# Patient Record
Sex: Female | Born: 1960 | Race: White | Hispanic: No | Smoking: Never smoker
Health system: Southern US, Community
[De-identification: ages and names within clinical notes are randomized; demographics above are authoritative.]

## PROBLEM LIST (undated history)

## (undated) DIAGNOSIS — I10 Essential (primary) hypertension: Secondary | ICD-10-CM

## (undated) DIAGNOSIS — IMO0002 Reserved for concepts with insufficient information to code with codable children: Secondary | ICD-10-CM

## (undated) DIAGNOSIS — G540 Brachial plexus disorders: Secondary | ICD-10-CM

## (undated) DIAGNOSIS — E039 Hypothyroidism, unspecified: Secondary | ICD-10-CM

## (undated) DIAGNOSIS — M797 Fibromyalgia: Secondary | ICD-10-CM

## (undated) DIAGNOSIS — R42 Dizziness and giddiness: Secondary | ICD-10-CM

## (undated) DIAGNOSIS — E538 Deficiency of other specified B group vitamins: Secondary | ICD-10-CM

## (undated) DIAGNOSIS — T7840XA Allergy, unspecified, initial encounter: Secondary | ICD-10-CM

## (undated) DIAGNOSIS — M329 Systemic lupus erythematosus, unspecified: Secondary | ICD-10-CM

## (undated) DIAGNOSIS — E069 Thyroiditis, unspecified: Secondary | ICD-10-CM

## (undated) DIAGNOSIS — R519 Headache, unspecified: Secondary | ICD-10-CM

## (undated) DIAGNOSIS — E785 Hyperlipidemia, unspecified: Secondary | ICD-10-CM

## (undated) DIAGNOSIS — K900 Celiac disease: Secondary | ICD-10-CM

## (undated) DIAGNOSIS — K649 Unspecified hemorrhoids: Secondary | ICD-10-CM

## (undated) DIAGNOSIS — B009 Herpesviral infection, unspecified: Secondary | ICD-10-CM

## (undated) DIAGNOSIS — F32A Depression, unspecified: Secondary | ICD-10-CM

## (undated) DIAGNOSIS — D649 Anemia, unspecified: Secondary | ICD-10-CM

## (undated) DIAGNOSIS — K219 Gastro-esophageal reflux disease without esophagitis: Secondary | ICD-10-CM

## (undated) DIAGNOSIS — F329 Major depressive disorder, single episode, unspecified: Secondary | ICD-10-CM

## (undated) DIAGNOSIS — M1990 Unspecified osteoarthritis, unspecified site: Secondary | ICD-10-CM

## (undated) DIAGNOSIS — S82409A Unspecified fracture of shaft of unspecified fibula, initial encounter for closed fracture: Secondary | ICD-10-CM

## (undated) DIAGNOSIS — Z8669 Personal history of other diseases of the nervous system and sense organs: Secondary | ICD-10-CM

## (undated) DIAGNOSIS — F4310 Post-traumatic stress disorder, unspecified: Secondary | ICD-10-CM

## (undated) DIAGNOSIS — F419 Anxiety disorder, unspecified: Secondary | ICD-10-CM

## (undated) DIAGNOSIS — IMO0001 Reserved for inherently not codable concepts without codable children: Secondary | ICD-10-CM

## (undated) DIAGNOSIS — I83893 Varicose veins of bilateral lower extremities with other complications: Secondary | ICD-10-CM

## (undated) HISTORY — DX: Herpesviral infection, unspecified: B00.9

## (undated) HISTORY — DX: Unspecified hemorrhoids: K64.9

## (undated) HISTORY — DX: Thyroiditis, unspecified: E06.9

## (undated) HISTORY — DX: Unspecified osteoarthritis, unspecified site: M19.90

## (undated) HISTORY — DX: Depression, unspecified: F32.A

## (undated) HISTORY — DX: Essential (primary) hypertension: I10

## (undated) HISTORY — DX: Anemia, unspecified: D64.9

## (undated) HISTORY — DX: Varicose veins of bilateral lower extremities with other complications: I83.893

## (undated) HISTORY — DX: Reserved for inherently not codable concepts without codable children: IMO0001

## (undated) HISTORY — DX: Personal history of other diseases of the nervous system and sense organs: Z86.69

## (undated) HISTORY — DX: Post-traumatic stress disorder, unspecified: F43.10

## (undated) HISTORY — PX: OTHER SURGICAL HISTORY: SHX169

## (undated) HISTORY — DX: Brachial plexus disorders: G54.0

## (undated) HISTORY — DX: Celiac disease: K90.0

## (undated) HISTORY — DX: Dizziness and giddiness: R42

## (undated) HISTORY — DX: Unspecified fracture of shaft of unspecified fibula, initial encounter for closed fracture: S82.409A

## (undated) HISTORY — DX: Major depressive disorder, single episode, unspecified: F32.9

## (undated) HISTORY — DX: Hyperlipidemia, unspecified: E78.5

## (undated) HISTORY — DX: Anxiety disorder, unspecified: F41.9

## (undated) HISTORY — DX: Deficiency of other specified B group vitamins: E53.8

## (undated) HISTORY — DX: Systemic lupus erythematosus, unspecified: M32.9

## (undated) HISTORY — DX: Allergy, unspecified, initial encounter: T78.40XA

## (undated) HISTORY — DX: Headache, unspecified: R51.9

## (undated) HISTORY — DX: Gastro-esophageal reflux disease without esophagitis: K21.9

## (undated) HISTORY — PX: NASAL SINUS SURGERY: SHX719

## (undated) HISTORY — DX: Hypothyroidism, unspecified: E03.9

## (undated) HISTORY — PX: MANDIBLE SURGERY: SHX707

## (undated) HISTORY — PX: FIBULA FRACTURE SURGERY: SHX947

## (undated) HISTORY — DX: Reserved for concepts with insufficient information to code with codable children: IMO0002

## (undated) HISTORY — DX: Fibromyalgia: M79.7

---

## 1996-07-13 HISTORY — PX: ABDOMINAL HYSTERECTOMY: SHX81

## 1997-04-12 HISTORY — PX: TOTAL VAGINAL HYSTERECTOMY: SHX2548

## 1998-08-19 ENCOUNTER — Ambulatory Visit (HOSPITAL_COMMUNITY): Admission: RE | Admit: 1998-08-19 | Discharge: 1998-08-19 | Payer: Self-pay | Admitting: Oral & Maxillofacial Surgery

## 1998-08-19 ENCOUNTER — Encounter: Payer: Self-pay | Admitting: Oral & Maxillofacial Surgery

## 1999-02-13 ENCOUNTER — Emergency Department (HOSPITAL_COMMUNITY): Admission: EM | Admit: 1999-02-13 | Discharge: 1999-02-13 | Payer: Self-pay | Admitting: Emergency Medicine

## 1999-02-13 ENCOUNTER — Encounter: Payer: Self-pay | Admitting: Emergency Medicine

## 1999-05-14 HISTORY — PX: COLONOSCOPY: SHX174

## 1999-05-28 ENCOUNTER — Other Ambulatory Visit: Admission: RE | Admit: 1999-05-28 | Discharge: 1999-05-28 | Payer: Self-pay | Admitting: Internal Medicine

## 1999-05-28 ENCOUNTER — Encounter (INDEPENDENT_AMBULATORY_CARE_PROVIDER_SITE_OTHER): Payer: Self-pay | Admitting: Specialist

## 1999-10-31 ENCOUNTER — Encounter: Admission: RE | Admit: 1999-10-31 | Discharge: 1999-10-31 | Payer: Self-pay | Admitting: Family Medicine

## 1999-10-31 ENCOUNTER — Encounter: Payer: Self-pay | Admitting: Family Medicine

## 2000-05-07 ENCOUNTER — Encounter: Admission: RE | Admit: 2000-05-07 | Discharge: 2000-05-07 | Payer: Self-pay | Admitting: Family Medicine

## 2000-05-07 ENCOUNTER — Encounter: Payer: Self-pay | Admitting: Family Medicine

## 2000-09-27 ENCOUNTER — Encounter: Payer: Self-pay | Admitting: Oral & Maxillofacial Surgery

## 2000-09-29 ENCOUNTER — Observation Stay (HOSPITAL_COMMUNITY): Admission: RE | Admit: 2000-09-29 | Discharge: 2000-09-30 | Payer: Self-pay | Admitting: Oral & Maxillofacial Surgery

## 2000-09-29 ENCOUNTER — Encounter (INDEPENDENT_AMBULATORY_CARE_PROVIDER_SITE_OTHER): Payer: Self-pay | Admitting: Specialist

## 2001-05-19 ENCOUNTER — Encounter: Payer: Self-pay | Admitting: *Deleted

## 2001-05-19 ENCOUNTER — Emergency Department (HOSPITAL_COMMUNITY): Admission: EM | Admit: 2001-05-19 | Discharge: 2001-05-19 | Payer: Self-pay | Admitting: Emergency Medicine

## 2001-08-26 ENCOUNTER — Encounter: Payer: Self-pay | Admitting: Family Medicine

## 2001-08-26 ENCOUNTER — Encounter: Admission: RE | Admit: 2001-08-26 | Discharge: 2001-08-26 | Payer: Self-pay | Admitting: Family Medicine

## 2002-07-13 HISTORY — PX: ESOPHAGOGASTRODUODENOSCOPY: SHX1529

## 2002-08-15 ENCOUNTER — Encounter: Payer: Self-pay | Admitting: Family Medicine

## 2002-08-15 ENCOUNTER — Other Ambulatory Visit: Admission: RE | Admit: 2002-08-15 | Discharge: 2002-08-15 | Payer: Self-pay | Admitting: Family Medicine

## 2002-08-28 ENCOUNTER — Encounter: Payer: Self-pay | Admitting: Family Medicine

## 2002-08-28 ENCOUNTER — Encounter: Admission: RE | Admit: 2002-08-28 | Discharge: 2002-08-28 | Payer: Self-pay | Admitting: Family Medicine

## 2003-10-04 ENCOUNTER — Encounter: Admission: RE | Admit: 2003-10-04 | Discharge: 2003-10-04 | Payer: Self-pay | Admitting: Family Medicine

## 2004-04-17 ENCOUNTER — Ambulatory Visit: Payer: Self-pay

## 2004-05-20 ENCOUNTER — Ambulatory Visit: Payer: Self-pay | Admitting: Neurology

## 2004-05-29 ENCOUNTER — Ambulatory Visit (HOSPITAL_COMMUNITY): Admission: RE | Admit: 2004-05-29 | Discharge: 2004-05-29 | Payer: Self-pay | Admitting: Neurology

## 2004-06-23 ENCOUNTER — Ambulatory Visit: Payer: Self-pay | Admitting: Internal Medicine

## 2004-12-05 ENCOUNTER — Ambulatory Visit: Payer: Self-pay | Admitting: Family Medicine

## 2005-04-08 ENCOUNTER — Ambulatory Visit: Payer: Self-pay | Admitting: Internal Medicine

## 2005-06-05 ENCOUNTER — Encounter: Admission: RE | Admit: 2005-06-05 | Discharge: 2005-06-05 | Payer: Self-pay | Admitting: Family Medicine

## 2005-07-08 ENCOUNTER — Ambulatory Visit: Payer: Self-pay | Admitting: Family Medicine

## 2005-07-15 ENCOUNTER — Ambulatory Visit: Payer: Self-pay | Admitting: Family Medicine

## 2005-08-20 ENCOUNTER — Ambulatory Visit: Payer: Self-pay | Admitting: Family Medicine

## 2005-09-11 ENCOUNTER — Ambulatory Visit: Payer: Self-pay | Admitting: Family Medicine

## 2005-09-25 ENCOUNTER — Emergency Department (HOSPITAL_COMMUNITY): Admission: EM | Admit: 2005-09-25 | Discharge: 2005-09-26 | Payer: Self-pay | Admitting: Emergency Medicine

## 2005-09-27 ENCOUNTER — Emergency Department (HOSPITAL_COMMUNITY): Admission: EM | Admit: 2005-09-27 | Discharge: 2005-09-27 | Payer: Self-pay | Admitting: Emergency Medicine

## 2005-12-02 ENCOUNTER — Ambulatory Visit: Payer: Self-pay | Admitting: Family Medicine

## 2006-02-22 ENCOUNTER — Ambulatory Visit: Payer: Self-pay | Admitting: Family Medicine

## 2006-03-19 ENCOUNTER — Ambulatory Visit: Payer: Self-pay | Admitting: Family Medicine

## 2006-03-19 LAB — CONVERTED CEMR LAB: TSH: 0.18 microintl units/mL

## 2006-05-26 ENCOUNTER — Ambulatory Visit: Payer: Self-pay | Admitting: Family Medicine

## 2006-06-15 ENCOUNTER — Encounter: Admission: RE | Admit: 2006-06-15 | Discharge: 2006-06-15 | Payer: Self-pay | Admitting: Family Medicine

## 2006-07-16 ENCOUNTER — Ambulatory Visit: Payer: Self-pay | Admitting: Family Medicine

## 2006-10-07 ENCOUNTER — Ambulatory Visit: Payer: Self-pay | Admitting: Family Medicine

## 2007-02-02 ENCOUNTER — Ambulatory Visit: Payer: Self-pay | Admitting: Family Medicine

## 2007-02-04 ENCOUNTER — Encounter: Admission: RE | Admit: 2007-02-04 | Discharge: 2007-02-04 | Payer: Self-pay | Admitting: Family Medicine

## 2007-02-15 ENCOUNTER — Ambulatory Visit: Payer: Self-pay | Admitting: Family Medicine

## 2007-02-18 LAB — CONVERTED CEMR LAB
AST: 23 units/L (ref 0–37)
Amylase: 36 units/L (ref 27–131)
Basophils Absolute: 0 10*3/uL (ref 0.0–0.1)
Bilirubin, Direct: 0.1 mg/dL (ref 0.0–0.3)
Eosinophils Relative: 0 % (ref 0.0–5.0)
HCT: 36.3 % (ref 36.0–46.0)
Hemoglobin: 12 g/dL (ref 12.0–15.0)
MCHC: 32.9 g/dL (ref 30.0–36.0)
Monocytes Absolute: 0.6 10*3/uL (ref 0.2–0.7)
Neutrophils Relative %: 54.9 % (ref 43.0–77.0)
RDW: 13.8 % (ref 11.5–14.6)
Total Bilirubin: 0.7 mg/dL (ref 0.3–1.2)
Total Protein: 6.9 g/dL (ref 6.0–8.3)
WBC: 7.4 10*3/uL (ref 4.5–10.5)

## 2007-04-28 ENCOUNTER — Ambulatory Visit: Payer: Self-pay | Admitting: Internal Medicine

## 2007-05-03 ENCOUNTER — Telehealth (INDEPENDENT_AMBULATORY_CARE_PROVIDER_SITE_OTHER): Payer: Self-pay | Admitting: Internal Medicine

## 2007-05-20 ENCOUNTER — Ambulatory Visit: Payer: Self-pay | Admitting: Family Medicine

## 2007-05-20 DIAGNOSIS — B009 Herpesviral infection, unspecified: Secondary | ICD-10-CM | POA: Insufficient documentation

## 2007-05-20 DIAGNOSIS — E785 Hyperlipidemia, unspecified: Secondary | ICD-10-CM

## 2007-05-20 DIAGNOSIS — F329 Major depressive disorder, single episode, unspecified: Secondary | ICD-10-CM

## 2007-05-20 DIAGNOSIS — K219 Gastro-esophageal reflux disease without esophagitis: Secondary | ICD-10-CM

## 2007-05-20 DIAGNOSIS — F3289 Other specified depressive episodes: Secondary | ICD-10-CM | POA: Insufficient documentation

## 2007-05-20 DIAGNOSIS — IMO0001 Reserved for inherently not codable concepts without codable children: Secondary | ICD-10-CM | POA: Insufficient documentation

## 2007-05-20 DIAGNOSIS — J45909 Unspecified asthma, uncomplicated: Secondary | ICD-10-CM

## 2007-05-20 DIAGNOSIS — K649 Unspecified hemorrhoids: Secondary | ICD-10-CM

## 2007-05-20 DIAGNOSIS — E0590 Thyrotoxicosis, unspecified without thyrotoxic crisis or storm: Secondary | ICD-10-CM

## 2007-05-20 DIAGNOSIS — E538 Deficiency of other specified B group vitamins: Secondary | ICD-10-CM | POA: Insufficient documentation

## 2007-05-20 HISTORY — DX: Herpesviral infection, unspecified: B00.9

## 2007-05-20 HISTORY — DX: Gastro-esophageal reflux disease without esophagitis: K21.9

## 2007-05-20 HISTORY — DX: Unspecified asthma, uncomplicated: J45.909

## 2007-05-20 HISTORY — DX: Unspecified hemorrhoids: K64.9

## 2007-05-20 HISTORY — DX: Thyrotoxicosis, unspecified without thyrotoxic crisis or storm: E05.90

## 2007-09-21 ENCOUNTER — Ambulatory Visit: Payer: Self-pay | Admitting: Family Medicine

## 2007-09-21 DIAGNOSIS — F411 Generalized anxiety disorder: Secondary | ICD-10-CM

## 2007-09-21 HISTORY — DX: Generalized anxiety disorder: F41.1

## 2007-12-02 ENCOUNTER — Ambulatory Visit: Payer: Self-pay | Admitting: Family Medicine

## 2007-12-06 LAB — CONVERTED CEMR LAB: ALT: 29 units/L (ref 0–35)

## 2007-12-26 ENCOUNTER — Telehealth: Payer: Self-pay | Admitting: Family Medicine

## 2008-01-02 ENCOUNTER — Encounter: Admission: RE | Admit: 2008-01-02 | Discharge: 2008-01-02 | Payer: Self-pay | Admitting: Family Medicine

## 2008-01-05 ENCOUNTER — Encounter (INDEPENDENT_AMBULATORY_CARE_PROVIDER_SITE_OTHER): Payer: Self-pay | Admitting: *Deleted

## 2008-02-07 ENCOUNTER — Telehealth (INDEPENDENT_AMBULATORY_CARE_PROVIDER_SITE_OTHER): Payer: Self-pay | Admitting: *Deleted

## 2008-03-12 ENCOUNTER — Telehealth (INDEPENDENT_AMBULATORY_CARE_PROVIDER_SITE_OTHER): Payer: Self-pay | Admitting: *Deleted

## 2008-03-13 ENCOUNTER — Telehealth (INDEPENDENT_AMBULATORY_CARE_PROVIDER_SITE_OTHER): Payer: Self-pay | Admitting: *Deleted

## 2008-06-18 ENCOUNTER — Telehealth: Payer: Self-pay | Admitting: Family Medicine

## 2008-07-10 ENCOUNTER — Telehealth: Payer: Self-pay | Admitting: Family Medicine

## 2008-07-16 ENCOUNTER — Encounter: Payer: Self-pay | Admitting: Family Medicine

## 2008-08-22 ENCOUNTER — Ambulatory Visit: Payer: Self-pay | Admitting: Family Medicine

## 2008-08-29 ENCOUNTER — Telehealth (INDEPENDENT_AMBULATORY_CARE_PROVIDER_SITE_OTHER): Payer: Self-pay | Admitting: *Deleted

## 2008-09-17 ENCOUNTER — Ambulatory Visit: Payer: Self-pay | Admitting: Family Medicine

## 2008-09-17 DIAGNOSIS — R5383 Other fatigue: Secondary | ICD-10-CM

## 2008-09-17 DIAGNOSIS — R5381 Other malaise: Secondary | ICD-10-CM

## 2008-09-19 LAB — CONVERTED CEMR LAB
ALT: 19 units/L (ref 0–35)
AST: 20 units/L (ref 0–37)
Basophils Relative: 0.1 % (ref 0.0–3.0)
Bilirubin, Direct: 0.1 mg/dL (ref 0.0–0.3)
CO2: 31 meq/L (ref 19–32)
Calcium: 9.1 mg/dL (ref 8.4–10.5)
Chloride: 103 meq/L (ref 96–112)
Creatinine, Ser: 1 mg/dL (ref 0.4–1.2)
Eosinophils Absolute: 0 10*3/uL (ref 0.0–0.7)
Eosinophils Relative: 0.1 % (ref 0.0–5.0)
Glucose, Bld: 68 mg/dL — ABNORMAL LOW (ref 70–99)
HCT: 39.7 % (ref 36.0–46.0)
Hemoglobin: 13.7 g/dL (ref 12.0–15.0)
MCV: 86.6 fL (ref 78.0–100.0)
Monocytes Absolute: 0.3 10*3/uL (ref 0.1–1.0)
Monocytes Relative: 3.5 % (ref 3.0–12.0)
RBC: 4.59 M/uL (ref 3.87–5.11)
Rhuematoid fact SerPl-aCnc: <20 intl units/mL — ABNORMAL LOW (ref 0.0–20.0)
Sed Rate: 34 mm/hr — ABNORMAL HIGH (ref 0–22)
Total Bilirubin: 0.8 mg/dL (ref 0.3–1.2)
Total Protein: 7.4 g/dL (ref 6.0–8.3)
WBC: 8.8 10*3/uL (ref 4.5–10.5)

## 2008-09-26 ENCOUNTER — Encounter: Payer: Self-pay | Admitting: Family Medicine

## 2008-10-04 ENCOUNTER — Telehealth: Payer: Self-pay | Admitting: Family Medicine

## 2008-11-06 ENCOUNTER — Telehealth: Payer: Self-pay | Admitting: Family Medicine

## 2008-11-07 ENCOUNTER — Encounter: Payer: Self-pay | Admitting: Family Medicine

## 2009-01-16 ENCOUNTER — Telehealth: Payer: Self-pay | Admitting: Family Medicine

## 2009-05-03 ENCOUNTER — Telehealth: Payer: Self-pay | Admitting: Family Medicine

## 2009-05-20 ENCOUNTER — Telehealth: Payer: Self-pay | Admitting: Family Medicine

## 2009-06-20 ENCOUNTER — Ambulatory Visit: Payer: Self-pay | Admitting: Family Medicine

## 2009-06-21 LAB — CONVERTED CEMR LAB
CO2: 30 meq/L (ref 19–32)
Chloride: 102 meq/L (ref 96–112)
Creatinine, Ser: 0.7 mg/dL (ref 0.4–1.2)
Potassium: 4 meq/L (ref 3.5–5.1)
T3, Free: 2.7 pg/mL (ref 2.3–4.2)
T4, Total: 4.5 ug/dL — ABNORMAL LOW (ref 5.0–12.5)

## 2009-07-16 ENCOUNTER — Ambulatory Visit: Payer: Self-pay | Admitting: Family Medicine

## 2009-07-16 DIAGNOSIS — E039 Hypothyroidism, unspecified: Secondary | ICD-10-CM | POA: Insufficient documentation

## 2009-07-26 ENCOUNTER — Telehealth: Payer: Self-pay | Admitting: Family Medicine

## 2009-07-31 ENCOUNTER — Encounter: Payer: Self-pay | Admitting: Family Medicine

## 2009-08-27 ENCOUNTER — Encounter: Payer: Self-pay | Admitting: Family Medicine

## 2009-09-02 ENCOUNTER — Ambulatory Visit: Payer: Self-pay | Admitting: Family Medicine

## 2009-09-05 ENCOUNTER — Encounter: Payer: Self-pay | Admitting: Family Medicine

## 2009-09-05 LAB — CONVERTED CEMR LAB
Free T4: 0.6 ng/dL (ref 0.6–1.6)
TSH: 6.61 microintl units/mL — ABNORMAL HIGH (ref 0.35–5.50)

## 2009-10-17 ENCOUNTER — Encounter (INDEPENDENT_AMBULATORY_CARE_PROVIDER_SITE_OTHER): Payer: Self-pay | Admitting: *Deleted

## 2009-10-30 ENCOUNTER — Ambulatory Visit: Payer: Self-pay | Admitting: Family Medicine

## 2009-11-27 ENCOUNTER — Telehealth: Payer: Self-pay | Admitting: Family Medicine

## 2010-07-01 ENCOUNTER — Encounter: Payer: Self-pay | Admitting: Family Medicine

## 2010-07-11 ENCOUNTER — Ambulatory Visit
Admission: RE | Admit: 2010-07-11 | Discharge: 2010-07-11 | Payer: Self-pay | Source: Home / Self Care | Attending: Family Medicine | Admitting: Family Medicine

## 2010-07-11 ENCOUNTER — Encounter: Payer: Self-pay | Admitting: Family Medicine

## 2010-07-11 DIAGNOSIS — L989 Disorder of the skin and subcutaneous tissue, unspecified: Secondary | ICD-10-CM

## 2010-07-11 DIAGNOSIS — I83893 Varicose veins of bilateral lower extremities with other complications: Secondary | ICD-10-CM

## 2010-07-11 HISTORY — DX: Varicose veins of bilateral lower extremities with other complications: I83.893

## 2010-07-11 HISTORY — DX: Disorder of the skin and subcutaneous tissue, unspecified: L98.9

## 2010-07-17 LAB — CONVERTED CEMR LAB
Alkaline Phosphatase: 72 units/L (ref 39–117)
BUN: 18 mg/dL (ref 6–23)
Basophils Absolute: 0 10*3/uL (ref 0.0–0.1)
Basophils Relative: 0.4 % (ref 0.0–3.0)
Bilirubin, Direct: 0.1 mg/dL (ref 0.0–0.3)
CO2: 28 meq/L (ref 19–32)
Calcium: 9.6 mg/dL (ref 8.4–10.5)
Chloride: 106 meq/L (ref 96–112)
Cholesterol: 184 mg/dL (ref 0–200)
Creatinine, Ser: 0.7 mg/dL (ref 0.4–1.2)
Eosinophils Absolute: 0 10*3/uL (ref 0.0–0.7)
Folate: 3.6 ng/mL
Free T4: 0.73 ng/dL (ref 0.60–1.60)
Glucose, Bld: 79 mg/dL (ref 70–99)
HDL: 35.9 mg/dL — ABNORMAL LOW (ref >39.00)
Lymphocytes Relative: 34 % (ref 12.0–46.0)
MCHC: 33.2 g/dL (ref 30.0–36.0)
MCV: 91.3 fL (ref 78.0–100.0)
Monocytes Absolute: 0.7 10*3/uL (ref 0.1–1.0)
Neutrophils Relative %: 55.2 % (ref 43.0–77.0)
Platelets: 281 10*3/uL (ref 150.0–400.0)
RDW: 15.1 % — ABNORMAL HIGH (ref 11.5–14.6)
Total Bilirubin: 0.7 mg/dL (ref 0.3–1.2)
Total CHOL/HDL Ratio: 5
Total Protein: 6.6 g/dL (ref 6.0–8.3)
Triglycerides: 49 mg/dL (ref 0.0–149.0)
Vitamin B-12: 231 pg/mL (ref 211–911)

## 2010-08-12 NOTE — Progress Notes (Signed)
Summary: ALPRAZOLAM  Phone Note Refill Request Message from:  [facility] [phone] on July 26, 2009 5:55 PM  Refills Requested: Medication #1:  ALPRAZOLAM 0.5 MG  TABS 1 by mouth three times a day as needed anxiety   Last Refilled: 07/08/2009 E-Scribe Request    Method Requested: Telephone to Pharmacy Initial call taken by: Mervin Hack CMA Duncan Dull),  July 26, 2009 5:56 PM  Follow-up for Phone Call        px written on EMR for call in  Follow-up by: Judith Part MD,  July 27, 2009 11:06 AM  Additional Follow-up for Phone Call Additional follow up Details #1::        [facility] Additional Follow-up by: Lowella Petties CMA,  July 29, 2009 9:03 AM    Prescriptions: ALPRAZOLAM 0.5 MG  TABS (ALPRAZOLAM) 1 by mouth three times a day as needed anxiety  #60 x 1   Entered and Authorized by:   Judith Part MD   Signed by:   Judith Part MD on 07/27/2009   Method used:   Telephoned to ...       [facility]  [address] (retail)       [phone] [address]       [place], [place]  [address]       Ph: [phone] or [phone]       Fax: [phone]   RxID:   [id number]

## 2010-08-12 NOTE — Assessment & Plan Note (Signed)
Summary: ASTHMA/CLE   Vital Signs:  Patient profile:   50 year old female Height:      63.25 inches Weight:      198.25 pounds BMI:     34.97 O2 Sat:      95 % on Room air Temp:     99.4 degrees F oral Pulse rate:   76 / minute Pulse rhythm:   regular Resp:     20 per minute BP sitting:   126 / 94  (right arm) Cuff size:   large  Vitals Entered By: Lewanda Rife LPN (September 02, 2009 12:33 PM)  O2 Flow:  Room air  History of Present Illness: symptoms started sat night scratchy throat and then cough which worsened nose runny  last nt cough turned productive - yellow mucous   wheezing and getting tight about 11 am -- just a little bit rescue inhaler handles it well   felt feverish last night - but not bad low grade today  woke up all night long  took tylenol and ibuprofen   no n/v/d   Allergies: 1)  ! Iodine  Past History:  Past Medical History: Last updated: 07/10/2008 Asthma Depression GERD Hyperlipidemia Hyperthyroidism anxiety celiac disease HSV- recurrent rash  Past Surgical History: Last updated: 05/20/2007 7/08 abdominal ultrasound Ovarian cyst Hysterectomy-total (04/1997) Laser surgery- endometriosis Colonoscopy- biopsy neg (05/1999) Sinus surgery, jaw surgery EGD- normal (07/2002) Dexa- normal (08/2002)  Family History: Last updated: 05/20/2007 Father: colon polyps Mother: asthma, HTN, IBS Siblings: 2 sisters- ok  Social History: Last updated: 06/20/2009 Marital Status: divorced Children: none 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 Eyes:  Denies blurring, discharge, and eye irritation. ENT:  Complains of nasal congestion, postnasal drainage, sinus pressure, and sore throat. CV:  Denies chest pain or discomfort and palpitations. Resp:   Complains of cough, shortness of breath, sputum productive, and wheezing; denies pleuritic. GI:  Denies diarrhea, nausea, and vomiting. Derm:  Complains of dryness; denies lesion(s), poor wound healing, and rash. Neuro:  Denies numbness and tingling. Psych:  mood is fairly stable . Endo:  Denies cold intolerance, excessive thirst, excessive urination, and heat intolerance. Heme:  Denies abnormal bruising and bleeding.  Physical Exam  General:  overweight but generally well appearing   Head:  normocephalic, atraumatic, and no abnormalities observed.  no sinus tenderness  Eyes:  vision grossly intact, pupils equal, pupils round, pupils reactive to light, and no injection.   Ears:  R ear normal and L ear normal.   Nose:  nares are boggy and injected  Mouth:  pharynx pink and moist, no erythema, and no exudates.   Neck:  No deformities, masses, or tenderness noted. Lungs:  CTA with scant exp wheeze and no prolonged exp phase  no rales or rhonchi  harsh bs at bases  Heart:  normal rate, regular rhythm, and no murmur.   Skin:  Intact without suspicious lesions or rashes Cervical Nodes:  No lymphadenopathy noted Psych:  normal affect, talkative and pleasant    Impression & Recommendations:  Problem # 1:  BRONCHITIS- ACUTE (ICD-466.0) Assessment New with mild exac of reactive airways  will cover with zithromax andtussionex for cough given short pred taper to fill only if wheeze worsens rev use of inhalers  pt advised to update me if symptoms worsen or do not improve - esp if  wheezing or sob Her updated medication list for this problem includes:    Advair Diskus 100-50 Mcg/dose Aepb (Fluticasone-salmeterol) ..... Use as directed    Proventil Hfa 108 (90 Base) Mcg/act Aers (Albuterol sulfate) ..... Use as needed    Zithromax Z-pak 250 Mg Tabs (Azithromycin) .Marland Kitchen... Take by mouth as directed    Tussionex Pennkinetic Er 8-10 Mg/35ml Lqcr (Chlorpheniramine-hydrocodone) .Marland Kitchen... 1/2 to 1 teaspoon  by mouth up to two times a day as needed cough watch for sedation  Problem # 2:  HYPOTHYROIDISM (ICD-244.9) Assessment: Improved  with declining thyroid fxn after thyroiditis  clinically improved  lab today on current dose  The following medications were removed from the medication list:    Synthroid 88 Mcg Tabs (Levothyroxine sodium) .Marland Kitchen... Take one by mouth daily Her updated medication list for this problem includes:    Cytomel 25 Mcg Tabs (Liothyronine sodium) .Marland Kitchen... Take1/2 by mouth daily    Synthroid 100 Mcg Tabs (Levothyroxine sodium) .Marland Kitchen... 1 by mouth once daily  Labs Reviewed: TSH: 13.96 (06/20/2009)   Total T4: 4.5 (06/20/2009)     Complete Medication List: 1)  Cymbalta 60 Mg Cpep (Duloxetine hcl) .Marland Kitchen.. 1 by mouth twice daily 2)  Claritin 10 Mg Tabs (Loratadine) .... Take one by mouth daily 3)  Cytomel 25 Mcg Tabs (Liothyronine sodium) .... Take1/2 by mouth daily 4)  Lasix 40 Mg Tabs (Furosemide) .... Take one by mouth daily 5)  Dapsone 25 Mg Tabs (Dapsone) .... Take one by mouth weekly now as needed 6)  Alprazolam 0.5 Mg Tabs (Alprazolam) .Marland Kitchen.. 1 by mouth three times a day as needed anxiety 7)  Anusol-hc 2.5 % Crea (Hydrocortisone) .... Apply to affected area at bedtime as needed (do not use for more than 10-14 days in a row) 8)  Orphenadrine Citrate Cr 100 Mg Xr12h-tab (Orphenadrine citrate) .... One by mouth two times a day as needed 9)  Valtrex 500 Mg Tabs (Valacyclovir hcl) .Marland Kitchen.. 1 by mouth two times a day for 7 days as needed for outbreak 10)  Hycosamine 0.125 Mg Sl  .Marland Kitchen.. 1 sublingual every 4 hrs as needed abdominal pain 11)  Advair Diskus 100-50 Mcg/dose Aepb (Fluticasone-salmeterol) .... Use as directed 12)  Flonase 50 Mcg/act Susp (Fluticasone propionate) .... Use as directed 13)  Voltaren 75mg   .... Take one twice daily as needed 14)  Proventil Hfa 108 (90 Base) Mcg/act Aers (Albuterol sulfate) .... Use as needed 15)  Synthroid 100 Mcg Tabs (Levothyroxine sodium) .Marland Kitchen.. 1  by mouth once daily 16)  Zithromax Z-pak 250 Mg Tabs (Azithromycin) .... Take by mouth as directed 17)  Tussionex Pennkinetic Er 8-10 Mg/22ml Lqcr (Chlorpheniramine-hydrocodone) .... 1/2 to 1 teaspoon by mouth up to two times a day as needed cough watch for sedation 18)  Prednisone 10 Mg Tabs (Prednisone) .... Take by mouth as directed  Patient Instructions: 1)  if you need to start the prednisone - take as directed  2)  3 pills once daily for 3 days then  3)  2 pills once daily for 3 days then 4)  1 pill once daily for 3 days and then stop 5)  take zithromax as directed 6)  no change in inhalers  7)  drink lots of fluids 8)  mucinex ok for congestion 9)  use caution with cough syrup - will be sedating  Prescriptions: PREDNISONE 10 MG TABS (PREDNISONE) take by mouth as directed  #18 x 0   Entered and Authorized by:   Judith Part MD  Signed by:   Judith Part MD on 09/02/2009   Method used:   Print then Give to Patient   RxID:   [id number] [facility] ER 8-10 MG/5ML LQCR (CHLORPHENIRAMINE-HYDROCODONE) 1/2 to 1 teaspoon by mouth up to two times a day as needed cough watch for sedation  #8 oz x 0   Entered and Authorized by:   Judith Part MD   Signed by:   Judith Part MD on 09/02/2009   Method used:   Print then Give to Patient   RxID:   [id number] ZITHROMAX Z-PAK 250 MG TABS (AZITHROMYCIN) take by mouth as directed  #1 pack x 0   Entered and Authorized by:   Judith Part MD   Signed by:   Judith Part MD on 09/02/2009   Method used:   Print then Give to Patient   RxID:   [id number]   Current Allergies (reviewed today): ! IODINE

## 2010-08-12 NOTE — Progress Notes (Signed)
Summary: Orphenadrine ER 100mg  refill  Phone Note Refill Request Call back at [phone] Message from:  [facility] on Nov 27, 2009 8:02 AM  Refills Requested: Medication #1:  ORPHENADRINE CITRATE CR 100 MG XR12H-TAB one by mouth two times a day as needed   Last Refilled: 05/20/2009 [facility] [address] electronically requested refill for Orphenadrine ER 100mg .Please advise.    Method Requested: Telephone to Pharmacy Initial call taken by: Lewanda Rife LPN,  Nov 27, 2009 8:03 AM  Follow-up for Phone Call        Medication phoned to [facility] [address] pharmacy as instructed. Lewanda Rife LPN  Nov 27, 2009 10:45 AM     New/Updated Medications: ORPHENADRINE CITRATE CR 100 MG XR12H-TAB (ORPHENADRINE CITRATE) one by mouth two times a day as needed Prescriptions: ORPHENADRINE CITRATE CR 100 MG XR12H-TAB (ORPHENADRINE CITRATE) one by mouth two times a day as needed  #30 x 0   Entered and Authorized by:   Judith Part MD   Signed by:   Lewanda Rife LPN on 45/40/9811   Method used:   Telephoned to ...       [facility]  [address] (retail)       [phone] [address]       [place], [place]  [address]       Ph: [phone] or [phone]       Fax: [phone]   RxID:   [id number]

## 2010-08-12 NOTE — Letter (Signed)
Summary: [facility]  [facility]   Imported By: Lanelle Bal 09/02/2009 13:54:48  _____________________________________________________________________  External Attachment:    Type:   Image     Comment:   External Document

## 2010-08-12 NOTE — Letter (Signed)
Summary: [facility]  [facility]   Imported By: Lanelle Bal 08/06/2009 13:03:40  _____________________________________________________________________  External Attachment:    Type:   Image     Comment:   External Document

## 2010-08-12 NOTE — Letter (Signed)
Summary: [place] No Show Letter  [place] [facility]  [address] [place], [place] [address]   Phone: [phone]  Fax: [phone]    10/17/2009 MRN: [id number]  Morgan [name] [address] [place], [place]  [address]   Dear [name],   Our records indicate that you missed your scheduled appointment with ____lab_________________ on _4.7.11___________.  Please contact this office to reschedule your appointment as soon as possible.  It is important that you keep your scheduled appointments with your physician, so we can provide you the best care possible.  Please be advised that there may be a charge for "no show" appointments.    Sincerely,   [place] [facility]

## 2010-08-12 NOTE — Miscellaneous (Signed)
Summary: Synthroid rx  Medications Added SYNTHROID 112 MCG TABS (LEVOTHYROXINE SODIUM) take one tablet by mouth once daily       Clinical Lists Changes  Medications: Added new medication of SYNTHROID 112 MCG TABS (LEVOTHYROXINE SODIUM) take one tablet by mouth once daily - Signed Removed medication of SYNTHROID 100 MCG TABS (LEVOTHYROXINE SODIUM) 1 by mouth once daily Rx of SYNTHROID 112 MCG TABS (LEVOTHYROXINE SODIUM) take one tablet by mouth once daily;  #30 x 11;  Signed;  Entered by: Lewanda Rife LPN;  Authorized by: Judith Part MD;  Method used: Electronically to [facility] [phone] [address] [place], [place], [place]  [address], Ph: [phone] or [phone], Fax: [phone]    Prescriptions: SYNTHROID 112 MCG TABS (LEVOTHYROXINE SODIUM) take one tablet by mouth once daily  #30 x 11   Entered by:   Lewanda Rife LPN   Authorized by:   Judith Part MD   Signed by:   Lewanda Rife LPN on 95/18/8416   Method used:   Electronically to        [facility] (retail)       [address]       [place], [place]  [address]       Ph: [phone] or [phone]       Fax: [phone]   RxID:   [id number]  Pt has lab appt for 10/17/09 at 12:00noon for tsh.Lewanda Rife LPN  September 05, 2009 2:38 PM  Prior Medications: CYMBALTA 60 MG  CPEP (DULOXETINE HCL) 1 by mouth twice daily CLARITIN 10 MG  TABS (LORATADINE) take one by mouth daily CYTOMEL 25 MCG  TABS (LIOTHYRONINE SODIUM) take1/2 by mouth daily LASIX 40 MG  TABS (FUROSEMIDE) take one by mouth daily DAPSONE 25 MG  TABS (DAPSONE) take one by mouth weekly now as needed ALPRAZOLAM 0.5 MG  TABS (ALPRAZOLAM) 1 by mouth three times a day as needed anxiety ANUSOL-HC 2.5 %  CREA (HYDROCORTISONE) apply to affected area at bedtime as needed (do not use for more than 10-14 days in a row) ORPHENADRINE CITRATE CR 100 MG XR12H-TAB (ORPHENADRINE CITRATE) one by mouth two times a day as  needed VALTREX 500 MG TABS (VALACYCLOVIR HCL) 1 by mouth two times a day for 7 days as needed for outbreak HYCOSAMINE 0.125 MG SL () 1 sublingual every 4 hrs as needed abdominal pain ADVAIR DISKUS 100-50 MCG/DOSE AEPB (FLUTICASONE-SALMETEROL) use as directed FLONASE 50 MCG/ACT SUSP (FLUTICASONE PROPIONATE) use as directed VOLTAREN 75MG  () Take one twice daily as needed PROVENTIL HFA 108 (90 BASE) MCG/ACT AERS (ALBUTEROL SULFATE) Use as needed ZITHROMAX Z-PAK 250 MG TABS (AZITHROMYCIN) take by mouth as directed TUSSIONEX PENNKINETIC ER 8-10 MG/5ML LQCR (CHLORPHENIRAMINE-HYDROCODONE) 1/2 to 1 teaspoon by mouth up to two times a day as needed cough watch for sedation PREDNISONE 10 MG TABS (PREDNISONE) take by mouth as directed SYNTHROID 112 MCG TABS (LEVOTHYROXINE SODIUM) take one tablet by mouth once daily Current Allergies: ! IODINE

## 2010-08-12 NOTE — Assessment & Plan Note (Signed)
Summary: FOLLOW UP ON THYROID/RI   Vital Signs:  Patient profile:   50 year old female Weight:      203 pounds Temp:     99 degrees F oral Pulse rate:   84 / minute Pulse rhythm:   regular BP sitting:   128 / 90  (left arm) Cuff size:   large  Vitals Entered By: Lowella Petties CMA (July 16, 2009 2:07 PM) CC: follow-up visit   History of Present Illness: here for f/u of thyroiditis (Hashimotos) as well as hypothyroidism she was pre seeing Dr Ferdinand Lango and tx with cytomel and synthroid  last labs here - tsh 13.96 elevated, and T4 tot low at 4.5  nl free T3 is generally tired and washed out  also depression and crying easily (esp over the holidays)  her ex husband is helping her a lot  she does see a therapist - she is on cymbalta and also takes xanax a bit more lately as needed  major family problems  is taking up to 3 daily of xanax  has not seen psychiatrist in the past    leg is healing up after fracture  12 weeks of no weight bearing -- just got out of brace and is now doing physical therapy  is doing ok overall with that will have f/u on 26th of the month   needs to get a dexa -- has been a while  last one was 2006 and nl  is on calcium with vitamin D (but cannot do much exercise )   lots of financial problem - cobra ran out / now high risk ins through the state  none of her specialists are on it - unfortunately   has been looking for a job for 2 years - exhausted all her benifits and her unemployment ran out   Allergies: 1)  ! Iodine  Review of Systems General:  Complains of fatigue; denies chills, fever, loss of appetite, and malaise. Eyes:  Denies blurring. CV:  Denies chest pain or discomfort, palpitations, and shortness of breath with exertion. Resp:  Denies cough, shortness of breath, and wheezing. GI:  Denies abdominal pain, change in bowel habits, and indigestion. MS:  Complains of joint pain and stiffness. Derm:  Denies dryness and hair  loss. Neuro:  Denies numbness, tingling, and weakness. Psych:  Complains of anxiety and depression; denies sense of great danger and suicidal thoughts/plans. Endo:  Complains of cold intolerance and weight change; denies excessive thirst, excessive urination, and heat intolerance. Heme:  Denies abnormal bruising and bleeding.  Physical Exam  General:  overweight but generally well appearing   Head:  normocephalic, atraumatic, and no abnormalities observed.   Mouth:  pharynx pink and moist.   Neck:  supple with full rom and no masses or thyromegally, no JVD or carotid bruit  Chest Wall:  No deformities, masses, or tenderness noted. Lungs:  normal respiratory effort, no intercostal retractions, no accessory muscle use, and normal breath sounds.   Heart:  normal rate, regular rhythm, and no murmur.   Extremities:  no CCE  Skin:  Intact without suspicious lesions or rashes Cervical Nodes:  No lymphadenopathy noted Psych:  talks freely about situational stress good eye contact and comm skills not tearful   Impression & Recommendations:  Problem # 1:  HYPOTHYROIDISM (ICD-244.9) Assessment Deteriorated  thyroiditis with gradually declining thyroid fxn cannot afford to see endo currently in light of inc tsh and dec T4-- will inc synthroid to 100 micrograms  once daily and check lab in 6 weeks  hopefully this will imp her energy and depression as well  will eventually need endo ref to check antibodies for hashimotos   Her updated medication list for this problem includes:    Synthroid 88 Mcg Tabs (Levothyroxine sodium) .Marland Kitchen... Take one by mouth daily    Cytomel 25 Mcg Tabs (Liothyronine sodium) .Marland Kitchen... Take1/2 by mouth daily    Synthroid 100 Mcg Tabs (Levothyroxine sodium) .Marland Kitchen... 1 by mouth once daily  Problem # 2:  DEPRESSION (ICD-311) Assessment: Deteriorated worsened lately with some anx -- with major inc in situational stress  adv to continue counseling and cymbalta -- and call when  needs refil of alprazolam  exp imp with thyroid suppl and also when she is able to wt bear on leg again Her updated medication list for this problem includes:    Cymbalta 60 Mg Cpep (Duloxetine hcl) .Marland Kitchen... 1 by mouth twice daily    Alprazolam 0.5 Mg Tabs (Alprazolam) .Marland Kitchen... 1 by mouth three times a day as needed anxiety  Problem # 3:  FRACTURE, FIBULA, RIGHT (ICD-823.81) Assessment: Comment Only overall healing and hopefully will be released to weight bear soon   Complete Medication List: 1)  Cymbalta 60 Mg Cpep (Duloxetine hcl) .Marland Kitchen.. 1 by mouth twice daily 2)  Claritin 10 Mg Tabs (Loratadine) .... Take one by mouth daily 3)  Synthroid 88 Mcg Tabs (Levothyroxine sodium) .... Take one by mouth daily 4)  Cytomel 25 Mcg Tabs (Liothyronine sodium) .... Take1/2 by mouth daily 5)  Lasix 40 Mg Tabs (Furosemide) .... Take one by mouth daily 6)  Dapsone 25 Mg Tabs (Dapsone) .... Take one by mouth weekly now as needed 7)  Alprazolam 0.5 Mg Tabs (Alprazolam) .Marland Kitchen.. 1 by mouth three times a day as needed anxiety 8)  Anusol-hc 2.5 % Crea (Hydrocortisone) .... Apply to affected area at bedtime as needed (do not use for more than 10-14 days in a row) 9)  Orphenadrine Citrate Cr 100 Mg Xr12h-tab (Orphenadrine citrate) .... One by mouth two times a day as needed 10)  Valtrex 500 Mg Tabs (Valacyclovir hcl) .Marland Kitchen.. 1 by mouth two times a day for 7 days as needed for outbreak 11)  Hycosamine 0.125 Mg Sl  .Marland Kitchen.. 1 sublingual every 4 hrs as needed abdominal pain 12)  Advair Diskus 100-50 Mcg/dose Aepb (Fluticasone-salmeterol) .... Use as directed 13)  Flonase 50 Mcg/act Susp (Fluticasone propionate) .... Use as directed 14)  Voltaren 75mg   .... Take one twice daily as needed 15)  Percocet 5-325 Mg Tabs (Oxycodone-acetaminophen) .... Take one every four to six hours as needed 16)  Proventil Hfa 108 (90 Base) Mcg/act Aers (Albuterol sulfate) .... Use as needed 17)  Synthroid 100 Mcg Tabs (Levothyroxine sodium) .Marland Kitchen.. 1 by  mouth once daily  Patient Instructions: 1)  increase synthroid to 100 micrograms daily  2)  no change in cytomel  3)  have pharmacy call when you need xanax refil  4)  schedule non fasting labs in 6 weeks  tsh, free T4 244.9  Prescriptions: SYNTHROID 100 MCG TABS (LEVOTHYROXINE SODIUM) 1 by mouth once daily  #30 x 5   Entered and Authorized by:   Judith Part MD   Signed by:   Judith Part MD on 07/16/2009   Method used:   Print then Give to Patient   RxID:   [id number]   Prior Medications (reviewed today): CYMBALTA 60 MG  CPEP (DULOXETINE HCL) 1 by mouth twice daily  CLARITIN 10 MG  TABS (LORATADINE) take one by mouth daily SYNTHROID 88 MCG  TABS (LEVOTHYROXINE SODIUM) take one by mouth daily CYTOMEL 25 MCG  TABS (LIOTHYRONINE SODIUM) take1/2 by mouth daily LASIX 40 MG  TABS (FUROSEMIDE) take one by mouth daily DAPSONE 25 MG  TABS (DAPSONE) take one by mouth weekly now as needed ALPRAZOLAM 0.5 MG  TABS (ALPRAZOLAM) 1 by mouth three times a day as needed anxiety ANUSOL-HC 2.5 %  CREA (HYDROCORTISONE) apply to affected area at bedtime as needed (do not use for more than 10-14 days in a row) ORPHENADRINE CITRATE CR 100 MG XR12H-TAB (ORPHENADRINE CITRATE) one by mouth two times a day as needed VALTREX 500 MG TABS (VALACYCLOVIR HCL) 1 by mouth two times a day for 7 days as needed for outbreak HYCOSAMINE 0.125 MG SL () 1 sublingual every 4 hrs as needed abdominal pain ADVAIR DISKUS 100-50 MCG/DOSE AEPB (FLUTICASONE-SALMETEROL) use as directed FLONASE 50 MCG/ACT SUSP (FLUTICASONE PROPIONATE) use as directed VOLTAREN 75MG  () Take one twice daily as needed PERCOCET 5-325 MG TABS (OXYCODONE-ACETAMINOPHEN) Take one every four to six hours as needed PROVENTIL HFA 108 (90 BASE) MCG/ACT AERS (ALBUTEROL SULFATE) Use as needed SYNTHROID 100 MCG TABS (LEVOTHYROXINE SODIUM) 1 by mouth once daily Current Allergies: ! IODINE

## 2010-08-14 NOTE — Assessment & Plan Note (Signed)
Summary: NO ENERGY,FEELS RUN DOWN  CYD   Vital Signs:  Patient profile:   51 year old female Height:      63.25 inches Weight:      162 pounds BMI:     28.57 Temp:     98.7 degrees F oral Pulse rate:   76 / minute Pulse rhythm:   regular BP sitting:   124 / 82  (left arm) Cuff size:   large  Vitals Entered By: Lewanda Rife LPN (July 11, 2010 8:49 AM) CC: no energy, feels rundown, bruises easily and sniffles and scratchy throat just started   History of Present Illness: lost 36 lb-- intentionally - gave up candy and junk food and soda -- had a lot of success  no exercise - but planning to start exercise    no energy and feeling run down- 4 weeks also bruising easier -- wonders about anemia  not as much protien in diet  more wt loss  ? from thyroid  mood has been pretty good and work is great  less pain due to wt loss   scratchy throat and sniffly  started latel last night  had been out raking leaves  this am work up with st --nose is more congested  no fever  little dry cough   earlier this week -- tingle down her leg and few bumps on her back -- where she gets her herpes outbreak  will need refil of valtrex   varicose vein on leg -- this is bigger than others usually has spider veins  this one does not hurt  wants to prevent more of them has not worn supp stockings  spot on face - 2  one is oval with scale -- no imp with cortisone cream- no itch  ? what it is     Allergies: 1)  ! Iodine  Past History:  Past Medical History: Asthma Depression GERD Hyperlipidemia Hyperthyroidism anxiety celiac disease HSV- recurrent rash fibula fracture   dermGwen Pounds  Past Surgical History: 7/08 abdominal ultrasound Ovarian cyst Hysterectomy-total (04/1997) Laser surgery- endometriosis Colonoscopy- biopsy neg (05/1999) Sinus surgery, jaw surgery EGD- normal (07/2002) Dexa- normal (08/2002) fibula fracture   Review of Systems General:  Complains of  fatigue; denies chills, fever, and loss of appetite. Eyes:  Denies blurring and eye irritation. ENT:  Complains of nasal congestion, postnasal drainage, and sore throat; denies earache and sinus pressure. CV:  Denies chest pain or discomfort, lightheadness, near fainting, and palpitations. Resp:  Denies cough, shortness of breath, and wheezing. GI:  Denies abdominal pain, change in bowel habits, indigestion, nausea, and vomiting. GU:  Denies discharge, dysuria, and urinary frequency. MS:  Complains of stiffness; denies muscle aches. Derm:  Complains of lesion(s); denies poor wound healing and rash. Neuro:  Denies headaches, numbness, and tingling. Psych:  mood is generally better lately. Endo:  Denies cold intolerance, excessive thirst, excessive urination, and heat intolerance. Heme:  Denies abnormal bruising and bleeding.  Physical Exam  General:  wt loss noted - well appearing  Head:  normocephalic, atraumatic, and no abnormalities observed.  no sinus tenderness  Eyes:  vision grossly intact, pupils equal, pupils round, and pupils reactive to light.  no conjunctival pallor, injection or icterus  Ears:  R ear normal and L ear normal.   Nose:  nares are boggy- some congestion Mouth:  pharynx pink and moist.   Neck:  supple with full rom and no masses or thyromegally, no JVD or carotid bruit  Chest Wall:  No deformities, masses, or tenderness noted. Lungs:  Normal respiratory effort, chest expands symmetrically. Lungs are clear to auscultation, no crackles or wheezes. Heart:  Normal rate and regular rhythm. S1 and S2 normal without gallop, murmur, click, rub or other extra sounds. Abdomen:  Bowel sounds positive,abdomen soft and non-tender without masses, organomegaly or hernias noted. no renal bruits  Msk:  No deformity or scoliosis noted of thoracic or lumbar spine.  no acute joint changes  Pulses:  R and L carotid,radial,femoral,dorsalis pedis and posterior tibial pulses are full and  equal bilaterally Extremities:  spider veins noted below knee one small compressible varicosity- nt , on L calf Neurologic:  sensation intact to light touch, gait normal, and DTRs symmetrical and normal.   Skin:  small vesicle at cleft of buttocks  L cheek and chin .5 cm oval areas of scale-no central clearing  Cervical Nodes:  No lymphadenopathy noted Inguinal Nodes:  No significant adenopathy Psych:  normal affect, talkative and pleasant    Impression & Recommendations:  Problem # 1:  FATIGUE (ICD-780.79) Assessment New may be multifactorial -- assoc with previous weakness?  lab today  disc need to start exercise  mood is good  Orders: TLB-Lipid Panel (80061-LIPID) TLB-BMP (Basic Metabolic Panel-BMET) (80048-METABOL) TLB-CBC Platelet - w/Differential (85025-CBCD) TLB-Hepatic/Liver Function Pnl (80076-HEPATIC) TLB-TSH (Thyroid Stimulating Hormone) (84443-TSH) TLB-B12 + Folate Pnl (82746_82607-B12/FOL) TLB-T4 (Thyrox), Free [phone])  Problem # 2:  HYPOTHYROIDISM (ICD-244.9) Assessment: Deteriorated  pt has prev hyperthyroidism - now hypo ? if hair loss from this or wt loss  is fatigued  lab today Her updated medication list for this problem includes:    Cytomel 25 Mcg Tabs (Liothyronine sodium) .Marland Kitchen... Take1/2 by mouth daily    Synthroid 112 Mcg Tabs (Levothyroxine sodium) .Marland Kitchen... Take one tablet by mouth once daily  Orders: TLB-Lipid Panel (80061-LIPID) TLB-BMP (Basic Metabolic Panel-BMET) (80048-METABOL) TLB-CBC Platelet - w/Differential (85025-CBCD) TLB-Hepatic/Liver Function Pnl (80076-HEPATIC) TLB-TSH (Thyroid Stimulating Hormone) (84443-TSH) TLB-B12 + Folate Pnl [phone]) TLB-T4 (Thyrox), Free [phone])  Labs Reviewed: TSH: 0.63 (10/30/2009)   Free T4: 0.6 (09/02/2009)     Problem # 3:  VITAMIN B12 DEFICIENCY (ICD-266.2) Assessment: Unchanged check level today in light of fatigue Orders: TLB-Lipid Panel (80061-LIPID) TLB-BMP (Basic  Metabolic Panel-BMET) (80048-METABOL) TLB-CBC Platelet - w/Differential (85025-CBCD) TLB-Hepatic/Liver Function Pnl (80076-HEPATIC) TLB-TSH (Thyroid Stimulating Hormone) (84443-TSH) TLB-B12 + Folate Pnl (82746_82607-B12/FOL) TLB-T4 (Thyrox), Free (53664-QI3K)  Problem # 4:  HYPERLIPIDEMIA (ICD-272.4) Assessment: Improved  better diet and wt loss - commended! check lipids - exp imp Orders: TLB-Lipid Panel (80061-LIPID) TLB-BMP (Basic Metabolic Panel-BMET) (80048-METABOL) TLB-CBC Platelet - w/Differential (85025-CBCD) TLB-Hepatic/Liver Function Pnl (80076-HEPATIC) TLB-TSH (Thyroid Stimulating Hormone) (84443-TSH) TLB-B12 + Folate Pnl [phone]) TLB-T4 (Thyrox), Free [phone])  Labs Reviewed: SGOT: 20 (09/17/2008)   SGPT: 19 (09/17/2008)  Problem # 5:  VARICOSE VEINS LOWER EXTREMITIES W/OTH COMPS (ICD-454.8) Assessment: New spider veins and one larger varicosity on L lower leg is mild recommend hose with supp to knee- keep updated   Problem # 6:  SKIN LESION (ICD-709.9) Assessment: New on L chin and cheek oval  scale - ? dermatitis vs mole pt will f/u with derm- Dr Gwen Pounds  Problem # 7:  Hx of HSV (ICD-054.9) Assessment: Deteriorated  small breakout occuring above buttocks refil valtrex for as needed use update if not imp  Orders: Prescription Created Electronically [phone])  Problem # 8:  URI (ICD-465.9) Assessment: New viral and mild with nasal symptoms recommend sympt care- see pt instructions   pt advised to  update me if symptoms worsen or do not improve no signs of bacterial infection at this time  The following medications were removed from the medication list:    Tussionex Pennkinetic Er 8-10 Mg/41ml Lqcr (Chlorpheniramine-hydrocodone) .Marland Kitchen... 1/2 to 1 teaspoon by mouth up to two times a day as needed cough watch for sedation Her updated medication list for this problem includes:    Claritin 10 Mg Tabs (Loratadine) .Marland Kitchen... Take one by mouth  daily  Complete Medication List: 1)  Cymbalta 60 Mg Cpep (Duloxetine hcl) .Marland Kitchen.. 1 by mouth once daily 2)  Claritin 10 Mg Tabs (Loratadine) .... Take one by mouth daily 3)  Cytomel 25 Mcg Tabs (Liothyronine sodium) .... Take1/2 by mouth daily 4)  Lasix 40 Mg Tabs (Furosemide) .... Take one by mouth daily 5)  Dapsone 25 Mg Tabs (Dapsone) .... Take one by mouth weekly now as needed 6)  Alprazolam 0.5 Mg Tabs (Alprazolam) .Marland Kitchen.. 1 by mouth three times a day as needed anxiety 7)  Anusol-hc 2.5 % Crea (Hydrocortisone) .... Apply to affected area at bedtime as needed (do not use for more than 10-14 days in a row) 8)  Valtrex 500 Mg Tabs (Valacyclovir hcl) .Marland Kitchen.. 1 by mouth two times a day for 7 days as needed for outbreak 9)  Hycosamine 0.125 Mg Sl  .Marland Kitchen.. 1 sublingual every 4 hrs as needed abdominal pain 10)  Advair Diskus 100-50 Mcg/dose Aepb (Fluticasone-salmeterol) .... Use as directed 11)  Flonase 50 Mcg/act Susp (Fluticasone propionate) .... Use as directed 12)  Voltaren 75mg   .... Take one twice daily as needed 13)  Proventil Hfa 108 (90 Base) Mcg/act Aers (Albuterol sulfate) .... Use as needed 14)  Synthroid 112 Mcg Tabs (Levothyroxine sodium) .... Take one tablet by mouth once daily  Other Orders: Venipuncture (16109)  Patient Instructions: 1)  follow up with your dermatologist for spots on face 2)  consider stockings with support  3)  labs today  4)  you can try mucinex over the counter twice daily as directed and nasal saline spray for congestion 5)  tylenol over the counter as directed may help with aches, headache and fever 6)  call if symptoms worsen or if not improved in 4-5 days  7)  zinc losenges sometimes help at the beginning of a cold  8)  we will refil valtrex  Prescriptions: VALTREX 500 MG TABS (VALACYCLOVIR HCL) 1 by mouth two times a day for 7 days as needed for outbreak  #14 x 5   Entered and Authorized by:   Judith Part MD   Signed by:   Judith Part MD on  07/11/2010   Method used:   Electronically to        [facility]       [address]       [place], [place]  [address]       Ph: [phone] or [phone]       Fax: [phone]   RxID:   [id number]    Orders Added: 1)  Venipuncture [66440] 2)  TLB-Lipid Panel [80061-LIPID] 3)  TLB-BMP (Basic Metabolic Panel-BMET) [80048-METABOL] 4)  TLB-CBC Platelet - w/Differential [85025-CBCD] 5)  TLB-Hepatic/Liver Function Pnl [80076-HEPATIC] 6)  TLB-TSH (Thyroid Stimulating Hormone) [84443-TSH] 7)  TLB-B12 + Folate Pnl [82746_82607-B12/FOL] 8)  TLB-T4 (Thyrox), Free [34742-VZ5G] 9)  Prescription Created Electronically [G8553] 10)  Est. Patient Level V [38756]    Current Allergies (reviewed  today): ! IODINE

## 2010-08-14 NOTE — Miscellaneous (Signed)
Summary: Controlled Substances Contract  Controlled Substances Contract   Imported By: Maryln Gottron 07/17/2010 14:27:02  _____________________________________________________________________  External Attachment:    Type:   Image     Comment:   External Document

## 2010-08-14 NOTE — Miscellaneous (Signed)
Summary: flu vaccine   Clinical Lists Changes  Observations: Added new observation of FLU VAX: Historical received at [facility] in [place] (06/26/2010 8:18)      Immunization History:  Influenza Immunization History:    Influenza:  [facility] in [place] (06/26/2010)

## 2010-08-28 ENCOUNTER — Other Ambulatory Visit: Payer: Self-pay | Admitting: Endocrinology

## 2010-08-28 DIAGNOSIS — E049 Nontoxic goiter, unspecified: Secondary | ICD-10-CM

## 2010-09-02 ENCOUNTER — Ambulatory Visit
Admission: RE | Admit: 2010-09-02 | Discharge: 2010-09-02 | Disposition: A | Discharge status: Home / Self Care | Payer: 59 | Source: Ambulatory Visit | Attending: Endocrinology | Admitting: Endocrinology

## 2010-09-02 DIAGNOSIS — E049 Nontoxic goiter, unspecified: Secondary | ICD-10-CM

## 2010-11-28 NOTE — H&P (Signed)
[facility]  Patient:    Morgan [name], Morgan [name]                    MRN: [id number] Adm. Date:  09/29/00 Attending:  Dorthula Matas, D.D.S.                         History and Physical  REASON FOR ADMISSION:  Morgan [name] is a 50 year old white female who is well-known to me.  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  She was noted to have maxillary canting, mandibular canting, maxillary asymmetry, and mandibular asymmetry.  After thorough discussion of the problems, the patient was presented options of maxillary and mandibular surgery together, the option of mandibular surgery alone, and the option of maxillary surgery alone.  I explained to her in detail that the combined approach of the maxillary and mandibular surgery would offer the most complete correction of her problem.  The patient was against proceeding with a double-jaw surgery and preferred to do maxillary surgery alone or mandibular surgery alone.  Due to the extent of her maxillary midline being off approximately 3 mm to the left, I felt that she would probably get a better long-term result with maxillary surgery.  I have explained to her the pros and cons of all the different surgical procedures.  At length I discussed with her the maxillary osteotomy and the possible risks involved.  These include but are not limited to the following:  Swelling; bruising; sinus disease; temporomandibular joint pain and/or dysfunction which may require further care; possible need for further orthodontic care and/or surgical care; possible malunion or nonunion of the bone; infection; hemorrhage; intraoral scarring; prismus or limited mouth opening which may be temporary or long-term; possible need for root canal therapy of teeth if they become nonvital; possible loss of soft tissue, bone, and/or teeth; possible enteral or oronasal fistulas.  The patient understands the above  risks and desires to proceed with the planned surgical procedure.  This surgical procedure will be accomplished at [facility] today in the operating room with overnight observation.  ALLERGIES:  Her only significant medical finding is an allergy to IODINE.  PAST MEDICAL HISTORY:  She does have a history of some asthma and irritable bowel syndrome.  She is felt to be satisfactory for the planned surgical procedure as noted by her physician, Dr. Gershon Crane. DD:  09/29/00 TD:  09/29/00 Job: [id number]

## 2011-01-19 ENCOUNTER — Other Ambulatory Visit: Payer: Self-pay | Admitting: *Deleted

## 2011-01-19 ENCOUNTER — Other Ambulatory Visit: Payer: Self-pay | Admitting: Family Medicine

## 2011-01-19 NOTE — Telephone Encounter (Signed)
Cymbalta last filled on 05-21-10 and last time pt seen 06/2010. Is it OK to refill?

## 2011-01-19 NOTE — Telephone Encounter (Signed)
Ok to [employer] Done electronically

## 2011-01-22 NOTE — Telephone Encounter (Signed)
Opened in error

## 2011-06-02 ENCOUNTER — Other Ambulatory Visit: Payer: Self-pay

## 2011-06-02 MED ORDER — ALPRAZOLAM 0.5 MG PO TABS: 0.5000 mg | ORAL_TABLET | Freq: Three times a day (TID) | ORAL | Status: DC | PRN

## 2011-06-02 NOTE — Telephone Encounter (Signed)
Medication phoned to [facility] pharmacy as instructed.

## 2011-06-02 NOTE — Telephone Encounter (Signed)
[facility] faxed refill request Alprazolam 0.5 mg. Pt last seen 07/11/10 and last refilled 07/26/09 for # 60 with one additional refill. Put fax on shelf.

## 2011-06-02 NOTE — Telephone Encounter (Signed)
New pharmacy requesting refil of alprazolam  Has never been there before- [facility] in [place] -she lives there now  Px written for call in   Will put sheet in IN box

## 2011-06-11 ENCOUNTER — Ambulatory Visit (INDEPENDENT_AMBULATORY_CARE_PROVIDER_SITE_OTHER): Payer: Commercial Indemnity | Admitting: Family Medicine

## 2011-06-11 ENCOUNTER — Encounter: Payer: Self-pay | Admitting: Family Medicine

## 2011-06-11 VITALS — BP 134/86 | HR 80 | Temp 98.5°F | Ht 64.0 in | Wt 170.8 lb

## 2011-06-11 DIAGNOSIS — J45909 Unspecified asthma, uncomplicated: Secondary | ICD-10-CM

## 2011-06-11 MED ORDER — FLUTICASONE PROPIONATE 50 MCG/ACT NA SUSP: 2.0000 | Freq: Every day | NASAL | Status: DC

## 2011-06-11 MED ORDER — ALBUTEROL SULFATE (2.5 MG/3ML) 0.083% IN NEBU
2.5000 mg | INHALATION_SOLUTION | Freq: Once | RESPIRATORY_TRACT | Status: AC
  Administered 2011-06-11: 2.5 mg via RESPIRATORY_TRACT

## 2011-06-11 MED ORDER — PREDNISONE 20 MG PO TABS: 40.0000 mg | ORAL_TABLET | Freq: Every day | ORAL | Status: AC

## 2011-06-11 MED ORDER — IPRATROPIUM BROMIDE 0.02 % IN SOLN
0.5000 mg | Freq: Once | RESPIRATORY_TRACT | Status: AC
  Administered 2011-06-11: 0.5 mg via RESPIRATORY_TRACT

## 2011-06-11 NOTE — Patient Instructions (Signed)
I've refilled flonase I think you have asthmatic bronchitis. Treat with continued albuterol scheduled as well as prednisone daily for 7 days. Update [place] if not improving as expected or any fever >101.5, worsening cough.

## 2011-06-11 NOTE — Assessment & Plan Note (Addendum)
Treat with alb/atrovent neb today.  After treatment, subjective improvement but no change in lung exam. Treat with steroid course and scheduled albuterol inhaler. Update [place] if fever returns, worsening productive cough for abx course.

## 2011-06-11 NOTE — Progress Notes (Signed)
  Subjective:    Patient ID: Morgan [name], female    DOB: [birth year], 50 y.o.   MRN: [id number]  HPI CC: cough, ?asthma flare  6d h/o sxs.  Started with ST, then moved into chest.  Cough worse at night, deep.  Mostly dry cough.  ST improved now.  + nasal congestion.  Some sinus pressure HA, taking ibuprofen for this.  Has also been using cough medicine.  + SOB and chest tightness, trouble taking full breath.  Initially with fever to 101, not since.  Did have diarrhea with ST as well.  H/o athma, taking advair bid as well as albuterol inhaler Q4 hours regularly.  No fevers/chills, abd pain, n/v.  No sick contacts at home.  + sick contacts at work.  Husband smokes outside.  Did have asthma flare 2 months ago, treated at [facility] with steroid shot and neb.  Triggers for asthma tend to be allergies, colds, cigarette smoke.  Review of Systems Per HPI    Objective:   Physical Exam  Nursing note and vitals reviewed. Constitutional: She appears well-developed and well-nourished. No distress.       Significant bronchospasm  HENT:  Head: Normocephalic and atraumatic.  Right Ear: Hearing, tympanic membrane, external ear and ear canal normal.  Left Ear: Hearing, tympanic membrane, external ear and ear canal normal.  Nose: No mucosal edema or rhinorrhea. Right sinus exhibits no maxillary sinus tenderness and no frontal sinus tenderness. Left sinus exhibits no maxillary sinus tenderness and no frontal sinus tenderness.  Mouth/Throat: Uvula is midline, oropharynx is clear and moist and mucous membranes are normal. No oropharyngeal exudate, posterior oropharyngeal edema, posterior oropharyngeal erythema or tonsillar abscesses.  Eyes: Conjunctivae and EOM are normal. Pupils are equal, round, and reactive to light. No scleral icterus.  Neck: Normal range of motion. Neck supple.  Cardiovascular: Normal rate, regular rhythm, normal heart sounds and intact distal pulses.   No murmur heard. Pulmonary/Chest:  Effort normal and breath sounds normal. No respiratory distress. She has no wheezes. She has no rales.       Significant bronchospasm cough that takes breath away  Lymphadenopathy:    She has no cervical adenopathy.  Skin: Skin is warm and dry. No rash noted.       Assessment & Plan:

## 2011-06-12 ENCOUNTER — Telehealth: Payer: Self-pay | Admitting: *Deleted

## 2011-06-12 MED ORDER — HYDROCOD POLST-CHLORPHEN POLST 10-8 MG/5ML PO LQCR: 5.0000 mL | Freq: Every evening | ORAL | Status: DC | PRN

## 2011-06-12 NOTE — Telephone Encounter (Signed)
Rx called in as directed. Patient notified.  

## 2011-06-12 NOTE — Telephone Encounter (Signed)
Pt seen yesterday for asthma, bronchitis. Request cough med be called into pharmacy. Has used Tussionex before and it worked well.

## 2011-06-12 NOTE — Telephone Encounter (Signed)
Please phone in tussionex and let pt know. To monitor for returning fevers and let us know if that happens.

## 2011-06-15 ENCOUNTER — Telehealth: Payer: Self-pay | Admitting: Internal Medicine

## 2011-06-15 MED ORDER — BENZONATATE 200 MG PO CAPS: 200.0000 mg | ORAL_CAPSULE | Freq: Three times a day (TID) | ORAL | Status: AC | PRN

## 2011-06-15 NOTE — Telephone Encounter (Signed)
Patient called and stated she saw Dr. Sharen Hones on Wednesday and he Rx Prednisone 20mg  take 2 tablets daily x7days and cough medicine tussinex to take 1 teaspoon at qhs.  She stated it hurts to cough and she still has the cough and her Asthma is still flared up.  She doesn't take the tussinex during the day because it makes her drowsy.  Please advise.

## 2011-06-15 NOTE — Telephone Encounter (Signed)
Patient notified as instructed by telephone.Medication phoned to [facility] [phone]. pharmacy as instructed.

## 2011-06-15 NOTE — Telephone Encounter (Signed)
Lets try tessalon pills during the day - as they will not sedate  Px written for call in   F/u if not improved Also if wheezing worsens

## 2011-06-18 ENCOUNTER — Telehealth: Payer: Self-pay | Admitting: Internal Medicine

## 2011-06-18 NOTE — Telephone Encounter (Signed)
Patient called and stated that she is currently on Cymbalta and her insurance will not pay for this anymore and wanted to know if she could go back taking Prozac which her insurance will pay for.  Please advise.

## 2011-06-18 NOTE — Telephone Encounter (Signed)
That is fine - but epic does not have her last prozac dose -- please ask her what it was and ok to call in for 30 with 11 ref  Let me know if any problems  I will take cymbalta off list now

## 2011-06-19 NOTE — Telephone Encounter (Signed)
Patient notified as instructed by telephone. Pt couldn't remember dose I called [facility] and they could not find where filled. Not in centricity either pt will ck dose and call back on Monday.

## 2011-06-25 NOTE — Telephone Encounter (Signed)
I called pt back and she said insurance covered the Cymbalta so she does not need the Prozac now.

## 2011-07-24 ENCOUNTER — Other Ambulatory Visit: Payer: Self-pay | Admitting: *Deleted

## 2011-07-24 MED ORDER — VALACYCLOVIR HCL 500 MG PO TABS: 500.0000 mg | ORAL_TABLET | Freq: Every day | ORAL | Status: DC | PRN

## 2011-07-24 NOTE — Telephone Encounter (Signed)
She is due for f/u with me - please schedule  Will refill electronically

## 2011-07-28 NOTE — Telephone Encounter (Signed)
Spoke with pt and scheduled a f/u appointment on 08/04/11.

## 2011-08-04 ENCOUNTER — Encounter: Payer: Self-pay | Admitting: Family Medicine

## 2011-08-04 ENCOUNTER — Ambulatory Visit (INDEPENDENT_AMBULATORY_CARE_PROVIDER_SITE_OTHER): Payer: Commercial Indemnity | Admitting: Family Medicine

## 2011-08-04 VITALS — BP 144/94 | HR 80 | Temp 98.3°F | Ht 64.0 in | Wt 177.0 lb

## 2011-08-04 DIAGNOSIS — B354 Tinea corporis: Secondary | ICD-10-CM | POA: Insufficient documentation

## 2011-08-04 DIAGNOSIS — E0590 Thyrotoxicosis, unspecified without thyrotoxic crisis or storm: Secondary | ICD-10-CM

## 2011-08-04 DIAGNOSIS — F329 Major depressive disorder, single episode, unspecified: Secondary | ICD-10-CM

## 2011-08-04 DIAGNOSIS — I10 Essential (primary) hypertension: Secondary | ICD-10-CM | POA: Insufficient documentation

## 2011-08-04 DIAGNOSIS — E538 Deficiency of other specified B group vitamins: Secondary | ICD-10-CM

## 2011-08-04 DIAGNOSIS — E785 Hyperlipidemia, unspecified: Secondary | ICD-10-CM

## 2011-08-04 DIAGNOSIS — F411 Generalized anxiety disorder: Secondary | ICD-10-CM

## 2011-08-04 DIAGNOSIS — E039 Hypothyroidism, unspecified: Secondary | ICD-10-CM

## 2011-08-04 HISTORY — DX: Tinea corporis: B35.4

## 2011-08-04 MED ORDER — CLOTRIMAZOLE-BETAMETHASONE 1-0.05 % EX LOTN: TOPICAL_LOTION | Freq: Two times a day (BID) | CUTANEOUS | Status: DC

## 2011-08-04 MED ORDER — METOPROLOL SUCCINATE ER 50 MG PO TB24: 50.0000 mg | ORAL_TABLET | Freq: Every day | ORAL | Status: DC

## 2011-08-04 NOTE — Progress Notes (Signed)
Subjective:    Patient ID: Morgan [name], female    DOB: May 08, 1961, 51 y.o.   MRN: [id number]  HPI Here for follow up of chronic medical problems  Has been doing ok overall  Put on some weight and going through some more anxiety and depression Also has rash ? Ringworm   Waking up with panic attacks at night more often  Is under a lot of stress at work  Too much work and deadlines-- is a problem   (in [employer] -- is a very busy time of year) Morgan [name] tries to work with her - but not able to hire another person  Can do some work at home Very little down time  Feels a little more like autoimmune issues  More gluten outbreaks lately - blisters even with gluten free diet More cold sores  Having to take her dapsone- has not needed to use in a while   Was on prozac- cymbalta now but ins will not pay for that (originally from Rheum) --  ? If was ever on effexor  Is not seeing a counselor right now -- thinks she should call her    bp is 144/94- a bit high today Thinks due to stress No headache   Wt is up 7 lb with bmi of 30 Not enough time to exercise   She did have uri and bronchitis in fall - on prednisone and ate more  Now is back on track  Trying to get enough sleep -- but waking up with panic --occ takes the xanax   Has had ringworm several times (was feeding ferile cats over the summer) Spot on her jaw on the R hand  Itchy spot on jaw area now ? If that is also fungal or not     Last labs were 1 year ago  Lab Results  Component Value Date   CHOL 184 07/11/2010   HDL 35.90* 07/11/2010   LDLCALC 138* 07/11/2010   TRIG 49.0 07/11/2010   CHOLHDL 5 07/11/2010   diet  B12 def-is not taking any -- ? If she needs to Is tired   Thyroid disorder-- is seeing Morgan [name] at [facility] - on 2 different strengths on alternate days  Numbers were good last time  No change in hair, but is tired   Patient Active Problem List  Diagnoses  . HSV  . HYPERTHYROIDISM  .  HYPOTHYROIDISM  . VITAMIN B12 DEFICIENCY  . HYPERLIPIDEMIA  . ANXIETY  . DEPRESSION  . HEMORRHOIDS  . ASTHMA  . GERD  . FIBROMYALGIA  . WEAKNESS  . VARICOSE VEINS LOWER EXTREMITIES W/OTH COMPS  . SKIN LESION  . Asthmatic bronchitis  . Tinea corporis  . Hypertension, essential, benign   Past Medical History  Diagnosis Date  . Asthma   . Depression   . GERD (gastroesophageal reflux disease)   . HLD (hyperlipidemia)   . Hypothyroidism   . Anxiety   . Celiac disease   . Recurrent HSV (herpes simplex virus)   . Fibula fracture   . Myalgia and myositis, unspecified   . Unspecified hemorrhoids without mention of complication   . Varicose veins of lower extremities with other complications   . Other B-complex deficiencies    Past Surgical History  Procedure Date  . Total vaginal hysterectomy 10/98  . Other surgical history     laser surgery for endometriosis  . Nasal sinus surgery   . Mandible surgery   . Esophagogastroduodenoscopy 1/04  normal  . Colonoscopy 11/00    biopsy negative  . Fibula fracture surgery    History  Substance Use Topics  . Smoking status: Never Smoker   . Smokeless tobacco: Not on file  . Alcohol Use: Yes     Very rare   Family History  Problem Relation Age of Onset  . Colon polyps Father   . Asthma Mother   . Hypertension Mother   . Irritable bowel syndrome Mother    Allergies  Allergen Reactions  . Iodine     REACTION: SOB with injectable iodine  . Iohexol    Current Outpatient Prescriptions on File Prior to Visit  Medication Sig Dispense Refill  . ALPRAZolam (XANAX) 0.5 MG tablet Take 1 tablet (0.5 mg total) by mouth 3 (three) times daily as needed.  60 tablet  0  . dapsone 25 MG tablet Take 25 mg by mouth as needed.       . diclofenac (VOLTAREN) 75 MG EC tablet Take 75 mg by mouth 2 (two) times daily as needed.        . fluticasone (FLONASE) 50 MCG/ACT nasal spray Place 2 sprays into the nose daily.  1 g  6  .  Fluticasone-Salmeterol (ADVAIR) 100-50 MCG/DOSE AEPB Inhale 1 puff into the lungs every 12 (twelve) hours.        Marland Kitchen loratadine (CLARITIN) 10 MG tablet Take 10 mg by mouth daily.        . valACYclovir (VALTREX) 500 MG tablet Take 1 tablet (500 mg total) by mouth daily as needed.  30 tablet  3  . albuterol (PROVENTIL HFA;VENTOLIN HFA) 108 (90 BASE) MCG/ACT inhaler Inhale 2 puffs into the lungs every 6 (six) hours as needed.        . chlorpheniramine-HYDROcodone (TUSSIONEX) 10-8 MG/5ML LQCR Take 5 mLs by mouth at bedtime as needed. Sedation precautions  180 mL  0  . hyoscyamine (LEVSIN SL) 0.125 MG SL tablet Place 0.125 mg under the tongue every 4 (four) hours as needed.                Review of Systems Review of Systems  Constitutional: Negative for fever, appetite change,  and unexpected weight change.pos for fatigue   Eyes: Negative for pain and visual disturbance.  Respiratory: Negative for cough and shortness of breath.   Cardiovascular: Negative for cp or palpitations    Gastrointestinal: Negative for nausea, diarrhea and constipation.  Genitourinary: Negative for urgency and frequency.  Skin: Negative for pallor and pos for rash with itching  Neurological: Negative for weakness, light-headedness, numbness and headaches.  Hematological: Negative for adenopathy. Does not bruise/bleed easily.  Psychiatric/Behavioral: pos for depression and anx/ neg for SI          Objective:   Physical Exam  Constitutional: She appears well-developed and well-nourished. No distress.       overwt and well appearing   HENT:  Head: Normocephalic and atraumatic.  Right Ear: External ear normal.  Left Ear: External ear normal.  Mouth/Throat: Oropharynx is clear and moist.  Eyes: Conjunctivae and EOM are normal. Pupils are equal, round, and reactive to light. No scleral icterus.  Neck: Normal range of motion. Neck supple. No JVD present. Carotid bruit is not present. No thyromegaly present.    Cardiovascular: Normal rate, regular rhythm, normal heart sounds and intact distal pulses.  Exam reveals no gallop.   Pulmonary/Chest: Breath sounds normal. No respiratory distress. She has no wheezes.  Abdominal: Soft. Bowel sounds are normal.  She exhibits no distension, no abdominal bruit and no mass. There is no tenderness.  Musculoskeletal: Normal range of motion. She exhibits no edema and no tenderness.  Lymphadenopathy:    She has no cervical adenopathy.  Neurological: She is alert. She has normal reflexes. She displays no tremor. No cranial nerve deficit. She exhibits normal muscle tone. Coordination normal.  Skin: Skin is warm and dry. No erythema. No pallor.       1 cm area of scale (without central clearing) on R side of face   Psychiatric: Her speech is normal and behavior is normal. Judgment and thought content normal. Her mood appears anxious. Her affect is not blunt, not labile and not inappropriate. She is not agitated, is not hyperactive, not slowed and not withdrawn. Cognition and memory are normal. She exhibits a depressed mood. She expresses no suicidal plans and no homicidal plans.       Good eye contact and comm skills occ a bit tearful          Assessment & Plan:

## 2011-08-04 NOTE — Patient Instructions (Addendum)
Try lotrisone cream for spot on jaw - update if this does not help  See your counselor Stay on cymbalta or check on price of effexor (and let me know)  Try to exercise  Start toprol xl 50 mg once daily -this is for elevated blood pressure and also may help with the panic attacks  Labs today  Follow up in 1-2 months  Keep working with your boss re: job and expectations  Start some B12 over the counter at least 500 mcg daily

## 2011-08-05 LAB — VITAMIN B12: Vitamin B-12: 801 pg/mL (ref 211–911)

## 2011-08-05 LAB — COMPREHENSIVE METABOLIC PANEL
ALT: 17 U/L (ref 0–35)
Albumin: 4 g/dL (ref 3.5–5.2)
Alkaline Phosphatase: 81 U/L (ref 39–117)
Glucose, Bld: 90 mg/dL (ref 70–99)
Potassium: 4.1 mEq/L (ref 3.5–5.1)
Sodium: 140 mEq/L (ref 135–145)
Total Bilirubin: 0.8 mg/dL (ref 0.3–1.2)
Total Protein: 7.6 g/dL (ref 6.0–8.3)

## 2011-08-05 LAB — CBC WITH DIFFERENTIAL/PLATELET
Basophils Absolute: 0 10*3/uL (ref 0.0–0.1)
Eosinophils Relative: 0.1 % (ref 0.0–5.0)
HCT: 40 % (ref 36.0–46.0)
Lymphs Abs: 2.2 10*3/uL (ref 0.7–4.0)
MCV: 91.3 fl (ref 78.0–100.0)
Monocytes Absolute: 0.6 10*3/uL (ref 0.1–1.0)
Monocytes Relative: 6.4 % (ref 3.0–12.0)
Neutrophils Relative %: 68.7 % (ref 43.0–77.0)
Platelets: 316 10*3/uL (ref 150.0–400.0)
RDW: 14 % (ref 11.5–14.6)
WBC: 8.9 10*3/uL (ref 4.5–10.5)

## 2011-08-05 LAB — LIPID PANEL
LDL Cholesterol: 124 mg/dL — ABNORMAL HIGH (ref 0–99)
Total CHOL/HDL Ratio: 4
VLDL: 10.8 mg/dL (ref 0.0–40.0)

## 2011-08-06 NOTE — Assessment & Plan Note (Signed)
Seeing endocrine- alternating doses is working well

## 2011-08-06 NOTE — Assessment & Plan Note (Signed)
Small area R cheek/ face  Is recurrent- exp to cats  tx with lotrisone and update

## 2011-08-06 NOTE — Assessment & Plan Note (Signed)
See assessment for anx No SI

## 2011-08-06 NOTE — Assessment & Plan Note (Signed)
This and hypothyroidism is in good control  Under care of endocrine

## 2011-08-06 NOTE — Assessment & Plan Note (Signed)
Lab today  Disc goals for lipids and reasons to control them Rev labs with pt from last time- diet not as good now  Rev low sat fat diet in detail

## 2011-08-06 NOTE — Assessment & Plan Note (Signed)
Needs to get back on B12  May help energy  Pt agrees

## 2011-08-06 NOTE — Assessment & Plan Note (Signed)
This is worse as is depression lately  Disc med changes - would like to get back to cymbalta if affordable  , but could check on price of effexor if not  Will f/u with counselor  Planned f/u here Disc sympt/ stressors/coping tech/ support/ tx opt and poss side eff in detail >25 min spent with face to face with patient, >50% counseling and/or coordinating care

## 2011-08-06 NOTE — Assessment & Plan Note (Signed)
This is new- ? If age or stress or lifestyle related Disc lifestyle change- diet and exercise  Will try low dose beta blocker - toprol xl- which may also help anx driven palpitations F/u made

## 2011-08-26 ENCOUNTER — Ambulatory Visit (INDEPENDENT_AMBULATORY_CARE_PROVIDER_SITE_OTHER): Payer: Commercial Indemnity | Admitting: Family Medicine

## 2011-08-26 ENCOUNTER — Encounter: Payer: Self-pay | Admitting: Family Medicine

## 2011-08-26 VITALS — BP 110/80 | HR 55 | Temp 98.2°F | Wt 175.8 lb

## 2011-08-26 DIAGNOSIS — L130 Dermatitis herpetiformis: Secondary | ICD-10-CM

## 2011-08-26 DIAGNOSIS — R5383 Other fatigue: Secondary | ICD-10-CM

## 2011-08-26 DIAGNOSIS — R5381 Other malaise: Secondary | ICD-10-CM

## 2011-08-26 DIAGNOSIS — M2550 Pain in unspecified joint: Secondary | ICD-10-CM

## 2011-08-26 DIAGNOSIS — F411 Generalized anxiety disorder: Secondary | ICD-10-CM

## 2011-08-26 HISTORY — DX: Other fatigue: R53.83

## 2011-08-26 HISTORY — DX: Dermatitis herpetiformis: L13.0

## 2011-08-26 HISTORY — DX: Pain in unspecified joint: M25.50

## 2011-08-26 MED ORDER — ALPRAZOLAM 0.5 MG PO TABS: 0.5000 mg | ORAL_TABLET | Freq: Three times a day (TID) | ORAL | Status: DC | PRN

## 2011-08-26 NOTE — Progress Notes (Signed)
Subjective:    Patient ID: Morgan [name], female    DOB: 10/04/1960, 51 y.o.   MRN: [id number]  HPI Has continued to have a flare of aches/ pains/ nausea/ headache and "gluten rash" Saw Dr Gwen Pounds last week - and upped her dapsone and also given a cream (triamcinolone) Dermatitis herpetiformis  Not sleeping well , exhausted and needs time off of work   Is gluten free diet - that should not be   Has not seen rheum in the past - [facility] and [facility]are[facility] Piano  Was going to [employer] with allergist and GI   Had total hyst in past - off HRT for a while   ? Unsure if dep/anx plays any role in her symptoms Tremendous stress at this time- work being the biggest  Is a Copy - works all the time   B12 ok Sees endo for thyroid - ok at last check - tweaked last visit - f/u upcoming   Thinks the cymbalta is helping  Has been a while since counseling - 1 year - would consider going back   Patient Active Problem List  Diagnoses  . HSV  . HYPERTHYROIDISM  . HYPOTHYROIDISM  . VITAMIN B12 DEFICIENCY  . HYPERLIPIDEMIA  . ANXIETY  . DEPRESSION  . HEMORRHOIDS  . ASTHMA  . GERD  . FIBROMYALGIA  . WEAKNESS  . VARICOSE VEINS LOWER EXTREMITIES W/OTH COMPS  . SKIN LESION  . Tinea corporis  . Hypertension, essential, benign  . Fatigue  . Joint pain  . Dermatitis herpetiformis   Past Medical History  Diagnosis Date  . Asthma   . Depression   . GERD (gastroesophageal reflux disease)   . HLD (hyperlipidemia)   . Hypothyroidism   . Anxiety   . Celiac disease   . Recurrent HSV (herpes simplex virus)   . Fibula fracture   . Myalgia and myositis, unspecified   . Unspecified hemorrhoids without mention of complication   . Varicose veins of lower extremities with other complications   . Other B-complex deficiencies    Past Surgical History  Procedure Date  . Total vaginal hysterectomy 10/98  . Other surgical history     laser surgery for endometriosis  . Nasal sinus surgery   .  Mandible surgery   . Esophagogastroduodenoscopy 1/04    normal  . Colonoscopy 11/00    biopsy negative  . Fibula fracture surgery    History  Substance Use Topics  . Smoking status: Never Smoker   . Smokeless tobacco: Not on file  . Alcohol Use: Yes     Very rare   Family History  Problem Relation Age of Onset  . Colon polyps Father   . Asthma Mother   . Hypertension Mother   . Irritable bowel syndrome Mother    Allergies  Allergen Reactions  . Iodine     REACTION: SOB with injectable iodine  . Iohexol    Current Outpatient Prescriptions on File Prior to Visit  Medication Sig Dispense Refill  . albuterol (PROVENTIL HFA;VENTOLIN HFA) 108 (90 BASE) MCG/ACT inhaler Inhale 2 puffs into the lungs every 6 (six) hours as needed.        . chlorpheniramine-HYDROcodone (TUSSIONEX) 10-8 MG/5ML LQCR Take 5 mLs by mouth at bedtime as needed. Sedation precautions  180 mL  0  . clotrimazole-betamethasone (LOTRISONE) lotion Apply topically 2 (two) times daily.  30 mL  0  . dapsone 25 MG tablet Take 25 mg by mouth as needed.       Marland Kitchen  diclofenac (VOLTAREN) 75 MG EC tablet Take 75 mg by mouth 2 (two) times daily as needed.        . DULoxetine (CYMBALTA) 60 MG capsule Take 60 mg by mouth daily.      . fluticasone (FLONASE) 50 MCG/ACT nasal spray Place 2 sprays into the nose daily.  1 g  6  . Fluticasone-Salmeterol (ADVAIR) 100-50 MCG/DOSE AEPB Inhale 1 puff into the lungs every 12 (twelve) hours.        . hyoscyamine (LEVSIN SL) 0.125 MG SL tablet Place 0.125 mg under the tongue every 4 (four) hours as needed.        Marland Kitchen levothyroxine (SYNTHROID, LEVOTHROID) 125 MCG tablet Take one tablet by mouth on Saturday and Sunday.      . levothyroxine (SYNTHROID, LEVOTHROID) 137 MCG tablet Take 1 tablet by mouth daily Monday thru Friday.      . loratadine (CLARITIN) 10 MG tablet Take 10 mg by mouth daily.        . metoprolol succinate (TOPROL-XL) 50 MG 24 hr tablet Take 1 tablet (50 mg total) by mouth daily.  Take with or immediately following a meal.  30 tablet  11  . valACYclovir (VALTREX) 500 MG tablet Take 1 tablet (500 mg total) by mouth daily as needed.  30 tablet  3         Review of Systems Review of Systems  Constitutional: Negative for fever, appetite change,  and unexpected weight change. pos for fatigue Eyes: Negative for pain and visual disturbance.   Respiratory: Negative for cough and shortness of breath.   Cardiovascular: Negative for cp or palpitations    Gastrointestinal: Negative for nausea, diarrhea and constipation.  Genitourinary: Negative for urgency and frequency.  Skin: Negative for pallor and pos for itchy rash MSK pos for joint aches and pains with no joint swelling or redness  Neurological: Negative for weakness, light-headedness, numbness and headaches.  Hematological: Negative for adenopathy. Does not bruise/bleed easily.  Psychiatric/Behavioral: Negative for dysphoric mood. The patient is not nervous/anxious.          Objective:   Physical Exam  Constitutional: She appears well-developed and well-nourished. No distress.       Well appearing but fatigued   HENT:  Head: Normocephalic and atraumatic.  Mouth/Throat: Oropharynx is clear and moist.  Eyes: Conjunctivae and EOM are normal. Pupils are equal, round, and reactive to light. No scleral icterus.  Neck: Normal range of motion. Neck supple. No JVD present. Carotid bruit is not present. No thyromegaly present.  Cardiovascular: Normal rate, regular rhythm, normal heart sounds and intact distal pulses.  Exam reveals no gallop.   Pulmonary/Chest: Breath sounds normal. No respiratory distress. She has no wheezes.  Abdominal: Soft. Bowel sounds are normal. She exhibits no distension and no mass. There is no tenderness.  Musculoskeletal: Normal range of motion. She exhibits tenderness. She exhibits no edema.  Lymphadenopathy:    She has no cervical adenopathy.  Neurological: She is alert. She has normal  reflexes. No cranial nerve deficit. She exhibits normal muscle tone. Coordination normal.  Skin: Skin is warm and dry. No rash noted. No erythema. No pallor.  Psychiatric: She has a normal mood and affect.       Seems somewhat depressed but not tearful          Assessment & Plan:

## 2011-08-26 NOTE — Patient Instructions (Signed)
Go back to your counselor for stress issues Also think about making appt with your rheumatologist also  Off work for 1 week  I hope the rash gets better  Keep me updated

## 2011-08-26 NOTE — Assessment & Plan Note (Signed)
With flare of stress/ exhaustion/ DH rash  Will go back to counseling and consider re visit to rheumatology  Adv low impact exercise Off work until 25th -- to get opportunity for f/u

## 2011-08-26 NOTE — Assessment & Plan Note (Signed)
Flare- for non gluten reason Now more joint pain Will consider return to rheumatology  Also work on stress issues Will continue derm care- dapsone and cortisone cream

## 2011-08-26 NOTE — Assessment & Plan Note (Signed)
Tremendous stress with physical and emotional issues  wil return to counseling Continue cymbalta and xanax prn (not to overuse) Also time off work to get some rest

## 2011-08-26 NOTE — Assessment & Plan Note (Signed)
With severe stress/ anx/ flare of DH also and change in meds Given work note until 2/25 Rev last labs Will see counselor/ endo and poss rheum if needed  Will update

## 2011-08-27 ENCOUNTER — Telehealth: Payer: Self-pay | Admitting: Internal Medicine

## 2011-08-31 ENCOUNTER — Encounter: Payer: Self-pay | Admitting: Internal Medicine

## 2011-08-31 NOTE — Telephone Encounter (Signed)
SPOKE WITH DR Gwen Pounds...PATIENT HAS DERMATITIS HERPETIFORMIS. GI EVALUATION RE POSSIBLE SPRUE. SEEN REMOTELY. HAS AN APPOINTMENT.

## 2011-09-24 ENCOUNTER — Other Ambulatory Visit: Payer: Self-pay | Admitting: *Deleted

## 2011-09-24 MED ORDER — DULOXETINE HCL 60 MG PO CPEP: 60.0000 mg | ORAL_CAPSULE | Freq: Every day | ORAL | Status: DC

## 2011-09-28 ENCOUNTER — Ambulatory Visit: Payer: Commercial Indemnity | Admitting: Family Medicine

## 2011-09-28 ENCOUNTER — Encounter: Payer: Self-pay | Admitting: Internal Medicine

## 2011-09-28 ENCOUNTER — Other Ambulatory Visit (INDEPENDENT_AMBULATORY_CARE_PROVIDER_SITE_OTHER): Payer: Commercial Indemnity

## 2011-09-28 ENCOUNTER — Ambulatory Visit (INDEPENDENT_AMBULATORY_CARE_PROVIDER_SITE_OTHER): Payer: Commercial Indemnity | Admitting: Internal Medicine

## 2011-09-28 VITALS — BP 124/74 | HR 80 | Ht 64.0 in | Wt 188.0 lb

## 2011-09-28 DIAGNOSIS — K900 Celiac disease: Secondary | ICD-10-CM

## 2011-09-28 DIAGNOSIS — Z800 Family history of malignant neoplasm of digestive organs: Secondary | ICD-10-CM

## 2011-09-28 DIAGNOSIS — K589 Irritable bowel syndrome without diarrhea: Secondary | ICD-10-CM

## 2011-09-28 MED ORDER — HYOSCYAMINE SULFATE 0.125 MG SL SUBL: 0.1250 mg | SUBLINGUAL_TABLET | SUBLINGUAL | Status: DC | PRN

## 2011-09-28 NOTE — Patient Instructions (Addendum)
Your physician has requested that you go to the basement for lab work before leaving today  We have sent the following medications to your pharmacy for you to pick up at your convenience:  Levsin

## 2011-09-28 NOTE — Progress Notes (Signed)
HISTORY OF PRESENT ILLNESS:  Morgan [name] is a 51 y.o. 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 She had been evaluated 2004 regarding celiac disease after developing dermatitis herpetiformis. Abnormal celiac serology. Normal upper endoscopy with multiple duodenal biopsies. For the most part, she has done very well on gluten-free diet. She really requires dapsone for breakouts of her dermatitis herpetiformis. However, in January she developed a severe outbreak and is currently on dapsone 100 mg daily. She has also tried topical dapsone cream as well as triamcinolone cream. She denies any change in her gluten-free diet. Medications are stable except for Synthroid which was changed to generic within the past year. She has not had recent antibody testing. She has had somewhat worsening GI symptoms including abdominal discomfort, bloating, and somewhat more loose stools. She did undergo colonoscopy with negative biopsies in 2000. There is a family history of colon cancer in her grandmother and uncle. Father with adenomatous polyps. She just turned 51.  REVIEW OF SYSTEMS:  All non-GI ROS negative except for sinus and allergy trouble, anxiety, arthritis, back pain, depression, fatigue, headaches, itching, muscle cramps, night sweats, shortness of breath, rash, sleeping problems, ankle edema.  Past Medical History  Diagnosis Date  . Asthma   . Depression   . GERD (gastroesophageal reflux disease)   . HLD (hyperlipidemia)   . Hypothyroidism   . Anxiety   . Celiac disease   . Recurrent HSV (herpes simplex virus)   . Fibula fracture   . Myalgia and myositis, unspecified   . Unspecified hemorrhoids without mention of complication   . Varicose veins of lower extremities with other complications   . Other B-complex deficiencies     Past Surgical History  Procedure Date  . Total vaginal hysterectomy  10/98  . Other surgical history     laser surgery for endometriosis  . Nasal sinus surgery   . Mandible surgery   . Esophagogastroduodenoscopy 1/04    normal  . Colonoscopy 11/00    biopsy negative  . Fibula fracture surgery     Social History Morgan [name]  reports that she has never smoked. She has never used smokeless tobacco. She reports that she does not drink alcohol or use illicit drugs.  family history includes Asthma in her mother; Colon cancer in her maternal grandmother; Colon polyps in her father; Hypertension in her mother; Irritable bowel syndrome in her mother; Kidney disease in an unspecified family member; and Uterine cancer in an unspecified family member.  Allergies  Allergen Reactions  . Iodine     REACTION: SOB with injectable iodine  . Iohexol        PHYSICAL EXAMINATION: Vital signs: BP 124/74  Pulse 80  Ht 5\' 4"  (1.626 m)  Wt 188 lb (85.276 kg)  BMI 32.27 kg/m2  Constitutional: generally well-appearing, no acute distress Psychiatric: alert and oriented x3, cooperative Eyes: extraocular movements intact, anicteric, conjunctiva pink Mouth: oral pharynx moist, no lesions Neck: supple no lymphadenopathy Cardiovascular: heart regular rate and rhythm, no murmur Lungs: clear to auscultation bilaterally Abdomen: soft, nontender, nondistended, no obvious ascites, no peritoneal signs, normal bowel sounds, no organomegaly Extremities: no lower extremity edema bilaterally Skin: vesicular lesions on the elbows Neuro: No focal deficits.   ASSESSMENT:  #1. Celiac sprue. Prior positive antibodies with negative duodenal biopsies. Clinical response both dermatologically and in terms of GI symptoms with withdrawal, previously. Worsening of skin condition and  some GI symptoms suggest surreptitious gluten exposure #2. Dermatitis herpetiformis. Recent exacerbation as described #3. IBS. Requests Levsin refill #4. Family history of colon cancer in 2 second-degree  relatives an adenomatous polyps in parent. Appropriate candidate for screening colonoscopy at this time. Discussed.   PLAN:  #1. The patient will investigate each of her medications to make sure there is no gluten in the capsulated portion #2. Celiac panel today as well as serum IgA level. Not clear that upper endoscopy with biopsies would alter management. Discussed with patient. #3. The patient to consider screening colonoscopy.

## 2011-09-29 ENCOUNTER — Encounter: Payer: Self-pay | Admitting: Internal Medicine

## 2011-09-29 LAB — GLIA (IGA/G) + TTG IGA
Gliadin IgA: 74.2 U/mL — ABNORMAL HIGH (ref <20)
Gliadin IgG: 173 U/mL — ABNORMAL HIGH (ref <20)
Tissue Transglutaminase Ab, IgA: 6.8 U/mL (ref <20)

## 2011-10-05 ENCOUNTER — Other Ambulatory Visit: Payer: Self-pay | Admitting: *Deleted

## 2011-10-05 MED ORDER — ALPRAZOLAM 0.5 MG PO TABS: 0.5000 mg | ORAL_TABLET | Freq: Three times a day (TID) | ORAL | Status: DC | PRN

## 2011-10-05 NOTE — Telephone Encounter (Signed)
Received faxed refill request from pharmacy. Is it okay to refill medication? 

## 2011-10-05 NOTE — Telephone Encounter (Signed)
Px written for call in   

## 2011-10-06 NOTE — Telephone Encounter (Signed)
Rx called to [facility].

## 2011-11-06 ENCOUNTER — Ambulatory Visit (AMBULATORY_SURGERY_CENTER): Payer: Commercial Indemnity | Admitting: *Deleted

## 2011-11-06 VITALS — Ht 64.0 in | Wt 186.0 lb

## 2011-11-06 DIAGNOSIS — K900 Celiac disease: Secondary | ICD-10-CM

## 2011-11-06 DIAGNOSIS — Z1211 Encounter for screening for malignant neoplasm of colon: Secondary | ICD-10-CM

## 2011-11-06 MED ORDER — PEG-KCL-NACL-NASULF-NA ASC-C 100 G PO SOLR: ORAL | Status: DC

## 2011-11-18 ENCOUNTER — Ambulatory Visit (AMBULATORY_SURGERY_CENTER): Payer: Managed Care, Other (non HMO) | Admitting: Internal Medicine

## 2011-11-18 ENCOUNTER — Encounter: Payer: Self-pay | Admitting: Internal Medicine

## 2011-11-18 VITALS — BP 164/101 | HR 106 | Temp 98.6°F | Resp 16 | Ht 63.0 in | Wt 185.0 lb

## 2011-11-18 DIAGNOSIS — K900 Celiac disease: Secondary | ICD-10-CM

## 2011-11-18 DIAGNOSIS — Z1211 Encounter for screening for malignant neoplasm of colon: Secondary | ICD-10-CM

## 2011-11-18 DIAGNOSIS — Z800 Family history of malignant neoplasm of digestive organs: Secondary | ICD-10-CM

## 2011-11-18 DIAGNOSIS — D1330 Benign neoplasm of unspecified part of small intestine: Secondary | ICD-10-CM

## 2011-11-18 MED ORDER — SODIUM CHLORIDE 0.9 % IV SOLN: 500.0000 mL | INTRAVENOUS | Status: DC

## 2011-11-18 NOTE — Progress Notes (Signed)
Patient did not experience any of the following events: a burn prior to discharge; a fall within the facility; wrong site/side/patient/procedure/implant event; or a hospital transfer or hospital admission upon discharge from the facility. (G8907) Patient did not have preoperative order for IV antibiotic SSI prophylaxis. (G8918)  

## 2011-11-18 NOTE — Op Note (Signed)
[facility] [address] [place], [place]  [address]  ENDOSCOPY PROCEDURE REPORT  PATIENT:  Morgan [name], Morgan [name]  MR#:  [id number] BIRTHDATE:  Feb 20, 1961, 51 yrs. old  GENDER:  female  ENDOSCOPIST:  Wilhemina Bonito. Eda Keys, MD Referred by:  Office  PROCEDURE DATE:  11/18/2011 PROCEDURE:  EGD with biopsy, 43239 ASA CLASS:  Class II INDICATIONS:  small bowel biopsy to rule out celiac sprue ; dermatitis herpetaformis refractory to rx and gluten free diet  MEDICATIONS:   MAC sedation, administered by CRNA, propofol (Diprivan) 200 mg IV TOPICAL ANESTHETIC:  none  DESCRIPTION OF PROCEDURE:   After the risks benefits and alternatives of the procedure were thoroughly explained, informed consent was obtained.  The LB [id number] [id number] endoscope was introduced through the mouth and advanced to the third portion of the duodenum, without limitations.  The instrument was slowly withdrawn as the mucosa was fully examined. <<PROCEDUREIMAGES>>  The upper, middle, and distal third of the esophagus were carefully inspected and no abnormalities were noted. The z-line was well seen at the GEJ. The endoscope was pushed into the fundus which was normal including a retroflexed view. The antrum,gastric body, first and second / thirdpart of the duodenum were unremarkable. Biopsies x 6 (2 each portion of duodenum) Retroflexed views revealed no abnormalities.    The scope was then withdrawn from the patient and the procedure completed.  COMPLICATIONS:  None  ENDOSCOPIC IMPRESSION: 1) Normal EGD  RECOMMENDATIONS: 1) Await biopsy results 2) CONTINUE GLUTEN FREE DIET  ______________________________ Wilhemina Bonito. Eda Keys, MD  CC:  The Patient;  Judy Pimple, MD;  Magdalene Patricia [facility])  n. Rosalie DoctorWilhemina Bonito. Eda Keys at 11/18/2011 02:56 PM  Dorene Grebe, [id number]

## 2011-11-18 NOTE — Patient Instructions (Signed)
YOU HAD AN ENDOSCOPIC PROCEDURE TODAY AT THE [facility]: Refer to the procedure report that was given to you for any specific questions about what was found during the examination.  If the procedure report does not answer your questions, please call your gastroenterologist to clarify.  If you requested that your care partner not be given the details of your procedure findings, then the procedure report has been included in a sealed envelope for you to review at your convenience later.  YOU SHOULD EXPECT: Some feelings of bloating in the abdomen. Passage of more gas than usual.  Walking can help get rid of the air that was put into your GI tract during the procedure and reduce the bloating. If you had a lower endoscopy (such as a colonoscopy or flexible sigmoidoscopy) you may notice spotting of blood in your stool or on the toilet paper. If you underwent a bowel prep for your procedure, then you may not have a normal bowel movement for a few days.  DIET: Your first meal following the procedure should be a light meal and then it is ok to progress to your normal diet.  A half-sandwich or bowl of soup is an example of a good first meal.  Heavy or fried foods are harder to digest and may make you feel nauseous or bloated.  Likewise meals heavy in dairy and vegetables can cause extra gas to form and this can also increase the bloating.  Drink plenty of fluids but you should avoid alcoholic beverages for 24 hours.  ACTIVITY: Your care partner should take you home directly after the procedure.  You should plan to take it easy, moving slowly for the rest of the day.  You can resume normal activity the day after the procedure however you should NOT DRIVE or use heavy machinery for 24 hours (because of the sedation medicines used during the test).    SYMPTOMS TO REPORT IMMEDIATELY: A gastroenterologist can be reached at any hour.  During normal business hours, 8:30 AM to 5:00 PM Monday through Friday,  call [phone].  After hours and on weekends, please call the GI answering service at [phone] who will take a message and have the physician on call contact you.   Following lower endoscopy (colonoscopy or flexible sigmoidoscopy):  Excessive amounts of blood in the stool  Significant tenderness or worsening of abdominal pains  Swelling of the abdomen that is new, acute  Fever of 100F or higher  Following upper endoscopy (EGD)  Vomiting of blood or coffee ground material  New chest pain or pain under the shoulder blades  Painful or persistently difficult swallowing  New shortness of breath  Fever of 100F or higher  Black, tarry-looking stools  FOLLOW UP: If any biopsies were taken you will be contacted by phone or by letter within the next 1-3 weeks.  Call your gastroenterologist if you have not heard about the biopsies in 3 weeks.  Our staff will call the home number listed on your records the next business day following your procedure to check on you and address any questions or concerns that you may have at that time regarding the information given to you following your procedure. This is a courtesy call and so if there is no answer at the home number and we have not heard from you through the emergency physician on call, we will assume that you have returned to your regular daily activities without incident.  SIGNATURES/CONFIDENTIALITY: You and/or your care   partner have signed paperwork which will be entered into your electronic medical record.  These signatures attest to the fact that that the information above on your After Visit Summary has been reviewed and is understood.  Full responsibility of the confidentiality of this discharge information lies with you and/or your care-partner.  

## 2011-11-18 NOTE — Op Note (Signed)
[facility] [address] [place], [place]  [address]  COLONOSCOPY PROCEDURE REPORT  PATIENT:  Morgan, [name]  MR#:  [id number] BIRTHDATE:  [birth year], 51 yrs. old  GENDER:  female ENDOSCOPIST:  Wilhemina Bonito. Eda Keys, MD REF. BY:  Office PROCEDURE DATE:  11/18/2011 PROCEDURE:  Higher-risk screening colonoscopy G0105  ASA CLASS:  Class II INDICATIONS:  Screening, family history of colon cancer ( 2 second degree relatives), family Hx of polyps (dad) MEDICATIONS:   MAC sedation, administered by CRNA, propofol (Diprivan) 450 mg IV  DESCRIPTION OF PROCEDURE:   After the risks benefits and alternatives of the procedure were thoroughly explained, informed consent was obtained.  Digital rectal exam was performed and revealed no abnormalities.   The LB CF-[id number] endoscope was introduced through the anus and advanced to the cecum, which was identified by both the appendix and ileocecal valve, without limitations.  The quality of the prep was good, using MoviPrep. The instrument was then slowly withdrawn as the colon was fully examined. <<PROCEDUREIMAGES>>  FINDINGS:  A normal appearing cecum, ileocecal valve, and appendiceal orifice were identified. The ascending, hepatic flexure, transverse, splenic flexure, descending, sigmoid colon, and rectum appeared unremarkable.  No polyps or cancers were seen. Retroflexed views in the rectum revealed no abnormalities.    The time to cecum = 5:54  minutes. The scope was then withdrawn in 11:04  minutes from the cecum and the procedure completed.  COMPLICATIONS:  None  ENDOSCOPIC IMPRESSION: 1) Normal colon 2) No polyps or cancers  RECOMMENDATIONS: 1) Continue current colorectal screening recommendations for "routine risk" patients with a repeat colonoscopy in 10 years. 2) Upper endoscopy today  ______________________________ Wilhemina Bonito. Eda Keys, MD  CC:  The Patient;   Judy Pimple, MD  n. Rosalie DoctorWilhemina Bonito. Eda Keys at  11/18/2011 02:41 PM  Dorene Grebe, [phone]

## 2011-11-19 ENCOUNTER — Telehealth: Payer: Self-pay | Admitting: *Deleted

## 2011-11-19 NOTE — Telephone Encounter (Signed)
  Follow up Call-  Call back number 11/18/2011  Post procedure Call Back phone  # [phone]  Permission to leave phone message Yes     Patient questions:  Do you have a fever, pain , or abdominal swelling? no Pain Score  0 *  Have you tolerated food without any problems? yes  Have you been able to return to your normal activities? yes  Do you have any questions about your discharge instructions: Diet   no Medications  no Follow up visit  no  Do you have questions or concerns about your Care? no  Actions: * If pain score is 4 or above: No action needed, pain <4.

## 2011-11-23 ENCOUNTER — Other Ambulatory Visit: Payer: Self-pay

## 2011-11-23 MED ORDER — ALPRAZOLAM 0.5 MG PO TABS: 0.5000 mg | ORAL_TABLET | Freq: Three times a day (TID) | ORAL | Status: DC | PRN

## 2011-11-23 NOTE — Telephone Encounter (Signed)
Since xanax is habit forming I really want her to use only if absolutely necessary Px written for call in

## 2011-11-23 NOTE — Telephone Encounter (Signed)
Rx called to [facility], patient advised as instructed via telephone.  She wanted to Dr. Milinda Antis to know that she is seeing Dr. Charlynn Grimes in our office this week.

## 2011-11-23 NOTE — Telephone Encounter (Signed)
See med refil- I may have closed it instead of routing--thanks

## 2011-11-23 NOTE — Telephone Encounter (Signed)
Pt left v/m requesting refill alprazolam 0.5 mg #90 sent to [facility] . Pt has been taking one tab three times every day due to panic and anxiety issues she is going thru now. Pt has appt with Dr Laymond Purser 11/25/11. Pt can be reached [phone].

## 2011-11-24 ENCOUNTER — Encounter: Payer: Self-pay | Admitting: Internal Medicine

## 2011-11-25 ENCOUNTER — Ambulatory Visit (INDEPENDENT_AMBULATORY_CARE_PROVIDER_SITE_OTHER): Payer: PRIVATE HEALTH INSURANCE | Admitting: Psychology

## 2011-11-25 DIAGNOSIS — F331 Major depressive disorder, recurrent, moderate: Secondary | ICD-10-CM

## 2011-11-27 ENCOUNTER — Telehealth: Payer: Self-pay

## 2011-11-27 NOTE — Telephone Encounter (Signed)
Pt called back and uses [facility] at [phone]. Pt taking Cymbalta for fibromyalgia and would like med for depression to add to Cymbalta. Pt can be reached at [phone].

## 2011-11-27 NOTE — Telephone Encounter (Signed)
Pt left v/m pt has seen Dr Laymond Purser and Dr Laymond Purser was to talk with Dr Milinda Antis about med for depression. Pt feeling extremely depressed and Cymbalta is not taking care of depression. Pt request another med for depression. Pt left contact # [phone]. I called pt at contact # and home # and left v/m for pt to call back. (pt left no pharmacy information with her v/m).

## 2011-11-29 NOTE — Telephone Encounter (Signed)
Please have her f/u with me to discuss her symptoms and make a plan - 30 min if possible, thanks

## 2011-11-30 ENCOUNTER — Encounter: Payer: Self-pay | Admitting: Family Medicine

## 2011-11-30 ENCOUNTER — Ambulatory Visit (INDEPENDENT_AMBULATORY_CARE_PROVIDER_SITE_OTHER): Payer: Commercial Indemnity | Admitting: Family Medicine

## 2011-11-30 VITALS — BP 130/90 | HR 71 | Temp 98.4°F | Ht 64.0 in | Wt 188.8 lb

## 2011-11-30 DIAGNOSIS — F329 Major depressive disorder, single episode, unspecified: Secondary | ICD-10-CM

## 2011-11-30 MED ORDER — BUPROPION HCL ER (XL) 150 MG PO TB24: 150.0000 mg | ORAL_TABLET | Freq: Every day | ORAL | Status: DC

## 2011-11-30 NOTE — Progress Notes (Signed)
Subjective:    Patient ID: Morgan [name], female    DOB: 1960-09-20, 51 y.o.   MRN: [id number]  HPI Not doing well with depression  After last visit took "downward spiral" and kept going  Saw Dr Dareen Piano -- rheum -- and he started him on vit D , then saw Dr Jorge Mandril (ortho) - he checked ANA on her  ANA was high - sent to new rheum Dr Jon Billings- did complete work up on her -- which was reassuring Told her fibromyalgia was flared  This has caused tremendous amount of pain  Lots of muscle spasms -- going to physical therapy   (also nerve conduction and also MRI)  Has to use cane for her R side  Weak due to pain   Out of work and ran out of [employer]  On leave now and not working or being paid  Started water exercise at [employer]   Also dog died in the midst of this - hard on her   Saw Dr Laymond Purser last week - thought she may benefit from additional med for depression   Is in a deep dark hole - hard to crawl out of   Has had depression in the past  In early 20s had an intpt stay for bad bout of [place] in [place] , [facility]  No SI or suicide attempt now or ever   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 HSV  . HYPERTHYROIDISM  . HYPOTHYROIDISM  . VITAMIN B12 DEFICIENCY  . HYPERLIPIDEMIA  . ANXIETY  . DEPRESSION  . HEMORRHOIDS  . ASTHMA  . GERD  . FIBROMYALGIA  . WEAKNESS  . VARICOSE VEINS LOWER EXTREMITIES W/OTH COMPS  . SKIN LESION  . Tinea corporis  . Hypertension, essential, benign  . Fatigue  . Joint pain  . Dermatitis herpetiformis  . Back pain  . Leg pain  . Neck pain  . Left shoulder pain  . History of balance disorder   Past Medical History  Diagnosis Date  . Asthma     . Depression   . GERD (gastroesophageal reflux disease)   . HLD (hyperlipidemia)   . Hypothyroidism   . Anxiety   . Celiac disease   . Recurrent HSV (herpes simplex virus)   . Fibula fracture   . Unspecified hemorrhoids without mention of complication   . Varicose veins of lower extremities with other complications   . Other B-complex deficiencies   . Hypertension   . Myalgia and myositis, unspecified   . Allergy     seasonal  . Arthritis    Past Surgical History  Procedure Date  . Total vaginal hysterectomy 10/98  . Other surgical history     laser surgery for endometriosis  . Nasal sinus surgery   . Mandible surgery   . Esophagogastroduodenoscopy 1/04    normal  . Colonoscopy 11/00    biopsy negative  . Fibula fracture surgery    History  Substance Use Topics  . Smoking status: Never Smoker   . Smokeless tobacco: Never Used  . Alcohol Use: No     Very rare   Family History  Problem Relation Age of  Onset  . Colon polyps Father   . Hypertension Father   . Asthma Mother   . Hypertension Mother   . Irritable bowel syndrome Mother   . Colon cancer Maternal Grandmother   . Uterine cancer    . Kidney disease     Allergies  Allergen Reactions  . Iodine     REACTION: SOB with injectable iodine  . Iohexol   . Klonopin (Clonazepam)     Over sedated    Current Outpatient Prescriptions on File Prior to Visit  Medication Sig Dispense Refill  . albuterol (PROVENTIL HFA;VENTOLIN HFA) 108 (90 BASE) MCG/ACT inhaler Inhale 2 puffs into the lungs every 6 (six) hours as needed.        . ALPRAZolam (XANAX) 0.5 MG tablet Take 1 tablet (0.5 mg total) by mouth 3 (three) times daily as needed.  90 tablet  0  . baclofen (LIORESAL) 10 MG tablet Take 1 tablet by mouth Twice daily as needed.      . dapsone 25 MG tablet Take 25 mg by mouth as needed.       . DULoxetine (CYMBALTA) 60 MG capsule Take 1 capsule (60 mg total) by mouth daily.  60 capsule  6  . fluticasone (FLONASE) 50  MCG/ACT nasal spray Place 2 sprays into the nose daily.  1 g  6  . Fluticasone-Salmeterol (ADVAIR) 100-50 MCG/DOSE AEPB Inhale 1 puff into the lungs every 12 (twelve) hours.        Marland Kitchen HYDROcodone-acetaminophen (NORCO) 7.5-325 MG per tablet Take 0.5 tablets by mouth Every 4 hours as needed.      . hyoscyamine (LEVSIN SL) 0.125 MG SL tablet Place 1 tablet (0.125 mg total) under the tongue every 4 (four) hours as needed.  30 tablet  6  . levothyroxine (SYNTHROID, LEVOTHROID) 125 MCG tablet Take one tablet by mouth on Saturday and Sunday.      . levothyroxine (SYNTHROID, LEVOTHROID) 137 MCG tablet Take 1 tablet by mouth daily Monday thru Friday.      . loratadine (CLARITIN) 10 MG tablet Take 10 mg by mouth daily.        . meloxicam (MOBIC) 7.5 MG tablet Take 7.5 mg by mouth 2 (two) times daily as needed.       . metoprolol succinate (TOPROL-XL) 50 MG 24 hr tablet Take 1 tablet (50 mg total) by mouth daily. Take with or immediately following a meal.  30 tablet  11  . valACYclovir (VALTREX) 500 MG tablet Take 1 tablet (500 mg total) by mouth daily as needed.  30 tablet  3  . Vitamin D, Ergocalciferol, (DRISDOL) 50000 UNITS CAPS Take 1 capsule by mouth Once a week.      Marland Kitchen buPROPion (WELLBUTRIN XL) 150 MG 24 hr tablet Take 1 tablet (150 mg total) by mouth daily.  30 tablet  11       Review of Systems Review of Systems  Constitutional: Negative for fever, appetite change, and unexpected weight change. pos for extreme fatigue  Eyes: Negative for pain and visual disturbance.  Respiratory: Negative for cough and shortness of breath.   Cardiovascular: Negative for cp or palpitations    Gastrointestinal: Negative for nausea, diarrhea and constipation.  Genitourinary: Negative for urgency and frequency.  Skin: Negative for pallor and pos for rash  MSK pos for mod to severe muscle and joint pain - without joint swelling Neurological: Negative for weakness, light-headedness, numbness and headaches.    Hematological: Negative for adenopathy. Does not bruise/bleed easily.  Psychiatric/Behavioral:pos for depression with vegetative mood, pos for anxiety controlled by xanax .          Objective:   Physical Exam  Constitutional: She appears well-developed and well-nourished. No distress.       overwt and well appearing   HENT:  Head: Normocephalic and atraumatic.  Mouth/Throat: Oropharynx is clear and moist. No oropharyngeal exudate.  Eyes: Conjunctivae and EOM are normal. Pupils are equal, round, and reactive to light. Right eye exhibits no discharge. Left eye exhibits no discharge.  Neck: Normal range of motion. Neck supple. No JVD present. Carotid bruit is not present. No thyromegaly present.  Cardiovascular: Normal rate, regular rhythm, normal heart sounds and intact distal pulses.  Exam reveals no gallop.   Pulmonary/Chest: Effort normal and breath sounds normal. No respiratory distress. She has no wheezes.  Abdominal: Soft. Bowel sounds are normal. She exhibits no distension, no abdominal bruit and no mass. There is no tenderness.  Musculoskeletal: Normal range of motion. She exhibits tenderness. She exhibits no edema.       Myofascial tender point diffusely without joint swelling or redness   Lymphadenopathy:    She has no cervical adenopathy.  Neurological: She is alert. She has normal reflexes. No cranial nerve deficit. She exhibits normal muscle tone. Coordination normal.  Skin: Skin is warm and dry. No rash noted. No erythema. No pallor.  Psychiatric: Judgment normal. Her mood appears anxious. Her affect is blunt. Her affect is not inappropriate. Her speech is delayed. Her speech is not tangential and not slurred. She is slowed and withdrawn. Thought content is not delusional. Cognition and memory are normal. She exhibits a depressed mood. She expresses no homicidal and no suicidal ideation. She expresses no suicidal plans and no homicidal plans.       occ tearful when disc her  pain and medical conditions  She is attentive.          Assessment & Plan:

## 2011-11-30 NOTE — Patient Instructions (Signed)
Start the wellbutrin once daily  Keep working on gradual exercise program Ask insurance if xanax xr would be covered  Follow up with me in 4-6 weeks Keep seeing counselor as long as you can If side effects or worse let me know

## 2011-11-30 NOTE — Telephone Encounter (Signed)
Patient advised as instructed via telephone, f/u appt scheduled for this afternoon at 3:30.

## 2011-12-02 ENCOUNTER — Ambulatory Visit: Payer: Commercial Indemnity

## 2011-12-04 ENCOUNTER — Encounter: Payer: Self-pay | Admitting: Physical Medicine and Rehabilitation

## 2011-12-04 ENCOUNTER — Encounter
Payer: Commercial Indemnity | Attending: Physical Medicine and Rehabilitation | Admitting: Physical Medicine and Rehabilitation

## 2011-12-04 VITALS — BP 114/82 | HR 64 | Resp 16 | Ht 64.0 in | Wt 187.0 lb

## 2011-12-04 DIAGNOSIS — M25512 Pain in left shoulder: Secondary | ICD-10-CM

## 2011-12-04 DIAGNOSIS — M79606 Pain in leg, unspecified: Secondary | ICD-10-CM

## 2011-12-04 DIAGNOSIS — M79609 Pain in unspecified limb: Secondary | ICD-10-CM | POA: Insufficient documentation

## 2011-12-04 DIAGNOSIS — R279 Unspecified lack of coordination: Secondary | ICD-10-CM | POA: Insufficient documentation

## 2011-12-04 DIAGNOSIS — M24573 Contracture, unspecified ankle: Secondary | ICD-10-CM | POA: Insufficient documentation

## 2011-12-04 DIAGNOSIS — M549 Dorsalgia, unspecified: Secondary | ICD-10-CM

## 2011-12-04 DIAGNOSIS — Z87898 Personal history of other specified conditions: Secondary | ICD-10-CM

## 2011-12-04 DIAGNOSIS — M542 Cervicalgia: Secondary | ICD-10-CM

## 2011-12-04 DIAGNOSIS — M25519 Pain in unspecified shoulder: Secondary | ICD-10-CM | POA: Insufficient documentation

## 2011-12-04 DIAGNOSIS — M24571 Contracture, right ankle: Secondary | ICD-10-CM

## 2011-12-04 DIAGNOSIS — IMO0001 Reserved for inherently not codable concepts without codable children: Secondary | ICD-10-CM | POA: Insufficient documentation

## 2011-12-04 DIAGNOSIS — M76899 Other specified enthesopathies of unspecified lower limb, excluding foot: Secondary | ICD-10-CM | POA: Insufficient documentation

## 2011-12-04 DIAGNOSIS — M545 Low back pain, unspecified: Secondary | ICD-10-CM | POA: Insufficient documentation

## 2011-12-04 HISTORY — DX: Personal history of other specified conditions: Z87.898

## 2011-12-04 HISTORY — DX: Dorsalgia, unspecified: M54.9

## 2011-12-04 HISTORY — DX: Pain in leg, unspecified: M79.606

## 2011-12-04 HISTORY — DX: Cervicalgia: M54.2

## 2011-12-04 NOTE — Progress Notes (Signed)
Addended by: Judd Gaudier on: 12/04/2011 12:14 PM   Modules accepted: Orders
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
## 2011-12-09 ENCOUNTER — Ambulatory Visit: Payer: Commercial Indemnity | Admitting: Psychology

## 2011-12-16 ENCOUNTER — Ambulatory Visit (INDEPENDENT_AMBULATORY_CARE_PROVIDER_SITE_OTHER): Payer: PRIVATE HEALTH INSURANCE | Admitting: Psychology

## 2011-12-16 ENCOUNTER — Ambulatory Visit
Admission: RE | Admit: 2011-12-16 | Discharge: 2011-12-16 | Disposition: A | Discharge status: Home / Self Care | Payer: Managed Care, Other (non HMO) | Source: Ambulatory Visit | Attending: Physical Medicine and Rehabilitation | Admitting: Physical Medicine and Rehabilitation

## 2011-12-16 DIAGNOSIS — F331 Major depressive disorder, recurrent, moderate: Secondary | ICD-10-CM

## 2011-12-16 DIAGNOSIS — Z87898 Personal history of other specified conditions: Secondary | ICD-10-CM

## 2011-12-16 DIAGNOSIS — M25512 Pain in left shoulder: Secondary | ICD-10-CM

## 2011-12-16 DIAGNOSIS — M542 Cervicalgia: Secondary | ICD-10-CM

## 2011-12-21 ENCOUNTER — Encounter: Payer: Managed Care, Other (non HMO) | Admitting: Physical Medicine and Rehabilitation

## 2011-12-23 ENCOUNTER — Encounter
Payer: Managed Care, Other (non HMO) | Attending: Physical Medicine and Rehabilitation | Admitting: Physical Medicine and Rehabilitation

## 2011-12-23 ENCOUNTER — Encounter: Payer: Self-pay | Admitting: Physical Medicine and Rehabilitation

## 2011-12-23 VITALS — BP 134/88 | HR 80 | Resp 14 | Ht 64.0 in | Wt 195.0 lb

## 2011-12-23 DIAGNOSIS — M79609 Pain in unspecified limb: Secondary | ICD-10-CM

## 2011-12-23 DIAGNOSIS — M79606 Pain in leg, unspecified: Secondary | ICD-10-CM

## 2011-12-23 DIAGNOSIS — M76899 Other specified enthesopathies of unspecified lower limb, excluding foot: Secondary | ICD-10-CM

## 2011-12-23 DIAGNOSIS — M549 Dorsalgia, unspecified: Secondary | ICD-10-CM

## 2011-12-23 DIAGNOSIS — R29818 Other symptoms and signs involving the nervous system: Secondary | ICD-10-CM

## 2011-12-23 DIAGNOSIS — M7062 Trochanteric bursitis, left hip: Secondary | ICD-10-CM

## 2011-12-23 DIAGNOSIS — R2689 Other abnormalities of gait and mobility: Secondary | ICD-10-CM

## 2011-12-23 DIAGNOSIS — M542 Cervicalgia: Secondary | ICD-10-CM

## 2011-12-23 DIAGNOSIS — M7061 Trochanteric bursitis, right hip: Secondary | ICD-10-CM

## 2011-12-23 DIAGNOSIS — R292 Abnormal reflex: Secondary | ICD-10-CM
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
## 2011-12-23 NOTE — Patient Instructions (Signed)
Will see you back in 2-3 weeks for hip injections with ultrasound  I have ordered an MRI of your neck  We will hold off on ending anymore medications.

## 2011-12-28 ENCOUNTER — Ambulatory Visit: Payer: Commercial Indemnity | Admitting: Family Medicine

## 2011-12-29 ENCOUNTER — Telehealth: Payer: Self-pay | Admitting: Physical Medicine and Rehabilitation

## 2011-12-29 NOTE — Telephone Encounter (Signed)
[employer] will not cover MRI due to "failure to improve after a recent 6 week trial of MD guided clinical observation or any signs of symptoms

## 2011-12-30 ENCOUNTER — Other Ambulatory Visit: Payer: Self-pay | Admitting: *Deleted

## 2011-12-30 ENCOUNTER — Telehealth: Payer: Self-pay

## 2011-12-30 MED ORDER — ALPRAZOLAM 0.5 MG PO TABS: 0.5000 mg | ORAL_TABLET | Freq: Three times a day (TID) | ORAL | Status: DC | PRN

## 2011-12-30 NOTE — Telephone Encounter (Signed)
Received faxed refill request from pharmacy. Last office visit 11/30/11. Is it okay to refill medication?

## 2011-12-30 NOTE — Telephone Encounter (Signed)
Px written for call in   

## 2011-12-30 NOTE — Telephone Encounter (Signed)
Please let patient know reason why insurance will not cover this.  She has option to pay out of pocket otherwise.

## 2011-12-30 NOTE — Telephone Encounter (Signed)
Rx Done . 

## 2011-12-30 NOTE — Telephone Encounter (Signed)
Pt wanted to make sure the record release pt signed to release records to the disability insurance co was sufficient. Records were sent to [facility] on 12/29/11. Pt given info while on phone.

## 2012-01-05 ENCOUNTER — Ambulatory Visit: Payer: PRIVATE HEALTH INSURANCE | Admitting: Psychology

## 2012-01-15 ENCOUNTER — Encounter: Payer: Self-pay | Admitting: Family Medicine

## 2012-01-15 ENCOUNTER — Encounter: Payer: Self-pay | Admitting: Physical Medicine and Rehabilitation

## 2012-01-15 ENCOUNTER — Ambulatory Visit (INDEPENDENT_AMBULATORY_CARE_PROVIDER_SITE_OTHER): Payer: PRIVATE HEALTH INSURANCE | Admitting: Family Medicine

## 2012-01-15 ENCOUNTER — Ambulatory Visit: Payer: PRIVATE HEALTH INSURANCE | Admitting: Physical Medicine and Rehabilitation

## 2012-01-15 ENCOUNTER — Encounter
Payer: Managed Care, Other (non HMO) | Attending: Physical Medicine and Rehabilitation | Admitting: Physical Medicine and Rehabilitation

## 2012-01-15 VITALS — BP 116/87 | HR 75 | Temp 98.0°F | Ht 64.0 in | Wt 195.8 lb

## 2012-01-15 VITALS — BP 124/74 | HR 77 | Resp 16 | Ht 64.0 in | Wt 194.0 lb

## 2012-01-15 DIAGNOSIS — M2550 Pain in unspecified joint: Secondary | ICD-10-CM

## 2012-01-15 DIAGNOSIS — M542 Cervicalgia: Secondary | ICD-10-CM | POA: Insufficient documentation

## 2012-01-15 DIAGNOSIS — M7062 Trochanteric bursitis, left hip: Secondary | ICD-10-CM

## 2012-01-15 DIAGNOSIS — F329 Major depressive disorder, single episode, unspecified: Secondary | ICD-10-CM

## 2012-01-15 DIAGNOSIS — F411 Generalized anxiety disorder: Secondary | ICD-10-CM

## 2012-01-15 DIAGNOSIS — R292 Abnormal reflex: Secondary | ICD-10-CM | POA: Insufficient documentation

## 2012-01-15 DIAGNOSIS — R29818 Other symptoms and signs involving the nervous system: Secondary | ICD-10-CM | POA: Insufficient documentation

## 2012-01-15 MED ORDER — BUPROPION HCL ER (XL) 300 MG PO TB24: 300.0000 mg | ORAL_TABLET | Freq: Every day | ORAL | Status: DC

## 2012-01-15 NOTE — Assessment & Plan Note (Signed)
Is needing a bit less xanax lately- will continue to monitor Exercise is helping  Ins did not cover the xr

## 2012-01-15 NOTE — Progress Notes (Signed)
Subjective:    Patient ID: Morgan [name], female    DOB: [birth year], 51 y.o.   MRN: [id number]  HPI Here for f/u of her depression and anxiety   Last visit added wellbutrin xl 150 mg daily Is less tearful than she was  Took a little while to work  A lot of stress- going to a lot of doctors  Just trying to get back to normal  Still on cymbalta  Taking xanax tid (ins did not cover the xr) -- sometimes she takes it less  Is interested in going up on the wellbutrin    Still wrestling with aches and pains Dr Maretta Los Had hip injections this am - helping  Using a cane  MRI of neck - insurance deney- for a while   Dr Jon Billings thinks she may have lupus (mild) -- but that dapsone may be suff therapy Also fibromyalgia Water aerobics three times per week - now does 45 minutes (the whole class) Also will be going to neurologist - has newly dx "drop foot"  Also some brisk reflexes and balance issues    Patient Active Problem List  Diagnosis  . HSV  . HYPERTHYROIDISM  . HYPOTHYROIDISM  . VITAMIN B12 DEFICIENCY  . HYPERLIPIDEMIA  . ANXIETY  . DEPRESSION  . HEMORRHOIDS  . ASTHMA  . GERD  . FIBROMYALGIA  . WEAKNESS  . VARICOSE VEINS LOWER EXTREMITIES W/OTH COMPS  . SKIN LESION  . Tinea corporis  . Hypertension, essential, benign  . Fatigue  . Joint pain  . Dermatitis herpetiformis  . Back pain  . Leg pain  . Neck pain  . Left shoulder pain  . History of balance disorder   Past Medical History  Diagnosis Date  . Asthma   . Depression   . GERD (gastroesophageal reflux disease)   . HLD (hyperlipidemia)   . Hypothyroidism   . Anxiety   . Celiac disease   . Recurrent HSV (herpes simplex virus)   . Fibula fracture   . Unspecified hemorrhoids without mention of complication   . Varicose veins of lower extremities with other complications   . Other B-complex deficiencies   . Hypertension   . Myalgia and myositis, unspecified   . Allergy     seasonal  . Arthritis     Past Surgical History  Procedure Date  . Total vaginal hysterectomy 10/98  . Other surgical history     laser surgery for endometriosis  . Nasal sinus surgery   . Mandible surgery   . Esophagogastroduodenoscopy 1/04    normal  . Colonoscopy 11/00    biopsy negative  . Fibula fracture surgery    History  Substance Use Topics  . Smoking status: Never Smoker   . Smokeless tobacco: Never Used  . Alcohol Use: No     Very rare   Family History  Problem Relation Age of Onset  . Colon polyps Father   . Hypertension Father   . Asthma Mother   . Hypertension Mother   . Irritable bowel syndrome Mother   . Colon cancer Maternal Grandmother   . Uterine cancer    . Kidney disease     Allergies  Allergen Reactions  . Iodine     REACTION: SOB with injectable iodine  . Iohexol   . Klonopin (Clonazepam)     Over sedated    Current Outpatient Prescriptions on File Prior to Visit  Medication Sig Dispense Refill  . albuterol (PROVENTIL HFA;VENTOLIN HFA) 108 (90 BASE) MCG/ACT  inhaler Inhale 2 puffs into the lungs every 6 (six) hours as needed.        . ALPRAZolam (XANAX) 0.5 MG tablet Take 1 tablet (0.5 mg total) by mouth 3 (three) times daily as needed.  90 tablet  0  . baclofen (LIORESAL) 10 MG tablet Take 1 tablet by mouth Twice daily as needed.      Marland Kitchen buPROPion (WELLBUTRIN XL) 150 MG 24 hr tablet Take 1 tablet (150 mg total) by mouth daily.  30 tablet  11  . dapsone 25 MG tablet Take 25 mg by mouth as needed.       . DULoxetine (CYMBALTA) 60 MG capsule Take 1 capsule (60 mg total) by mouth daily.  60 capsule  6  . fluticasone (FLONASE) 50 MCG/ACT nasal spray Place 2 sprays into the nose daily.  1 g  6  . Fluticasone-Salmeterol (ADVAIR) 100-50 MCG/DOSE AEPB Inhale 1 puff into the lungs every 12 (twelve) hours.        Marland Kitchen HYDROcodone-acetaminophen (NORCO) 7.5-325 MG per tablet Take 0.5 tablets by mouth Every 4 hours as needed.      . hyoscyamine (LEVSIN SL) 0.125 MG SL tablet Place 1  tablet (0.125 mg total) under the tongue every 4 (four) hours as needed.  30 tablet  6  . levothyroxine (SYNTHROID, LEVOTHROID) 125 MCG tablet Take one tablet by mouth on Saturday and Sunday.      . levothyroxine (SYNTHROID, LEVOTHROID) 137 MCG tablet Take 1 tablet by mouth daily Monday thru Friday.      . loratadine (CLARITIN) 10 MG tablet Take 10 mg by mouth daily.        . meloxicam (MOBIC) 7.5 MG tablet Take 7.5 mg by mouth 2 (two) times daily as needed.       . metoprolol succinate (TOPROL-XL) 50 MG 24 hr tablet Take 1 tablet (50 mg total) by mouth daily. Take with or immediately following a meal.  30 tablet  11  . valACYclovir (VALTREX) 500 MG tablet Take 1 tablet (500 mg total) by mouth daily as needed.  30 tablet  3  . Vitamin D, Ergocalciferol, (DRISDOL) 50000 UNITS CAPS Take 1 capsule by mouth Once a week.          Review of Systems Review of Systems  Constitutional: Negative for fever, appetite change, fatigue and unexpected weight change.  Eyes: Negative for pain and visual disturbance.  Respiratory: Negative for cough and shortness of breath.   Cardiovascular: Negative for cp or palpitations    Gastrointestinal: Negative for nausea, diarrhea and constipation.  Genitourinary: Negative for urgency and frequency.  Skin: Negative for pallor or rash   MSK pos for diffuse widespread pain / joint and muscle - neg for joint swelling  Neurological: Negative for weakness, light-headedness, numbness and headaches.  Hematological: Negative for adenopathy. Does not bruise/bleed easily.  Psychiatric/Behavioral: pos for anx that is slt improved, pos for depression that is quite improved , neg for SI        Objective:   Physical Exam  Constitutional: She appears well-developed and well-nourished. No distress.       Obese and well app Walking with a cane  HENT:  Head: Normocephalic and atraumatic.  Eyes: Conjunctivae and EOM are normal. Pupils are equal, round, and reactive to light.    Neck: Normal range of motion. Neck supple. No thyromegaly present.  Cardiovascular: Normal rate and regular rhythm.   Pulmonary/Chest: Effort normal and breath sounds normal.  Neurological: She is alert.  She has normal reflexes. She displays no tremor. She exhibits normal muscle tone. Coordination normal.  Skin: Skin is warm and dry. No erythema. No pallor.  Psychiatric: Her speech is normal and behavior is normal. Judgment normal. Her mood appears anxious. Her affect is not blunt and not labile. She is not agitated and not withdrawn. Cognition and memory are normal. She exhibits a depressed mood. She expresses no homicidal and no suicidal ideation.       Overall still seemingly sad and fatigued but not at all tearful Communicates well and good eye contact  Improvement noted           Assessment & Plan:

## 2012-01-15 NOTE — Assessment & Plan Note (Addendum)
Notable improvement so far with wellbutrin- will inc to 300 at pt's request Also continues cymbalta Stressor of chronic pain is unchanged No side eff from new medication , no appetite change  Disc poss problems - will update  F/u 2-3 mo  Coping skills are good - motivated to push through her physical issues right now

## 2012-01-15 NOTE — Patient Instructions (Addendum)
F/u in 6 weeks    Joint Injection Care After Refer to this sheet in the next few days. These instructions provide you with information on caring for yourself after you have had a joint injection. Your caregiver also may give you more specific instructions. Your treatment has been planned according to current medical practices, but problems sometimes occur. Call your caregiver if you have any problems or questions after your procedure. After any type of joint injection, it is not uncommon to experience:  Soreness, swelling, or bruising around the injection site.   Mild numbness, tingling, or weakness around the injection site caused by the numbing medicine used before or with the injection.  It also is possible to experience the following effects associated with the specific agent after injection:  Iodine-based contrast agents:   Allergic reaction (itching, hives, widespread redness, and swelling beyond the injection site).   Corticosteroids (These effects are rare.):   Allergic reaction.   Increased blood sugar levels (If you have diabetes and you notice that your blood sugar levels have increased, notify your caregiver).   Increased blood pressure levels.   Mood swings.   Hyaluronic acid in the use of viscosupplementation.   Temporary heat or redness.   Temporary rash and itching.   Increased fluid accumulation in the injected joint.  These effects all should resolve within a day after your procedure.  HOME CARE INSTRUCTIONS  Limit yourself to light activity the day of your procedure. Avoid lifting heavy objects, bending, stooping, or twisting.   Take prescription or over-the-counter pain medication as directed by your caregiver.   You may apply ice to your injection site to reduce pain and swelling the day of your procedure. Ice may be applied 3 to 4 times:   Put ice in a plastic bag.   Place a towel between your skin and the bag.   Leave the ice on for no longer  than 15 to 20 minutes each time.  SEEK IMMEDIATE MEDICAL CARE IF:   Pain and swelling get worse rather than better or extend beyond the injection site.   Numbness does not go away.   Blood or fluid continues to leak from the injection site.   You have chest pain.   You have swelling of your face or tongue.   You have trouble breathing or you become dizzy.   You develop a fever, chills, or severe tenderness at the injection site that last longer than 1 day.  MAKE SURE YOU:  Understand these instructions.   Watch your condition.   Get help right away if you are not doing well or if you get worse.  Document Released: 03/12/2011 Document Revised: 06/18/2011 Document Reviewed: 03/12/2011 [facility] Patient Information 2012 [place], [place].

## 2012-01-15 NOTE — Progress Notes (Signed)
51 yo married woman who presents with a history of celiac sprue which was diagnosed in 2004. She also has a history of a positive ANA, fibromyalgia syndrome, and dermatitis herpetaformis, thyroid (hypo- Hashimotos thyroiditis) . She recently had a recent duodenal biopsy per Dr. Marina Goodell. Per her report was read as normal.  She has been using Dapsone for her dermatitis herpetaformis off and on since 2004. She reports however that since January she's been using it regularly for lesions that has been.      Pt has multiple pain complaints which include bilat hip and lateral leg pain history and physical exam are consistant with bilateral trochanter bursitis. Bilateral trocanteric burstitis. Right worse than left  Conservative measures have not decreased her hip pain. She has had improvement in past with injection.  We discussed various treatment options for her hips. She has had phyical therapy for the hips which has not helped much.  Water therapy is on going. At this time she would like to pursue reinjection of the right trochanter to help relieve pain.    Right Hip Injection With U/S guidance    Static and realtime images were reviewed and stored. The area was marked using u/s for guidance.    Indication:Right and Left Hip pain not relieved by medication management and other conservative care.  Informed consent was obtained after describing risks and benefits of the procedure with the patient, this includes bleeding, bruising, infection and medication side effects. The patient wishes to proceed and has given written consent. The patient was placed in a lateral decubitus position. The lateral aspect of the greater trochanter was marked and prepped with Betadine and alcohol A 25 gauge 3 inch needle was inserted into the lateral hip (greater trochanter area). After negative draw back for blood, a solution containing one ML of 40 mg per mL kenalog and 4 mL of 1% lidocaine were injected. The patient  tolerated the procedure well. Post procedure instructions were given

## 2012-01-15 NOTE — Patient Instructions (Addendum)
Increase your wellbutrin xl from 150 to 300 mg once daily  Keep up the good work with exercise and physical therapy  Follow up with me in 2-3 months please If any side effects or if you feel worse , please update me

## 2012-01-27 ENCOUNTER — Ambulatory Visit (INDEPENDENT_AMBULATORY_CARE_PROVIDER_SITE_OTHER): Payer: PRIVATE HEALTH INSURANCE | Admitting: Psychology

## 2012-01-27 DIAGNOSIS — F331 Major depressive disorder, recurrent, moderate: Secondary | ICD-10-CM

## 2012-02-01 ENCOUNTER — Other Ambulatory Visit: Payer: Self-pay | Admitting: *Deleted

## 2012-02-01 MED ORDER — ALPRAZOLAM 0.5 MG PO TABS: 0.5000 mg | ORAL_TABLET | Freq: Three times a day (TID) | ORAL | Status: DC | PRN

## 2012-02-01 NOTE — Telephone Encounter (Signed)
Px written for call in   

## 2012-02-01 NOTE — Telephone Encounter (Signed)
Received faxed refill request from pharmacy. Last office visit 01/15/12. Is it okay to refill medication?

## 2012-02-02 NOTE — Telephone Encounter (Signed)
Called Rx as instructed.
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
## 2012-02-10 ENCOUNTER — Encounter (HOSPITAL_BASED_OUTPATIENT_CLINIC_OR_DEPARTMENT_OTHER): Payer: Managed Care, Other (non HMO) | Admitting: Physical Medicine and Rehabilitation

## 2012-02-10 ENCOUNTER — Encounter: Payer: Self-pay | Admitting: Physical Medicine and Rehabilitation

## 2012-02-10 ENCOUNTER — Telehealth: Payer: Self-pay | Admitting: Physical Medicine and Rehabilitation

## 2012-02-10 VITALS — BP 129/73 | HR 77 | Resp 16 | Ht 64.0 in | Wt 187.0 lb

## 2012-02-10 DIAGNOSIS — M7062 Trochanteric bursitis, left hip: Secondary | ICD-10-CM

## 2012-02-10 DIAGNOSIS — M549 Dorsalgia, unspecified: Secondary | ICD-10-CM

## 2012-02-10 DIAGNOSIS — R269 Unspecified abnormalities of gait and mobility: Secondary | ICD-10-CM

## 2012-02-10 DIAGNOSIS — IMO0001 Reserved for inherently not codable concepts without codable children: Secondary | ICD-10-CM

## 2012-02-10 DIAGNOSIS — M7061 Trochanteric bursitis, right hip: Secondary | ICD-10-CM

## 2012-02-10 DIAGNOSIS — M76899 Other specified enthesopathies of unspecified lower limb, excluding foot: Secondary | ICD-10-CM

## 2012-02-10 MED ORDER — PREGABALIN 50 MG PO CAPS: 50.0000 mg | ORAL_CAPSULE | Freq: Three times a day (TID) | ORAL | Status: DC

## 2012-02-10 NOTE — Patient Instructions (Signed)
I am sending you to a physical therapist to address your bilateral hip pain which I feel is partly related to trochanter bursitis/iliotibial band syndrome.  You also have some mild arthritis in your low back.  I would like to therapist to work on lower extremity flexibility, consider using some ultrasound educating you how to use ice and other modalities correctly. Would like them to work on range of motion/strengthening.  I would also like them to assess your gait and address functional deficits at your right ankle. They may consider trialing U. with a brace. You may new better with just a cane however I will have them addressed this in the department with a can observe you with different options for treatment.  I have also ordered some Lyrica. I'm starting you on a small dose in the evening. I may increase this over the next several weeks. Left me know if you have any trouble with this medicine, to have problems with that you may just discontinue it then, I office and let me know that you stop it.
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
## 2012-02-10 NOTE — Telephone Encounter (Signed)
Lyrica too expensive.  Can Dr change to something else?

## 2012-02-11 NOTE — Telephone Encounter (Signed)
She could take Gabapentin, ask her whether she had tried/tolerated this in the past.  300mg  tid, start with 1tablet at bedtime for 2 days , add 2nd tablet at am on the 3rd day, then 2 days later add the 3rd tablet

## 2012-02-11 NOTE — Telephone Encounter (Signed)
Please advise for Dr. Pamelia Hoit. Thanks!

## 2012-02-12 MED ORDER — GABAPENTIN 300 MG PO CAPS: 300.0000 mg | ORAL_CAPSULE | Freq: Three times a day (TID) | ORAL | Status: DC

## 2012-02-12 NOTE — Telephone Encounter (Signed)
Pt aware and rx has been sent to her pharmacy.

## 2012-02-12 NOTE — Telephone Encounter (Signed)
LM for pt to call back.

## 2012-02-26 ENCOUNTER — Ambulatory Visit: Payer: PRIVATE HEALTH INSURANCE | Admitting: Physical Medicine and Rehabilitation

## 2012-03-07 ENCOUNTER — Encounter
Payer: Managed Care, Other (non HMO) | Attending: Physical Medicine and Rehabilitation | Admitting: Physical Medicine and Rehabilitation

## 2012-03-07 ENCOUNTER — Encounter: Payer: Self-pay | Admitting: Physical Medicine and Rehabilitation

## 2012-03-07 VITALS — BP 99/63 | HR 80 | Resp 14 | Ht 64.0 in | Wt 188.4 lb

## 2012-03-07 DIAGNOSIS — M542 Cervicalgia: Secondary | ICD-10-CM | POA: Insufficient documentation

## 2012-03-07 DIAGNOSIS — M79609 Pain in unspecified limb: Secondary | ICD-10-CM

## 2012-03-07 DIAGNOSIS — R29818 Other symptoms and signs involving the nervous system: Secondary | ICD-10-CM | POA: Insufficient documentation

## 2012-03-07 DIAGNOSIS — M79606 Pain in leg, unspecified: Secondary | ICD-10-CM

## 2012-03-07 DIAGNOSIS — Z87898 Personal history of other specified conditions: Secondary | ICD-10-CM

## 2012-03-07 DIAGNOSIS — IMO0001 Reserved for inherently not codable concepts without codable children: Secondary | ICD-10-CM

## 2012-03-07 DIAGNOSIS — R292 Abnormal reflex: Secondary | ICD-10-CM | POA: Insufficient documentation

## 2012-03-07 DIAGNOSIS — M549 Dorsalgia, unspecified: Secondary | ICD-10-CM

## 2012-03-07 MED ORDER — GABAPENTIN 100 MG PO CAPS: 100.0000 mg | ORAL_CAPSULE | Freq: Three times a day (TID) | ORAL | Status: DC
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
## 2012-03-07 NOTE — Patient Instructions (Signed)
I have switched your gabapentin to 100 mg twice a day and 300 mg at bedtime Will see you next month to evaluate this new dosing schedule with you.   Continue physical therapy to work on strengthening your right ankle.  I would like to therapist to work on your balance as well.  I'm glad your right hip is doing better in that your gait is improving.

## 2012-04-04 ENCOUNTER — Ambulatory Visit: Payer: PRIVATE HEALTH INSURANCE | Admitting: Physical Medicine and Rehabilitation

## 2012-04-06 ENCOUNTER — Encounter: Payer: PRIVATE HEALTH INSURANCE | Admitting: Physical Medicine and Rehabilitation

## 2012-04-18 ENCOUNTER — Encounter: Payer: Self-pay | Admitting: Family Medicine

## 2012-04-18 ENCOUNTER — Ambulatory Visit (INDEPENDENT_AMBULATORY_CARE_PROVIDER_SITE_OTHER): Payer: PRIVATE HEALTH INSURANCE | Admitting: Family Medicine

## 2012-04-18 VITALS — BP 126/84 | HR 64 | Temp 98.4°F | Ht 64.0 in | Wt 187.0 lb

## 2012-04-18 DIAGNOSIS — F329 Major depressive disorder, single episode, unspecified: Secondary | ICD-10-CM

## 2012-04-18 DIAGNOSIS — Z23 Encounter for immunization: Secondary | ICD-10-CM

## 2012-04-18 DIAGNOSIS — I10 Essential (primary) hypertension: Secondary | ICD-10-CM

## 2012-04-18 DIAGNOSIS — F411 Generalized anxiety disorder: Secondary | ICD-10-CM

## 2012-04-18 DIAGNOSIS — IMO0001 Reserved for inherently not codable concepts without codable children: Secondary | ICD-10-CM

## 2012-04-18 MED ORDER — BACLOFEN 20 MG PO TABS: 20.0000 mg | ORAL_TABLET | Freq: Three times a day (TID) | ORAL | Status: DC | PRN
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
## 2012-04-18 NOTE — Patient Instructions (Addendum)
I sent baclofen to your pharmacy  Flu shot today  We will refer you to psychiatry at check out  Follow up with me in 3-6 months depending on how you are feeling

## 2012-04-22 ENCOUNTER — Telehealth: Payer: Self-pay | Admitting: Family Medicine

## 2012-04-22 MED ORDER — HYDROCODONE-ACETAMINOPHEN 5-500 MG PO TABS: 1.0000 | ORAL_TABLET | Freq: Four times a day (QID) | ORAL | Status: DC | PRN

## 2012-04-22 NOTE — Telephone Encounter (Signed)
Follow up if bp stays high  Increase valtrex to bid for 7 days- call that in if needed  Can call in some vicodin with caution for pain and f/u if needed

## 2012-04-22 NOTE — Telephone Encounter (Signed)
Advise pt to f/u if bp stays high or if needed for pain, increase valtrex to BID and pt said she had enough to take BID, and called in Vicodin as prescribed

## 2012-04-22 NOTE — Telephone Encounter (Signed)
Caller: [name]/Patient; Patient Name: Morgan [name]; PCP: Roxy Manns [facility]); Best Callback Phone Number: [phone]. LMP- Hysterectomy.  Patient is calling for Elevated blood pressure.  She was seen at her psychiatrist office today it  was 149/98.  Recheck before leaving office was 140/94.    Her psychiatrist wanted to let Dr. Milinda Antis know about her blood pressure.  She states her pressure does go up and down. She was at a new physican office. She may have not  Taken medication  last night but cannot remember. She is under stress. Emergent s/sx ruled out per Hypertension Protocol with the exception of  "single elevated blood pressure reading". Home care instructions reviewed. Advised to keep blood pressure log . Encouraged to call back for quesitons, changes or concerns and elevated reading. Encouraged compliance with medications.   (2) Shingles  Outbreak on her back  with pain down her leg. She is currently taking the Valtrex but has a crop of blisters on her lower back. She is taking Advil and her Valtrex is there anything else she can do?   Valtrex 500mg  one daily and has been taking since 04/16/12.    Emergent s/sx ruled out per Skin Lesions wiht exception to "Painful Lesions unrelieved with home care measures". See Provider 24 hours.  Reviewed home care instructions. PATIENT WAS JUST IN OFFICE ON MONDAY. DR. Milinda Antis ASSESED. WHAT CAN PATIENT DO OTHER THAN VALTREX FOR SHINGLES PAIN?

## 2012-04-25 ENCOUNTER — Other Ambulatory Visit: Payer: Self-pay | Admitting: *Deleted

## 2012-04-25 MED ORDER — METOPROLOL SUCCINATE ER 50 MG PO TB24: 50.0000 mg | ORAL_TABLET | Freq: Every day | ORAL | Status: DC

## 2012-04-25 NOTE — Telephone Encounter (Signed)
Advise pt to keep monitoring BP and stay on the toprol xl, called in 1 yr of refills

## 2012-04-25 NOTE — Telephone Encounter (Signed)
I am happy with bp today- keep monitoring it and stay on the toprol xl  Please call more in if she needs it (can refil for the year)

## 2012-04-25 NOTE — Telephone Encounter (Signed)
Pt called with BP readings: 04/23/12 AM 133/88> pt was out of Toprol XL. On 04/24/12 AM BP 144/102 got one Toprol XL from [facility] at 4 pm; BP at 6:15 pm  133/93.  Today BP 5:15 am 123/80 and 12 noon 132/88.Pt is not having any h/a dizziness or chest pain. [employer].Please advise.

## 2012-05-26 ENCOUNTER — Ambulatory Visit
Admission: RE | Admit: 2012-05-26 | Discharge: 2012-05-26 | Disposition: A | Discharge status: Home / Self Care | Payer: Managed Care, Other (non HMO) | Source: Ambulatory Visit | Attending: Family Medicine | Admitting: Family Medicine

## 2012-05-26 ENCOUNTER — Encounter: Payer: Self-pay | Admitting: Family Medicine

## 2012-05-26 ENCOUNTER — Ambulatory Visit (INDEPENDENT_AMBULATORY_CARE_PROVIDER_SITE_OTHER)
Admission: RE | Admit: 2012-05-26 | Discharge: 2012-05-26 | Disposition: A | Discharge status: Home / Self Care | Payer: Managed Care, Other (non HMO) | Source: Ambulatory Visit | Attending: Family Medicine | Admitting: Family Medicine

## 2012-05-26 ENCOUNTER — Ambulatory Visit (INDEPENDENT_AMBULATORY_CARE_PROVIDER_SITE_OTHER): Payer: PRIVATE HEALTH INSURANCE | Admitting: Family Medicine

## 2012-05-26 VITALS — BP 110/82 | HR 68 | Temp 99.0°F | Wt 192.5 lb

## 2012-05-26 DIAGNOSIS — M461 Sacroiliitis, not elsewhere classified: Secondary | ICD-10-CM

## 2012-05-26 DIAGNOSIS — B009 Herpesviral infection, unspecified: Secondary | ICD-10-CM

## 2012-05-26 HISTORY — DX: Sacroiliitis, not elsewhere classified: M46.1

## 2012-05-26 NOTE — Progress Notes (Signed)
  Subjective:    Patient ID: Morgan [name], female    DOB: 05/15/1961, 51 y.o.   MRN: [id number]  HPI CC: back pain  Ongoing back pain for last 10 months.  Now new pain in R lower back for last few months - starts above tailbone and radiates to buttock.  Not lower than buttock.  Constant ache, alternating with sharp severe catching pain.    Denies fevers/chills, dysuria, urgency or frequency, abd pain, nausea/vomiting.  No numbness or weakness of right leg.  No bowel/bladder incontinence.  Did have 2 bowel accidents during sleep.  Drop foot not any worse than prior.  Takes baclofen for fibromyalgia.  On cymbalta, abilify.  H/o drop foot on right side for 10 months.  Has had full eval by neurologist.  Has seen neurologist [facility]), rheumatologist Corliss Skains), orthopedist (Dr. Prince Rome).  Saw Dr. Prince Rome early 05/2012, who recommended she try parafon forte - didn't help.  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  Latest pelvic xray was early 2013, negative for AS.  Has been to pain management (Dr. Merrie Roof), but unable to afford continue going.  Recently PCP sent to psychiatrist.  Some SI.   Has had MRI of entire spine and brain, last lumbar spine MRI was ~6 mo ago.  Does home ankle exercises, does water aerobics  Past Medical History  Diagnosis Date  . Asthma   . Depression   . GERD (gastroesophageal reflux disease)   . HLD (hyperlipidemia)   . Hypothyroidism   . Anxiety   . Celiac disease   . Recurrent HSV (herpes simplex virus)   . Fibula fracture   . Unspecified hemorrhoids without mention of complication   . Varicose veins of lower extremities with other complications   . Other B-complex deficiencies   . Hypertension   . Myalgia and myositis, unspecified   . Allergy     seasonal  . Arthritis      Review of Systems Per HPI    Objective:   Physical Exam  Nursing note and vitals reviewed. Constitutional: She appears well-developed and well-nourished.  HENT:    Head: Normocephalic and atraumatic.  Musculoskeletal:       No midline spine tenderness to palpation. + left paraspinous mm tightness noted. Very tender with palpation at R SIJ.  Not at L SIJ or at GTB or sciatic notch. + FABER on right. Neg SLR bilaterally.  Neurological: No sensory deficit. Coordination normal.  Reflex Scores:      Patellar reflexes are 2+ on the right side and 2+ on the left side.      Achilles reflexes are 2+ on the right side and 2+ on the left side.      4+/5 strength R knee extension, 5/5 rest of BLE strength. Able to toe walk.  Difficulty with heel walking on right (chronic).  Skin:          Shallow erythematous ulcer R superior buttock Small scabbed ulcer superior to this       Assessment & Plan:

## 2012-05-26 NOTE — Assessment & Plan Note (Addendum)
History/exam consistent with R sacroiliitis - discussed treatment to include NSAIDs, ice/heat, and stretching exercises provided from [facility] pt advisor. ?h/o ankylosing spondylitis - will check sacral xrays today to eval for this. Discussed if not improving as expected, rec f/u with PCP or rheum. A total of 25 minutes were spent face-to-face with the patient during this encounter and over half of that time was spent on counseling and coordination of care

## 2012-05-26 NOTE — Assessment & Plan Note (Signed)
H/o recurrent HSV outbreaks, has valtrex at home.  Recommended start this.

## 2012-05-26 NOTE — Patient Instructions (Addendum)
You have sacroiliitis - treat with anti inflammatory - take mobic 15mg  daily for 1 week then as needed.  Do ice to back.  Do stretching exercises provided today. Xray today. If not better return to see Dr. Milinda Antis, or Dr. Corliss Skains. Start valtrex for herpes lesion. I hope you start feeling better.

## 2012-06-01 ENCOUNTER — Encounter: Payer: Self-pay | Admitting: Family Medicine

## 2012-06-01 ENCOUNTER — Ambulatory Visit (INDEPENDENT_AMBULATORY_CARE_PROVIDER_SITE_OTHER): Payer: Managed Care, Other (non HMO) | Admitting: Family Medicine

## 2012-06-01 VITALS — BP 144/96 | HR 60 | Temp 98.4°F | Ht 64.0 in | Wt 193.5 lb

## 2012-06-01 DIAGNOSIS — M461 Sacroiliitis, not elsewhere classified: Secondary | ICD-10-CM

## 2012-06-01 DIAGNOSIS — M549 Dorsalgia, unspecified: Secondary | ICD-10-CM

## 2012-06-01 NOTE — Patient Instructions (Addendum)
We will do PT referral at check out  Stay as active as you can  Use heat on area 10 minutes on and off  Continue mobic if it is helping

## 2012-06-01 NOTE — Progress Notes (Signed)
Subjective:    Patient ID: Morgan [name], female    DOB: 01-Aug-1960, 50 y.o.   MRN: [id number]  HPI Pt here with continued low back pain   Has hx of autoimmune dz and also fibromyalgia/ pain syndrome  She sees both rheumatology and pain specialist   Saw Dr Reece Agar here earlier this month and sacroiliitis was r/o by xray, and nsaid/ heat/ice and stretches were discussed  Continues to have pain that is making her miserable  Pain seizes her at times - in very low back - worse on the R than the L  These spasms come on throughout the day and they freeze her in her activity -- moving definitely brings on the spasms  In between has a constant dull ace  Is not shooting down leg No numbness or weakness   She did see Dr Jorge Mandril for this before Dr Reece Agar-- and had no reason for it - did call her in parafon forte- did not work any better than the baclofen   Has never had low back pain like this  She has had lumbar MRI due to drop foot earlier in the year - did not find any cause  Minor OA in L4-L5 -nothing major   Using mobic 15 mg and also ice and heat   Sees rheum on dec 2nd   Patient Active Problem List  Diagnosis  . HSV  . HYPERTHYROIDISM  . HYPOTHYROIDISM  . VITAMIN B12 DEFICIENCY  . HYPERLIPIDEMIA  . ANXIETY  . DEPRESSION  . HEMORRHOIDS  . ASTHMA  . GERD  . FIBROMYALGIA  . WEAKNESS  . VARICOSE VEINS LOWER EXTREMITIES W/OTH COMPS  . SKIN LESION  . Tinea corporis  . Hypertension, essential, benign  . Fatigue  . Joint pain  . Dermatitis herpetiformis  . Back pain  . Leg pain  . Neck pain  . History of balance disorder  . Sacroiliitis   Past Medical History  Diagnosis Date  . Asthma   . Depression   . GERD (gastroesophageal reflux disease)   . HLD (hyperlipidemia)   . Hypothyroidism   . Anxiety   . Celiac disease   . Recurrent HSV (herpes simplex virus)   . Fibula fracture   . Unspecified hemorrhoids without mention of complication   . Varicose veins of lower extremities  with other complications   . Other B-complex deficiencies   . Hypertension   . Myalgia and myositis, unspecified   . Allergy     seasonal  . Arthritis    Past Surgical History  Procedure Date  . Total vaginal hysterectomy 10/98  . Other surgical history     laser surgery for endometriosis  . Nasal sinus surgery   . Mandible surgery   . Esophagogastroduodenoscopy 1/04    normal  . Colonoscopy 11/00    biopsy negative  . Fibula fracture surgery    History  Substance Use Topics  . Smoking status: Never Smoker   . Smokeless tobacco: Never Used  . Alcohol Use: Yes     Comment: Very rare   Family History  Problem Relation Age of Onset  . Colon polyps Father   . Hypertension Father   . Asthma Mother   . Hypertension Mother   . Irritable bowel syndrome Mother   . Colon cancer Maternal Grandmother   . Uterine cancer    . Kidney disease     Allergies  Allergen Reactions  . Iodine     REACTION: SOB with injectable iodine  .  Iohexol   . Klonopin (Clonazepam)     Over sedated    Current Outpatient Prescriptions on File Prior to Visit  Medication Sig Dispense Refill  . albuterol (PROVENTIL HFA;VENTOLIN HFA) 108 (90 BASE) MCG/ACT inhaler Inhale 2 puffs into the lungs every 6 (six) hours as needed.        . ALPRAZolam (XANAX) 0.5 MG tablet Take 1 tablet (0.5 mg total) by mouth 3 (three) times daily as needed.  90 tablet  3  . ARIPiprazole (ABILIFY) 5 MG tablet Take 2.5 mg by mouth daily.      . baclofen (LIORESAL) 20 MG tablet Take 1 tablet (20 mg total) by mouth 3 (three) times daily as needed.  90 each  3  . cloNIDine (CATAPRES) 0.1 MG tablet Take 0.1 mg by mouth 2 (two) times daily.      . dapsone 25 MG tablet Take 25 mg by mouth as needed.       . DULoxetine (CYMBALTA) 60 MG capsule Take 1 capsule (60 mg total) by mouth daily.  60 capsule  6  . fluticasone (FLONASE) 50 MCG/ACT nasal spray Place 2 sprays into the nose daily.  1 g  6  . Fluticasone-Salmeterol (ADVAIR)  100-50 MCG/DOSE AEPB Inhale 1 puff into the lungs every 12 (twelve) hours.        . hyoscyamine (LEVSIN SL) 0.125 MG SL tablet Place 1 tablet (0.125 mg total) under the tongue every 4 (four) hours as needed.  30 tablet  6  . levothyroxine (SYNTHROID, LEVOTHROID) 125 MCG tablet Take 125 mcg by mouth daily.       Marland Kitchen loratadine (CLARITIN) 10 MG tablet Take 10 mg by mouth daily.        . metoprolol succinate (TOPROL-XL) 50 MG 24 hr tablet Take 1 tablet (50 mg total) by mouth daily. Take with or immediately following a meal.  30 tablet  11  . valACYclovir (VALTREX) 500 MG tablet Take 1 tablet (500 mg total) by mouth daily as needed.  30 tablet  3     On baclofen- is on this now , this helps more and also has fibromyalgia  Was on gabapentin for a while - and no relief at all   Review of Systems Review of Systems  Constitutional: Negative for fever, appetite change, fatigue and unexpected weight change.  Eyes: Negative for pain and visual disturbance.  Respiratory: Negative for cough and shortness of breath.   Cardiovascular: Negative for cp or palpitations    Gastrointestinal: Negative for nausea, diarrhea and constipation.  Genitourinary: Negative for urgency and frequency.  Skin: Negative for pallor or rash  MSK pos for joint and muscle pain/ myofasical , neg for joint swelling   Neurological: Negative for weakness, light-headedness, numbness and headaches.  Hematological: Negative for adenopathy. Does not bruise/bleed easily.  Psychiatric/Behavioral: Negative for dysphoric mood. The patient is not nervous/anxious.         Objective:   Physical Exam  Constitutional: She appears well-developed and well-nourished. No distress.  HENT:  Head: Normocephalic and atraumatic.  Mouth/Throat: Oropharynx is clear and moist.  Eyes: Conjunctivae normal and EOM are normal. Pupils are equal, round, and reactive to light. No scleral icterus.  Neck: Normal range of motion. Neck supple. No thyromegaly  present.  Cardiovascular: Normal rate and regular rhythm.   Pulmonary/Chest: Effort normal and breath sounds normal.  Abdominal:       No suprapubic tenderness or fullness    Musculoskeletal: She exhibits tenderness. She exhibits no  edema.       Lumbar back: She exhibits decreased range of motion, tenderness, pain and spasm. She exhibits no bony tenderness, no swelling, no edema and no deformity.       R buttock/ piriformis area spasm and tenderness Pos SLR for some upper leg pain  Gait is labored Limited rom LS -due to moderate pain    Lymphadenopathy:    She has no cervical adenopathy.  Neurological: She is alert. She has normal strength and normal reflexes. She displays no atrophy. No sensory deficit. She exhibits normal muscle tone. Coordination normal.  Skin: Skin is warm and dry. No rash noted. No erythema. No pallor.  Psychiatric: She has a normal mood and affect.          Assessment & Plan:

## 2012-06-01 NOTE — Assessment & Plan Note (Signed)
Now radiating to R buttock primarily- sacroiliitis ruled out by x ray and having fairly nl LS MRI in the recent past  Suspect possible piriformis syndrome/ muscle pain Ref to PT Will continue mobic Also heat 10 minutes at a time

## 2012-08-01 ENCOUNTER — Encounter: Payer: Self-pay | Admitting: Family Medicine

## 2012-08-01 ENCOUNTER — Ambulatory Visit (INDEPENDENT_AMBULATORY_CARE_PROVIDER_SITE_OTHER): Payer: Managed Care, Other (non HMO) | Admitting: Family Medicine

## 2012-08-01 VITALS — BP 102/62 | HR 65 | Temp 98.4°F | Ht 64.0 in | Wt 201.5 lb

## 2012-08-01 DIAGNOSIS — F411 Generalized anxiety disorder: Secondary | ICD-10-CM

## 2012-08-01 DIAGNOSIS — R0789 Other chest pain: Secondary | ICD-10-CM

## 2012-08-01 DIAGNOSIS — R079 Chest pain, unspecified: Secondary | ICD-10-CM

## 2012-08-01 NOTE — Progress Notes (Signed)
Subjective:    Patient ID: Morgan [name], female    DOB: 1960/10/03, 52 y.o.   MRN: [id number]  HPI Here for anxiety and panic attacks Mood issues are getting worse   [facility] is no longer in her network - it will be expensive- was going to start going there  An appeal may happen as well  Has to decide what direction she is going to go   Her psychiatrist is working on her medicines  Pt is on cymbalta 90 mg (was on cymbalta 120 and dropped down) and xanax .5mg  , also abilify very low dose  Put on clonidine for her OCD Was in psychiatric facility at [facility] over thanksgiving -- in [place]- (after she was at [employer]) Suicidal ideation  She was comitted  - it was a traumatic experience - for 10 days - this really set her back and still not really recovered from that experience   Does not want to leave her house now - fear   Is on increased dapsone due to eating gluten in the hospital- temporarily      (was off abilify for a while)   Counseling   The anxiety is causing cp EKG today nl with sinus bradycardia rate 56 and no acute ST T changes  Not exertional - just anxiety linked  No nausea/ sweating or sob  No cp now  Happens when she is very anxious - and her psychiatrist wanted her to have and EKG   Patient Active Problem List  Diagnosis  . HSV  . HYPERTHYROIDISM  . HYPOTHYROIDISM  . VITAMIN B12 DEFICIENCY  . HYPERLIPIDEMIA  . ANXIETY  . DEPRESSION  . HEMORRHOIDS  . ASTHMA  . GERD  . FIBROMYALGIA  . WEAKNESS  . VARICOSE VEINS LOWER EXTREMITIES W/OTH COMPS  . SKIN LESION  . Tinea corporis  . Hypertension, essential, benign  . Fatigue  . Joint pain  . Dermatitis herpetiformis  . Back pain  . Leg pain  . Neck pain  . History of balance disorder  . Sacroiliitis   Past Medical History  Diagnosis Date  . Asthma   . Depression   . GERD (gastroesophageal reflux disease)   . HLD (hyperlipidemia)   . Hypothyroidism   . Anxiety   . Celiac disease     . Recurrent HSV (herpes simplex virus)   . Fibula fracture   . Unspecified hemorrhoids without mention of complication   . Varicose veins of lower extremities with other complications   . Other B-complex deficiencies   . Hypertension   . Myalgia and myositis, unspecified   . Allergy     seasonal  . Arthritis    Past Surgical History  Procedure Date  . Total vaginal hysterectomy 10/98  . Other surgical history     laser surgery for endometriosis  . Nasal sinus surgery   . Mandible surgery   . Esophagogastroduodenoscopy 1/04    normal  . Colonoscopy 11/00    biopsy negative  . Fibula fracture surgery    History  Substance Use Topics  . Smoking status: Never Smoker   . Smokeless tobacco: Never Used  . Alcohol Use: Yes     Comment: Very rare   Family History  Problem Relation Age of Onset  . Colon polyps Father   . Hypertension Father   . Asthma Mother   . Hypertension Mother   . Irritable bowel syndrome Mother   . Colon cancer Maternal Grandmother   . Uterine cancer    .  Kidney disease     Allergies  Allergen Reactions  . Iodine     REACTION: SOB with injectable iodine  . Iohexol   . Klonopin (Clonazepam)     Over sedated    Current Outpatient Prescriptions on File Prior to Visit  Medication Sig Dispense Refill  . albuterol (PROVENTIL HFA;VENTOLIN HFA) 108 (90 BASE) MCG/ACT inhaler Inhale 2 puffs into the lungs every 6 (six) hours as needed.        . ALPRAZolam (XANAX) 0.5 MG tablet Take 1 tablet (0.5 mg total) by mouth 3 (three) times daily as needed.  90 tablet  3  . baclofen (LIORESAL) 20 MG tablet Take 1 tablet (20 mg total) by mouth 3 (three) times daily as needed.  90 each  3  . cloNIDine (CATAPRES) 0.1 MG tablet Take 0.1 mg by mouth 2 (two) times daily.      . dapsone 25 MG tablet Take 100 mg by mouth daily.       . DULoxetine (CYMBALTA) 60 MG capsule Take 90 mg by mouth daily.      . fluticasone (FLONASE) 50 MCG/ACT nasal spray Place 2 sprays into the  nose daily.  1 g  6  . Fluticasone-Salmeterol (ADVAIR) 100-50 MCG/DOSE AEPB Inhale 1 puff into the lungs every 12 (twelve) hours.        . hyoscyamine (LEVSIN SL) 0.125 MG SL tablet Place 1 tablet (0.125 mg total) under the tongue every 4 (four) hours as needed.  30 tablet  6  . levothyroxine (SYNTHROID, LEVOTHROID) 125 MCG tablet Take 125 mcg by mouth daily.       Marland Kitchen loratadine (CLARITIN) 10 MG tablet Take 10 mg by mouth daily.        . metoprolol succinate (TOPROL-XL) 50 MG 24 hr tablet Take 1 tablet (50 mg total) by mouth daily. Take with or immediately following a meal.  30 tablet  11  . valACYclovir (VALTREX) 500 MG tablet Take 1 tablet (500 mg total) by mouth daily as needed.  30 tablet  3  . ARIPiprazole (ABILIFY) 5 MG tablet Take 2.5 mg by mouth daily.          Review of Systems Review of Systems  Constitutional: Negative for fever, appetite change, fatigue and unexpected weight change.  Eyes: Negative for pain and visual disturbance.  Respiratory: Negative for cough and shortness of breath.   Cardiovascular: Negative for palpitations /pnd/orthopnea or pedal edema  Gastrointestinal: Negative for nausea, diarrhea and constipation.  Genitourinary: Negative for urgency and frequency.  Skin: Negative for pallor or rash   Neurological: Negative for weakness, light-headedness, numbness and headaches.  Hematological: Negative for adenopathy. Does not bruise/bleed easily.  Psychiatric/Behavioral: pos for dysphoric mood and anxiety       Objective:   Physical Exam  Constitutional: She appears well-developed and well-nourished. No distress.       obese and well appearing   HENT:  Head: Normocephalic and atraumatic.  Mouth/Throat: Oropharynx is clear and moist.  Eyes: Conjunctivae normal and EOM are normal. Pupils are equal, round, and reactive to light. Right eye exhibits no discharge. Left eye exhibits no discharge. No scleral icterus.  Neck: Normal range of motion. Neck supple. No  JVD present. Carotid bruit is not present. No thyromegaly present.  Cardiovascular: Normal rate, regular rhythm, normal heart sounds and intact distal pulses.  Exam reveals no gallop.   Pulmonary/Chest: Effort normal and breath sounds normal. No respiratory distress. She has no wheezes. She has no rales.  She exhibits no tenderness.  Abdominal: Soft. Bowel sounds are normal. She exhibits no distension, no abdominal bruit and no mass. There is no tenderness.  Musculoskeletal: She exhibits no edema.       No leg tenderness or swelling   Lymphadenopathy:    She has no cervical adenopathy.  Neurological: She is alert. She has normal reflexes. No cranial nerve deficit. She exhibits normal muscle tone. Coordination normal.  Skin: Skin is warm and dry. No rash noted. No pallor.  Psychiatric: Judgment and thought content normal. Her mood appears anxious. Her affect is not blunt, not labile and not inappropriate. Her speech is delayed. Her speech is not rapid and/or pressured and not slurred. She is slowed. Thought content is not paranoid and not delusional. Cognition and memory are normal. She exhibits a depressed mood. She expresses no homicidal and no suicidal ideation. She expresses no suicidal plans and no homicidal plans.       Pt talks freely about stressors and her psychiatric history She is attentive.          Assessment & Plan:

## 2012-08-01 NOTE — Assessment & Plan Note (Signed)
Worse after setback/ commitment to psych hosp in nov  In process of med adj again by psychiatrist and finding new counselor [facility] is good  Closely followed at this time

## 2012-08-01 NOTE — Assessment & Plan Note (Signed)
Non exertional cp with reassuring exam and EKG Suspect related to anxiety and medicine changes  Will continue to watch carefully Per pt - recent thyroid check was good as well

## 2012-08-01 NOTE — Patient Instructions (Addendum)
EKG is normal today- that is very reassuring  The chest pain is most likely related to anxiety - but just the same if it worsens or you develop new symptoms or exertional symptoms let me know  Try to stay as active as you can be  Continue psychiatric follow up and if you need a counseling referral let me know

## 2012-08-02 ENCOUNTER — Other Ambulatory Visit (HOSPITAL_BASED_OUTPATIENT_CLINIC_OR_DEPARTMENT_OTHER): Payer: Self-pay | Admitting: Rheumatology

## 2012-08-02 ENCOUNTER — Ambulatory Visit (HOSPITAL_BASED_OUTPATIENT_CLINIC_OR_DEPARTMENT_OTHER): Payer: Managed Care, Other (non HMO)

## 2012-08-02 DIAGNOSIS — M461 Sacroiliitis, not elsewhere classified: Secondary | ICD-10-CM

## 2012-08-04 ENCOUNTER — Ambulatory Visit (HOSPITAL_BASED_OUTPATIENT_CLINIC_OR_DEPARTMENT_OTHER)
Admission: RE | Admit: 2012-08-04 | Discharge: 2012-08-04 | Disposition: A | Discharge status: Home / Self Care | Payer: Managed Care, Other (non HMO) | Source: Ambulatory Visit | Attending: Rheumatology | Admitting: Rheumatology

## 2012-08-04 DIAGNOSIS — M545 Low back pain, unspecified: Secondary | ICD-10-CM | POA: Insufficient documentation

## 2012-08-04 DIAGNOSIS — M461 Sacroiliitis, not elsewhere classified: Secondary | ICD-10-CM

## 2012-08-04 DIAGNOSIS — N949 Unspecified condition associated with female genital organs and menstrual cycle: Secondary | ICD-10-CM | POA: Insufficient documentation

## 2012-08-23 ENCOUNTER — Telehealth: Payer: Self-pay | Admitting: Family Medicine

## 2012-08-23 NOTE — Telephone Encounter (Signed)
I cannot do narcotic pain med but I can give her valtrex if she is out -- is she taking it? If so how much/ how often?

## 2012-08-23 NOTE — Telephone Encounter (Signed)
Patient Information:  Caller Name: Raygan  Phone: [phone]  Patient: Morgan [name], Morgan [name]  Gender: Female  DOB: 1960/12/21  Age: 52 Years  PCP: [facility] [employer] [employer] [facility])  Pregnant: No  Office Follow Up:  Does the office need to follow up with this patient?: No  Instructions For The Office: Requesting medication for pain associated with Herpes Cecile Hearing that she would need to be seen in the office. She will call back on 08/24/12 for appointment- does not have transportation today.    Symptoms  Reason For Call & Symptoms: Under a lot of stress - started with Herpes/ Shingles Outbreak on R lower back and nerve pain/ burning  in R leg. Patch in same place as when checked lin the office a few months ago. Requesting Hydrocodone to help with pain.  Reviewed Health History In EMR: Yes  Reviewed Medications In EMR: Yes  Reviewed Allergies In EMR: Yes  Reviewed Surgeries / Procedures: Yes  Date of Onset of Symptoms: 08/21/2012  Treatments Tried: Motrin 200 mgs 4 PO every 6- 8 hours prn and Tylenol 500 mgs 2 tabs PO every 8 hours prn, Benedryl, Cool Compresses  Treatments Tried Worked: No OB / GYN:  LMP: Unknown  Guideline(s) Used:  Rash or Redness - Localized  Disposition Per Guideline:   See Today in Office  Reason For Disposition Reached:   Painful rash and has multiple small blisters grouped together in one area of body (i.e., dermatomal distribution or "band" or "stripe")  Advice Given:  Hydrocortisone Cream for Itching:   Keep the cream in the refrigerator (Reason: it feels better if applied cold).  Available over-the-counter in [place] as 0.5% and 1% cream.  CAUTION: Do not use hydrocortisone cream on suspected athlete's foot, jock itch, ringworm, or impetigo.  Expected Course:  Most of these rashes pass in 2 to 3 days.  Call Back If:  Rash spreads or becomes worse  Rash lasts longer than 1 week  You become worse.  Patient Refused Recommendation:  Patient  Requests Prescription  Seen before for same symptoms. Requesting pain medication. Advised to call back for appointment.

## 2012-08-23 NOTE — Telephone Encounter (Signed)
Pt notified and she has enough valtrex so didn't need refill, due to bad weather coming pt didn't want to make an appt then have to cancel it so will call back to scheduled an appt once the weather is better

## 2012-08-23 NOTE — Telephone Encounter (Signed)
Left voicemail requesting pt to call office 

## 2012-09-02 ENCOUNTER — Telehealth: Payer: Self-pay | Admitting: Family Medicine

## 2012-09-02 NOTE — Telephone Encounter (Signed)
Patient Information:  Caller Name: Mahealani  Phone: [phone]  Patient: Morgan [name], Morgan [name]  Gender: Female  DOB: 01/16/1961  Age: 52 Years  PCP: Roxy Manns [facility])  Pregnant: No  Office Follow Up:  Does the office need to follow up with this patient?: Yes  Instructions For The Office: Directed to ER  RN Note:  [facility] is closest to patient. She is going to call EMS.  Husband is with her.  Care advise provided. Advised ASA 160-325mg  while waiting for paramedics.  Symptoms  Reason For Call & Symptoms: Patient states she has been chest pain for a "couple of days".  Described as constant pressure "like someone sitting on her chest , can't get a full breath. Left sided chest pain and in back, occasional left arm discomfort.  Patient with hx of HTN, Obesity and grandfather with MI  Reviewed Health History In EMR: N/A  Reviewed Medications In EMR: N/A  Reviewed Allergies In EMR: N/A  Reviewed Surgeries / Procedures: N/A  Date of Onset of Symptoms: 08/31/2012 OB / GYN:  LMP: Unknown  Guideline(s) Used:  Chest Pain  Disposition Per Guideline:   Call EMS 911 Now  Reason For Disposition Reached:   Chest pain lasting longer than 5 minutes and ANY of the following:  Over 70 years old Over 9 years old and at least one cardiac risk factor (i.e., high blood pressure, diabetes, high cholesterol, obesity, smoker or strong family history of heart disease) Pain is crushing, pressure-like, or heavy  Took nitroglycerin and chest pain was not relieved History of heart disease (i.e., angina, heart attack, bypass surgery, angioplasty, CHF)  Advice Given:  N/A

## 2012-09-02 NOTE — Telephone Encounter (Signed)
It sounds like she went to [facility] - please send for records when they are avail so I can keep informed, thanks

## 2012-09-05 NOTE — Telephone Encounter (Signed)
Forms received an placed in your inbox

## 2012-09-05 NOTE — Telephone Encounter (Signed)
Called hospital and requested records

## 2012-09-07 ENCOUNTER — Ambulatory Visit: Payer: Self-pay | Admitting: Family Medicine

## 2012-09-12 ENCOUNTER — Ambulatory Visit: Payer: Self-pay | Admitting: Family Medicine

## 2012-09-14 NOTE — Telephone Encounter (Signed)
Caller: Morgan [name]/Patient; Phone: [phone]; Reason for Call: Pt calling today 09/14/12 regarding she wants to know if Dr.  Milinda Antis can send the notes regarding her ED visit 09/02/12.  Was discharged on 09/04/12.  Would like this information sent to her rheumatologist Dr.  Clemens Catholic, office number [phone].  Thanks.

## 2012-09-14 NOTE — Telephone Encounter (Signed)
Notes faxed to Dr. Corliss Skains and placed back on your desk

## 2012-09-14 NOTE — Telephone Encounter (Signed)
Yes- I put them in the IN box to send - then please return to my desk so I have them for her f/u with me -thanks

## 2012-09-16 ENCOUNTER — Ambulatory Visit: Payer: Self-pay | Admitting: Family Medicine

## 2012-09-19 ENCOUNTER — Ambulatory Visit: Payer: Self-pay | Admitting: Family Medicine

## 2013-05-03 ENCOUNTER — Other Ambulatory Visit: Payer: Self-pay | Admitting: Family Medicine

## 2013-05-03 MED ORDER — METOPROLOL SUCCINATE ER 50 MG PO TB24: 50.0000 mg | ORAL_TABLET | Freq: Every day | ORAL | Status: DC

## 2013-05-18 ENCOUNTER — Other Ambulatory Visit: Payer: Self-pay

## 2013-06-07 ENCOUNTER — Other Ambulatory Visit: Payer: Self-pay | Admitting: *Deleted

## 2013-06-07 NOTE — Telephone Encounter (Signed)
Last office visit 08/01/2012.  Ok to refill?

## 2013-06-07 NOTE — Telephone Encounter (Signed)
Please schedule follow up when able and refill until then

## 2013-06-12 NOTE — Telephone Encounter (Signed)
Left voicemail requesting pt to call office 

## 2013-06-21 NOTE — Telephone Encounter (Signed)
Pt has moved and has a new PCP, Rx declined

## 2014-01-04 ENCOUNTER — Other Ambulatory Visit: Payer: Self-pay | Admitting: *Deleted

## 2014-02-14 ENCOUNTER — Encounter: Payer: Self-pay | Admitting: Vascular Surgery

## 2014-02-15 ENCOUNTER — Encounter: Payer: Self-pay | Admitting: Vascular Surgery

## 2014-02-15 ENCOUNTER — Ambulatory Visit (INDEPENDENT_AMBULATORY_CARE_PROVIDER_SITE_OTHER): Payer: Managed Care, Other (non HMO) | Admitting: Vascular Surgery

## 2014-02-15 VITALS — BP 103/50 | HR 84 | Ht 64.0 in | Wt 202.6 lb

## 2014-02-15 DIAGNOSIS — R29898 Other symptoms and signs involving the musculoskeletal system: Secondary | ICD-10-CM

## 2014-02-15 DIAGNOSIS — Z01810 Encounter for preprocedural cardiovascular examination: Secondary | ICD-10-CM

## 2014-02-15 DIAGNOSIS — R209 Unspecified disturbances of skin sensation: Secondary | ICD-10-CM

## 2014-02-15 DIAGNOSIS — T508X5D Adverse effect of diagnostic agents, subsequent encounter: Secondary | ICD-10-CM

## 2014-02-15 DIAGNOSIS — R200 Anesthesia of skin: Secondary | ICD-10-CM

## 2014-02-15 DIAGNOSIS — Z5189 Encounter for other specified aftercare: Secondary | ICD-10-CM

## 2014-02-15 MED ORDER — DIPHENHYDRAMINE HCL 50 MG PO CAPS: ORAL_CAPSULE | ORAL | Status: DC

## 2014-02-15 MED ORDER — PREDNISONE 50 MG PO TABS: ORAL_TABLET | ORAL | Status: DC

## 2014-02-15 NOTE — Patient Instructions (Signed)
For Contrast Dye Allergy: Starting 13 hrs prior to your scan  Take one Prednisone 50mg  tablet 13 hours prior Take one Prednisone 50 mg tablet 7 hours prior  Take one Prednisone 50 mg tablet 1 hour prior to scan AND take two Benadryl 25mg  tablets 1 hour prior to scan.

## 2014-02-15 NOTE — Progress Notes (Signed)
HISTORY AND PHYSICAL     CC:  Left arm pain  Morgan Dunnings, NP  HPI: This is a 53 y.o. female who presents today with complaints of left arm pain.  She states that this has been going on for ~ 1 year.  She did have an ultrasound at that time that ruled out DVT.  She states the pain is has worsened.  She says that she has veins in her left arm that are more prominent than the right arm.  She states the severity of this comes and goes.  She does have limited ROM.    She has a sister that is a PA and she was concerned that she had thoracic outlet syndrome and was referred to an orthopedic surgeon.  She had an MRI, which she says was normal.  They then proceeded with an EMG, which revealed carpal tunnel disease.  She states that she has numbness of her 4th and 5th fingers.    In the past year, she had chest pain and SOB and presented to the ED and a heart attack was ruled out.  She was told that she had pleurisy.  She again presented to the ED after that for chest pain and left arm pain and was again ruled out for MI per pt.  She presents today for further evaluation.  Her breathing is very labored and she states she was worked up in the ED.  States she is SOB from her chest pain.  She denies any hx of central catheters or surgery to her left arm/shoulder.   She states that her PCP has told her that she has had a high platelet count on the past 3 occasions and is considering sending her to a hematologist.  She is on a beta blocker for HTN.  She is not on a statin.  Past Medical History  Diagnosis Date  . Asthma   . Depression   . GERD (gastroesophageal reflux disease)   . HLD (hyperlipidemia)   . Hypothyroidism   . Anxiety   . Celiac disease   . Recurrent HSV (herpes simplex virus)   . Fibula fracture   . Unspecified hemorrhoids without mention of complication   . Varicose veins of lower extremities with other complications   . Other B-complex deficiencies   . Hypertension   .  Myalgia and myositis, unspecified   . Allergy     seasonal  . Arthritis   . Anemia    Past Surgical History  Procedure Laterality Date  . Total vaginal hysterectomy  10/98  . Other surgical history      laser surgery for endometriosis  . Nasal sinus surgery    . Mandible surgery    . Esophagogastroduodenoscopy  1/04    normal  . Colonoscopy  11/00    biopsy negative  . Fibula fracture surgery    . Abdominal hysterectomy      Allergies  Allergen Reactions  . Iodine     REACTION: SOB with injectable iodine  . Iohexol   . Klonopin [Clonazepam]     Over sedated     Current Outpatient Prescriptions  Medication Sig Dispense Refill  . albuterol (PROVENTIL HFA;VENTOLIN HFA) 108 (90 BASE) MCG/ACT inhaler Inhale 2 puffs into the lungs every 6 (six) hours as needed.        . ALPRAZolam (XANAX) 0.5 MG tablet Take 1 tablet (0.5 mg total) by mouth 3 (three) times daily as needed.  90 tablet  3  .  ARIPiprazole (ABILIFY) 5 MG tablet Take 2.5 mg by mouth daily.      . baclofen (LIORESAL) 20 MG tablet Take 1 tablet (20 mg total) by mouth 3 (three) times daily as needed.  90 each  3  . buPROPion (WELLBUTRIN XL) 300 MG 24 hr tablet Take 300 mg by mouth daily.      . carisoprodol (SOMA) 350 MG tablet Take 350 mg by mouth 3 (three) times daily.      . clonazePAM (KLONOPIN) 0.5 MG tablet Take 0.5 mg by mouth 2 (two) times daily.      . cloNIDine (CATAPRES) 0.1 MG tablet Take 0.1 mg by mouth 2 (two) times daily.      . dapsone 25 MG tablet Take 100 mg by mouth daily.       . DULoxetine (CYMBALTA) 60 MG capsule Take 90 mg by mouth daily.      . fluticasone (FLONASE) 50 MCG/ACT nasal spray Place 2 sprays into the nose daily.  1 g  6  . Fluticasone-Salmeterol (ADVAIR) 100-50 MCG/DOSE AEPB Inhale 1 puff into the lungs every 12 (twelve) hours.        Marland Kitchen HYDROcodone-acetaminophen (NORCO/VICODIN) 5-325 MG per tablet Take 1 tablet by mouth every 4 (four) hours as needed.      . hyoscyamine (LEVSIN SL)  0.125 MG SL tablet Place 1 tablet (0.125 mg total) under the tongue every 4 (four) hours as needed.  30 tablet  6  . levothyroxine (SYNTHROID, LEVOTHROID) 125 MCG tablet Take 125 mcg by mouth daily.       Marland Kitchen loratadine (CLARITIN) 10 MG tablet Take 10 mg by mouth daily.        . metoprolol succinate (TOPROL-XL) 50 MG 24 hr tablet Take 1 tablet (50 mg total) by mouth daily. Take with or immediately following meal. follow-up appt required before future refills are given  30 tablet  0  . oxymorphone (OPANA ER) 30 MG 12 hr tablet Take 30 mg by mouth every 12 (twelve) hours.      . traMADol (ULTRAM) 50 MG tablet Take 1 tablet by mouth as needed. 1-2 pills TID prn      . valACYclovir (VALTREX) 500 MG tablet Take 1 tablet (500 mg total) by mouth daily as needed.  30 tablet  3   No current facility-administered medications for this visit.    Family History  Problem Relation Age of Onset  . Colon polyps Father   . Hypertension Father   . Asthma Mother   . Hypertension Mother   . Irritable bowel syndrome Mother   . Cancer Mother   . Varicose Veins Mother   . Colon cancer Maternal Grandmother   . Uterine cancer    . Kidney disease      History   Social History  . Marital Status: Married    Spouse Name: N/A    Number of Children: 0  . Years of Education: N/A   Occupational History  . Environmental consultant    Social History Main Topics  . Smoking status: Never Smoker   . Smokeless tobacco: Never Used  . Alcohol Use: Yes     Comment: Very rare  . Drug Use: No  . Sexual Activity: Not on file   Other Topics Concern  . 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)     ROS: [x]  Positive   [ ]  Negative   [ ]  All sytems reviewed and are negative  Cardiovascular: [x]  chest pain/pressure []  palpitations []  SOB lying flat [x]  DOE []  pain in legs while walking []  pain in feet when lying flat []   hx of DVT []  hx of phlebitis []  swelling in legs []  varicose veins  Pulmonary: []  productive cough [x]  asthma []  wheezing  Neurologic: [x]  weakness in []  left arm []  legs [x]  numbness in arms legs [] difficulty speaking or slurred speech []  temporary loss of vision in one eye []  dizziness  Hematologic: []  bleeding problems []  problems with blood clotting easily  GI []  vomiting blood []  blood in stool  GU: []  burning with urination []  blood in urine  Psychiatric: [x]  hx of major depression  Integumentary: [x]  rashes []  ulcers  Constitutional: []  fever []  chills   PHYSICAL EXAMINATION:  Filed Vitals:   02/15/14 1020  BP: 103/50  Pulse: 84   Body mass index is 34.76 kg/(m^2).  General:  WDWN obese in mild distress Gait: walks slow with cane HENT: WNL, normocephalic; there are not prominent veins on pt's chest Pulmonary: labored breathing , without Rales, rhonchi,  wheezing Cardiac: RRR, without  Murmurs, rubs or gallops; without carotid bruits Abdomen: soft, NT, no masses Skin: without rashes, without ulcers, prominent veins left upper extremity compared to right no significant edema Vascular Exam/Pulses:  Right Left  Radial 2+ absent  Ulnar Not palpated absent  Brachial Not palpated absent   Extremities: without ischemic changes, without Gangrene , without cellulitis; without open wounds;  Musculoskeletal: no muscle wasting or atrophy; limited ROM of left arm Neurologic: A&O X 3; Appropriate Affect ; SENSATION: normal; MOTOR FUNCTION:  moving all extremities equally. Speech is fluent/normal   Non-Invasive Vascular Imaging:  None today  Pt meds includes: Statin:  No. Beta Blocker:  Yes.   Aspirin:  No. ACEI:  No. ARB:  No. Other Antiplatelet/Anticoagulant:  No.   ASSESSMENT/PLAN:: 53 y.o. female with c/o left arm pain with prominent veins present and limited range of motion.     -given pt's orthopedic workup is negative and pain has  worsened, will schedule her to have a CTA of the left chest and arm with special cuts to evaluate for thoracic outlet syndrome.  This will also evaluate if there are any tumors present causing compression of the vena cava.  Given that she does not have prominent veins on her chest, superior vena cava syndrome is unlikely.   She also has never had any central catheters placed in the past.  Chest xray evaluated from 2014 does not appear to have a cervical rib present.  Will see pt back after CTA is complete.   Leontine Locket, PA-C [facility] [phone]  Clinic MD:  Pt seen and examined in conjunction with Dr. Oneida Alar  Family test as above. The patient has prominent veins in her left upper extremity. This could be suggestive of a left subclavian vein occlusion. She does have some symptoms suggestive of possible thoracic outlet syndrome. However many of her upper extremity symptoms do not seem to be related or relevant to this. She complains of weakness in the left upper extremity and some joint pain. We will obtain a CT Angio with provocative maneuvers to see if she has a compression of her thoracic outlet this we'll also examine her left upper extremity arterial tree. When she returns for followup we will consider whether  or not she would benefit from central venogram.

## 2014-02-20 DIAGNOSIS — Z0279 Encounter for issue of other medical certificate: Secondary | ICD-10-CM | POA: Diagnosis not present

## 2014-02-26 ENCOUNTER — Telehealth: Payer: Self-pay

## 2014-02-26 NOTE — Telephone Encounter (Signed)
Message copied by Denman George on Mon Feb 26, 2014 11:18 AM ------      Message from: Steva Ready S      Created: Mon Feb 26, 2014 10:16 AM      Regarding: FW: CTA for Ruby Logiudice on 02/27/14       Charlynn Grimes,            Please see Dr. Oneida Alar note below. Let me know if you have any questions.            Thank you,            Debbie                   ----- Message -----         From: Elam Dutch, MD         Sent: 02/26/2014   9:42 AM           To: Orlene Plum      Subject: RE: CTA for Rosine Door on 02/27/14                    Have Zigmund Daniel or Arbie Cookey change it to a left central venogram in the [facility] lab on a Friday to rule out subclavian vein occlusion.            Charles      ----- Message -----         From: Orlene Plum         Sent: 02/22/2014  11:08 AM           To: Elam Dutch, MD      Subject: CTA for Rosine Door on 02/27/14                        Dr. Inez Pilgrim has denied the CTA of the chest stating it is not medically necessary.  If you would like a peer-to-peer, call [phone] [employer] and you will need patient's DOB Nov 26, 1960, the patient's  ID # [id number], and reference code of [id number] for CPT [phone].            If asked, your NPI is [id number].            If you need this appointment canceled or if you get the authorization, please let me know.            Thank you,            Debbie             ------

## 2014-02-27 ENCOUNTER — Ambulatory Visit (HOSPITAL_COMMUNITY): Payer: Managed Care, Other (non HMO)

## 2014-02-28 ENCOUNTER — Other Ambulatory Visit: Payer: Self-pay

## 2014-02-28 ENCOUNTER — Encounter (HOSPITAL_COMMUNITY): Payer: Self-pay | Admitting: Pharmacy Technician

## 2014-02-28 ENCOUNTER — Encounter: Payer: Self-pay | Admitting: Vascular Surgery

## 2014-02-28 ENCOUNTER — Telehealth: Payer: Self-pay | Admitting: Vascular Surgery

## 2014-02-28 NOTE — Telephone Encounter (Addendum)
Message copied by Gena Fray on Wed Feb 28, 2014 12:34 PM ------      Message from: Denman George      Created: Wed Feb 28, 2014  9:59 AM      Regarding: needs f/u appt. with [facility]      Contact: [phone]       Please change her appt. with Dr. Oneida Alar to 8/27 for f/u after venogram;   (her CTA was cancelled due to denial from insurance; she is scheduled for a left central veno 8/24)   ------  02/28/14: lm for pt re appt change, dpm

## 2014-03-01 ENCOUNTER — Ambulatory Visit: Payer: Self-pay | Admitting: Vascular Surgery

## 2014-03-06 ENCOUNTER — Encounter (HOSPITAL_COMMUNITY)
Admission: RE | Disposition: A | Discharge status: Home / Self Care | Payer: Self-pay | Source: Ambulatory Visit | Attending: Vascular Surgery

## 2014-03-06 ENCOUNTER — Ambulatory Visit (HOSPITAL_COMMUNITY)
Admission: RE | Admit: 2014-03-06 | Discharge: 2014-03-06 | Disposition: A | Discharge status: Home / Self Care | Payer: Managed Care, Other (non HMO) | Source: Ambulatory Visit | Attending: Vascular Surgery | Admitting: Vascular Surgery

## 2014-03-06 DIAGNOSIS — N186 End stage renal disease: Secondary | ICD-10-CM | POA: Diagnosis not present

## 2014-03-06 DIAGNOSIS — F3289 Other specified depressive episodes: Secondary | ICD-10-CM | POA: Insufficient documentation

## 2014-03-06 DIAGNOSIS — I120 Hypertensive chronic kidney disease with stage 5 chronic kidney disease or end stage renal disease: Secondary | ICD-10-CM | POA: Diagnosis not present

## 2014-03-06 DIAGNOSIS — J45909 Unspecified asthma, uncomplicated: Secondary | ICD-10-CM | POA: Insufficient documentation

## 2014-03-06 DIAGNOSIS — K219 Gastro-esophageal reflux disease without esophagitis: Secondary | ICD-10-CM | POA: Insufficient documentation

## 2014-03-06 DIAGNOSIS — E785 Hyperlipidemia, unspecified: Secondary | ICD-10-CM | POA: Diagnosis not present

## 2014-03-06 DIAGNOSIS — M79609 Pain in unspecified limb: Secondary | ICD-10-CM | POA: Insufficient documentation

## 2014-03-06 DIAGNOSIS — E039 Hypothyroidism, unspecified: Secondary | ICD-10-CM | POA: Diagnosis not present

## 2014-03-06 DIAGNOSIS — B009 Herpesviral infection, unspecified: Secondary | ICD-10-CM | POA: Diagnosis not present

## 2014-03-06 DIAGNOSIS — F411 Generalized anxiety disorder: Secondary | ICD-10-CM | POA: Diagnosis not present

## 2014-03-06 DIAGNOSIS — F329 Major depressive disorder, single episode, unspecified: Secondary | ICD-10-CM | POA: Diagnosis not present

## 2014-03-06 HISTORY — PX: VENOGRAM: SHX5497

## 2014-03-06 LAB — POCT I-STAT, CHEM 8
BUN: 9 mg/dL (ref 6–23)
Calcium, Ion: 1.22 mmol/L (ref 1.12–1.23)
Chloride: 104 mEq/L (ref 96–112)
Creatinine, Ser: 0.7 mg/dL (ref 0.50–1.10)
Glucose, Bld: 142 mg/dL — ABNORMAL HIGH (ref 70–99)
HCT: 42 % (ref 36.0–46.0)
Hemoglobin: 14.3 g/dL (ref 12.0–15.0)
Potassium: 4.8 mEq/L (ref 3.7–5.3)
SODIUM: 140 meq/L (ref 137–147)
TCO2: 26 mmol/L (ref 0–100)

## 2014-03-06 SURGERY — VENOGRAM
Anesthesia: LOCAL

## 2014-03-06 MED ORDER — SODIUM CHLORIDE 0.9 % IV SOLN
INTRAVENOUS | Status: DC
  Administered 2014-03-06: 1000 mL via INTRAVENOUS

## 2014-03-06 NOTE — Interval H&P Note (Signed)
[facility]   History and Physical Update  The patient was interviewed and re-examined.  The patient's previous History and Physical has been reviewed and is unchanged from Dr. Nona Dell consult.  There is no change in the plan of care: Left arm venogram.  Adele Barthel, MD [facility]: [phone] Pager: [phone]  03/06/2014, 7:31 AM

## 2014-03-06 NOTE — H&P (View-Only) (Signed)
HISTORY AND PHYSICAL     CC:  Left arm pain  Morgan Dunnings, NP  HPI: This is a 53 y.o. female who presents today with complaints of left arm pain.  She states that this has been going on for ~ 1 year.  She did have an ultrasound at that time that ruled out DVT.  She states the pain is has worsened.  She says that she has veins in her left arm that are more prominent than the right arm.  She states the severity of this comes and goes.  She does have limited ROM.    She has a sister that is a PA and she was concerned that she had thoracic outlet syndrome and was referred to an orthopedic surgeon.  She had an MRI, which she says was normal.  They then proceeded with an EMG, which revealed carpal tunnel disease.  She states that she has numbness of her 4th and 5th fingers.    In the past year, she had chest pain and SOB and presented to the ED and a heart attack was ruled out.  She was told that she had pleurisy.  She again presented to the ED after that for chest pain and left arm pain and was again ruled out for MI per pt.  She presents today for further evaluation.  Her breathing is very labored and she states she was worked up in the ED.  States she is SOB from her chest pain.  She denies any hx of central catheters or surgery to her left arm/shoulder.   She states that her PCP has told her that she has had a high platelet count on the past 3 occasions and is considering sending her to a hematologist.  She is on a beta blocker for HTN.  She is not on a statin.  Past Medical History  Diagnosis Date  . Asthma   . Depression   . GERD (gastroesophageal reflux disease)   . HLD (hyperlipidemia)   . Hypothyroidism   . Anxiety   . Celiac disease   . Recurrent HSV (herpes simplex virus)   . Fibula fracture   . Unspecified hemorrhoids without mention of complication   . Varicose veins of lower extremities with other complications   . Other B-complex deficiencies   . Hypertension   .  Myalgia and myositis, unspecified   . Allergy     seasonal  . Arthritis   . Anemia    Past Surgical History  Procedure Laterality Date  . Total vaginal hysterectomy  10/98  . Other surgical history      laser surgery for endometriosis  . Nasal sinus surgery    . Mandible surgery    . Esophagogastroduodenoscopy  1/04    normal  . Colonoscopy  11/00    biopsy negative  . Fibula fracture surgery    . Abdominal hysterectomy      Allergies  Allergen Reactions  . Iodine     REACTION: SOB with injectable iodine  . Iohexol   . Klonopin [Clonazepam]     Over sedated     Current Outpatient Prescriptions  Medication Sig Dispense Refill  . albuterol (PROVENTIL HFA;VENTOLIN HFA) 108 (90 BASE) MCG/ACT inhaler Inhale 2 puffs into the lungs every 6 (six) hours as needed.        . ALPRAZolam (XANAX) 0.5 MG tablet Take 1 tablet (0.5 mg total) by mouth 3 (three) times daily as needed.  90 tablet  3  .  ARIPiprazole (ABILIFY) 5 MG tablet Take 2.5 mg by mouth daily.      . baclofen (LIORESAL) 20 MG tablet Take 1 tablet (20 mg total) by mouth 3 (three) times daily as needed.  90 each  3  . buPROPion (WELLBUTRIN XL) 300 MG 24 hr tablet Take 300 mg by mouth daily.      . carisoprodol (SOMA) 350 MG tablet Take 350 mg by mouth 3 (three) times daily.      . clonazePAM (KLONOPIN) 0.5 MG tablet Take 0.5 mg by mouth 2 (two) times daily.      . cloNIDine (CATAPRES) 0.1 MG tablet Take 0.1 mg by mouth 2 (two) times daily.      . dapsone 25 MG tablet Take 100 mg by mouth daily.       . DULoxetine (CYMBALTA) 60 MG capsule Take 90 mg by mouth daily.      . fluticasone (FLONASE) 50 MCG/ACT nasal spray Place 2 sprays into the nose daily.  1 g  6  . Fluticasone-Salmeterol (ADVAIR) 100-50 MCG/DOSE AEPB Inhale 1 puff into the lungs every 12 (twelve) hours.        Marland Kitchen HYDROcodone-acetaminophen (NORCO/VICODIN) 5-325 MG per tablet Take 1 tablet by mouth every 4 (four) hours as needed.      . hyoscyamine (LEVSIN SL)  0.125 MG SL tablet Place 1 tablet (0.125 mg total) under the tongue every 4 (four) hours as needed.  30 tablet  6  . levothyroxine (SYNTHROID, LEVOTHROID) 125 MCG tablet Take 125 mcg by mouth daily.       Marland Kitchen loratadine (CLARITIN) 10 MG tablet Take 10 mg by mouth daily.        . metoprolol succinate (TOPROL-XL) 50 MG 24 hr tablet Take 1 tablet (50 mg total) by mouth daily. Take with or immediately following meal. follow-up appt required before future refills are given  30 tablet  0  . oxymorphone (OPANA ER) 30 MG 12 hr tablet Take 30 mg by mouth every 12 (twelve) hours.      . traMADol (ULTRAM) 50 MG tablet Take 1 tablet by mouth as needed. 1-2 pills TID prn      . valACYclovir (VALTREX) 500 MG tablet Take 1 tablet (500 mg total) by mouth daily as needed.  30 tablet  3   No current facility-administered medications for this visit.    Family History  Problem Relation Age of Onset  . Colon polyps Father   . Hypertension Father   . Asthma Mother   . Hypertension Mother   . Irritable bowel syndrome Mother   . Cancer Mother   . Varicose Veins Mother   . Colon cancer Maternal Grandmother   . Uterine cancer    . Kidney disease      History   Social History  . Marital Status: Married    Spouse Name: N/A    Number of Children: 0  . Years of Education: N/A   Occupational History  . Environmental consultant    Social History Main Topics  . Smoking status: Never Smoker   . Smokeless tobacco: Never Used  . Alcohol Use: Yes     Comment: Very rare  . Drug Use: No  . Sexual Activity: Not on file   Other Topics Concern  . 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)     ROS: [x]  Positive   [ ]  Negative   [ ]  All sytems reviewed and are negative  Cardiovascular: [x]  chest pain/pressure []  palpitations []  SOB lying flat [x]  DOE []  pain in legs while walking []  pain in feet when lying flat []   hx of DVT []  hx of phlebitis []  swelling in legs []  varicose veins  Pulmonary: []  productive cough [x]  asthma []  wheezing  Neurologic: [x]  weakness in []  left arm []  legs [x]  numbness in arms legs [] difficulty speaking or slurred speech []  temporary loss of vision in one eye []  dizziness  Hematologic: []  bleeding problems []  problems with blood clotting easily  GI []  vomiting blood []  blood in stool  GU: []  burning with urination []  blood in urine  Psychiatric: [x]  hx of major depression  Integumentary: [x]  rashes []  ulcers  Constitutional: []  fever []  chills   PHYSICAL EXAMINATION:  Filed Vitals:   02/15/14 1020  BP: 103/50  Pulse: 84   Body mass index is 34.76 kg/(m^2).  General:  WDWN obese in mild distress Gait: walks slow with cane HENT: WNL, normocephalic; there are not prominent veins on pt's chest Pulmonary: labored breathing , without Rales, rhonchi,  wheezing Cardiac: RRR, without  Murmurs, rubs or gallops; without carotid bruits Abdomen: soft, NT, no masses Skin: without rashes, without ulcers, prominent veins left upper extremity compared to right no significant edema Vascular Exam/Pulses:  Right Left  Radial 2+ absent  Ulnar Not palpated absent  Brachial Not palpated absent   Extremities: without ischemic changes, without Gangrene , without cellulitis; without open wounds;  Musculoskeletal: no muscle wasting or atrophy; limited ROM of left arm Neurologic: A&O X 3; Appropriate Affect ; SENSATION: normal; MOTOR FUNCTION:  moving all extremities equally. Speech is fluent/normal   Non-Invasive Vascular Imaging:  None today  Pt meds includes: Statin:  No. Beta Blocker:  Yes.   Aspirin:  No. ACEI:  No. ARB:  No. Other Antiplatelet/Anticoagulant:  No.   ASSESSMENT/PLAN:: 53 y.o. female with c/o left arm pain with prominent veins present and limited range of motion.     -given pt's orthopedic workup is negative and pain has  worsened, will schedule her to have a CTA of the left chest and arm with special cuts to evaluate for thoracic outlet syndrome.  This will also evaluate if there are any tumors present causing compression of the vena cava.  Given that she does not have prominent veins on her chest, superior vena cava syndrome is unlikely.   She also has never had any central catheters placed in the past.  Chest xray evaluated from 2014 does not appear to have a cervical rib present.  Will see pt back after CTA is complete.   Leontine Locket, PA-C [facility] [phone]  Clinic MD:  Pt seen and examined in conjunction with Dr. Oneida Alar  Family test as above. The patient has prominent veins in her left upper extremity. This could be suggestive of a left subclavian vein occlusion. She does have some symptoms suggestive of possible thoracic outlet syndrome. However many of her upper extremity symptoms do not seem to be related or relevant to this. She complains of weakness in the left upper extremity and some joint pain. We will obtain a CT Angio with provocative maneuvers to see if she has a compression of her thoracic outlet this we'll also examine her left upper extremity arterial tree. When she returns for followup we will consider whether  or not she would benefit from central venogram.

## 2014-03-06 NOTE — Op Note (Signed)
    OPERATIVE NOTE   PROCEDURE: 1. left arm and central  venogram   PRE-OPERATIVE DIAGNOSIS: end stage renal disease  POST-OPERATIVE DIAGNOSIS: same as above   SURGEON: Adele Barthel, MD  ANESTHESIA: local  ESTIMATED BLOOD LOSS: 5 cc  FINDING(S): 1. Patent superior vena cava  2. Patent left innominate vein 3. Patent left subclavian vein and axillary vein 4. Patent brachial vein with interruption distally at the level of antecubitum  SPECIMEN(S):  None  CONTRAST: 15 cc  INDICATIONS: Morgan [name] is a 53 y.o. female who presents with prominent left arm veins with concern for venous compression centrally.  The patient is scheduled for left venogram to help determine the availability of proximal veins for permanent access placement.  The patient is aware the risks include but are not limited to: bleeding, infection, thrombosis of the cannulated access, and possible anaphylactic reaction to the contrast.  The patient is aware of the risks of the procedure and elects to proceed forward.  DESCRIPTION: After full informed written consent was obtained, the patient was brought back to the angiography suite and placed supine upon the angiography table.  The patient was connected to monitoring equipment.  The left antecubital IV was connected to IV extension tubing.  Hand injections were completed to image the arm veins and central venous structures, the findings of which are listed above.    COMPLICATIONS: none  CONDITION: stable  Adele Barthel, MD [facility] [place] Office: [phone] Pager: [phone]  03/06/2014 7:37 AM

## 2014-03-06 NOTE — Progress Notes (Signed)
Took benadryl 50 mgm, prednisone 50 mgm 8/24 1830 & 8/5 and 0030 and 0615

## 2014-03-06 NOTE — Progress Notes (Signed)
Pt received form venogram procedure alert and able to tolerate fluids.

## 2014-03-06 NOTE — Progress Notes (Signed)
Discharge instruction given per MD order. Pt was able to verbalize instructions.  Pt to car via wheelchair.

## 2014-03-06 NOTE — Discharge Instructions (Signed)
Venogram, Care After °Refer to this sheet in the next few weeks. These instructions provide you with information on caring for yourself after your procedure. Your health care provider may also give you more specific instructions. Your treatment has been planned according to current medical practices, but problems sometimes occur. Call your health care provider if you have any problems or questions after your procedure. °WHAT TO EXPECT AFTER THE PROCEDURE °After your procedure, it is typical to have the following sensations: °· Mild discomfort at the catheter insertion site. °HOME CARE INSTRUCTIONS  °· Take all medicines exactly as directed. °· Follow any prescribed diet. °· Follow instructions regarding both rest and physical activity. °· Drink more fluids for the first several days after the procedure in order to help flush dye from your kidneys. °SEEK MEDICAL CARE IF: °· You develop a rash. °· You have fever not controlled by medicine. °SEEK IMMEDIATE MEDICAL CARE IF: °· There is pain, drainage, bleeding, redness, swelling, warmth or a red streak at the site of the IV tube. °· The extremity where your IV tube was placed becomes discolored, numb, or cool. °· You have difficulty breathing or shortness of breath. °· You develop chest pain. °· You have excessive dizziness or fainting. °Document Released: 04/19/2013 Document Revised: 07/04/2013 Document Reviewed: 04/19/2013 °[employer]® Patient Information ©2015 [employer]. This information is not intended to replace advice given to you by your health care provider. Make sure you discuss any questions you have with your health care provider. ° °

## 2014-03-07 ENCOUNTER — Encounter: Payer: Self-pay | Admitting: Vascular Surgery

## 2014-03-08 ENCOUNTER — Other Ambulatory Visit: Payer: Self-pay | Admitting: *Deleted

## 2014-03-08 ENCOUNTER — Encounter: Payer: Self-pay | Admitting: Vascular Surgery

## 2014-03-08 ENCOUNTER — Ambulatory Visit (INDEPENDENT_AMBULATORY_CARE_PROVIDER_SITE_OTHER): Payer: Managed Care, Other (non HMO) | Admitting: Vascular Surgery

## 2014-03-08 VITALS — BP 100/74 | HR 91 | Ht 64.0 in | Wt 200.0 lb

## 2014-03-08 DIAGNOSIS — R531 Weakness: Secondary | ICD-10-CM

## 2014-03-08 DIAGNOSIS — R200 Anesthesia of skin: Secondary | ICD-10-CM

## 2014-03-08 DIAGNOSIS — R209 Unspecified disturbances of skin sensation: Secondary | ICD-10-CM

## 2014-03-08 HISTORY — DX: Anesthesia of skin: R20.0

## 2014-03-08 NOTE — Progress Notes (Signed)
The patient is a 53 year old female who returns for followup today for central venogram. I reviewed the central venogram images today. Her left subclavian venous system is widely patent with no significant narrowing. She still has complaints of her left upper extremity of early fatigue as well as prominence of the veins in her left forearm.  Review of systems: Patient still complains of dyspnea with exertion. She denies chest pain.  Physical exam:  Filed Vitals:   03/08/14 0925  BP: 100/74  Pulse: 91  Height: 5\' 4"  (1.626 m)  Weight: 200 lb (90.719 kg)  SpO2: 99%    Left upper extremity: 2+ brachial and radial pulse no significant edema left to right  Assessment: With a previous normal MRI and normal central venogram doubt her symptoms are necessarily related to thoracic outlet syndrome. Her primary complaint today was more for shortness of breath symptoms. I have encouraged her to followup with her primary care physician regarding this. Her shortness of breath symptoms are chronic. She had no acute worsening of this today.  Plan: I prescribed physical therapy for her today to try to increase her left upper extremity range of motion has well as treat her in the event that she does have thoracic outlet syndrome as physical therapy can frequently help the symptoms. She will followup with [place] on as-needed basis. She will continue to followup with her primary care physician for her other complaints.  Ruta Hinds, MD [facility] [place] Office: [phone] Pager: [phone]

## 2014-03-13 IMAGING — CR DG SACRUM/COCCYX 2+V
3 series · 3 of 3 positions shown · non-contrast
Comparison: None.

CLINICAL DATA: Evaluate for ankylosing spondylitis

SACRUM AND COCCYX - 2+ VIEW

[view not recorded (1 of 3)]
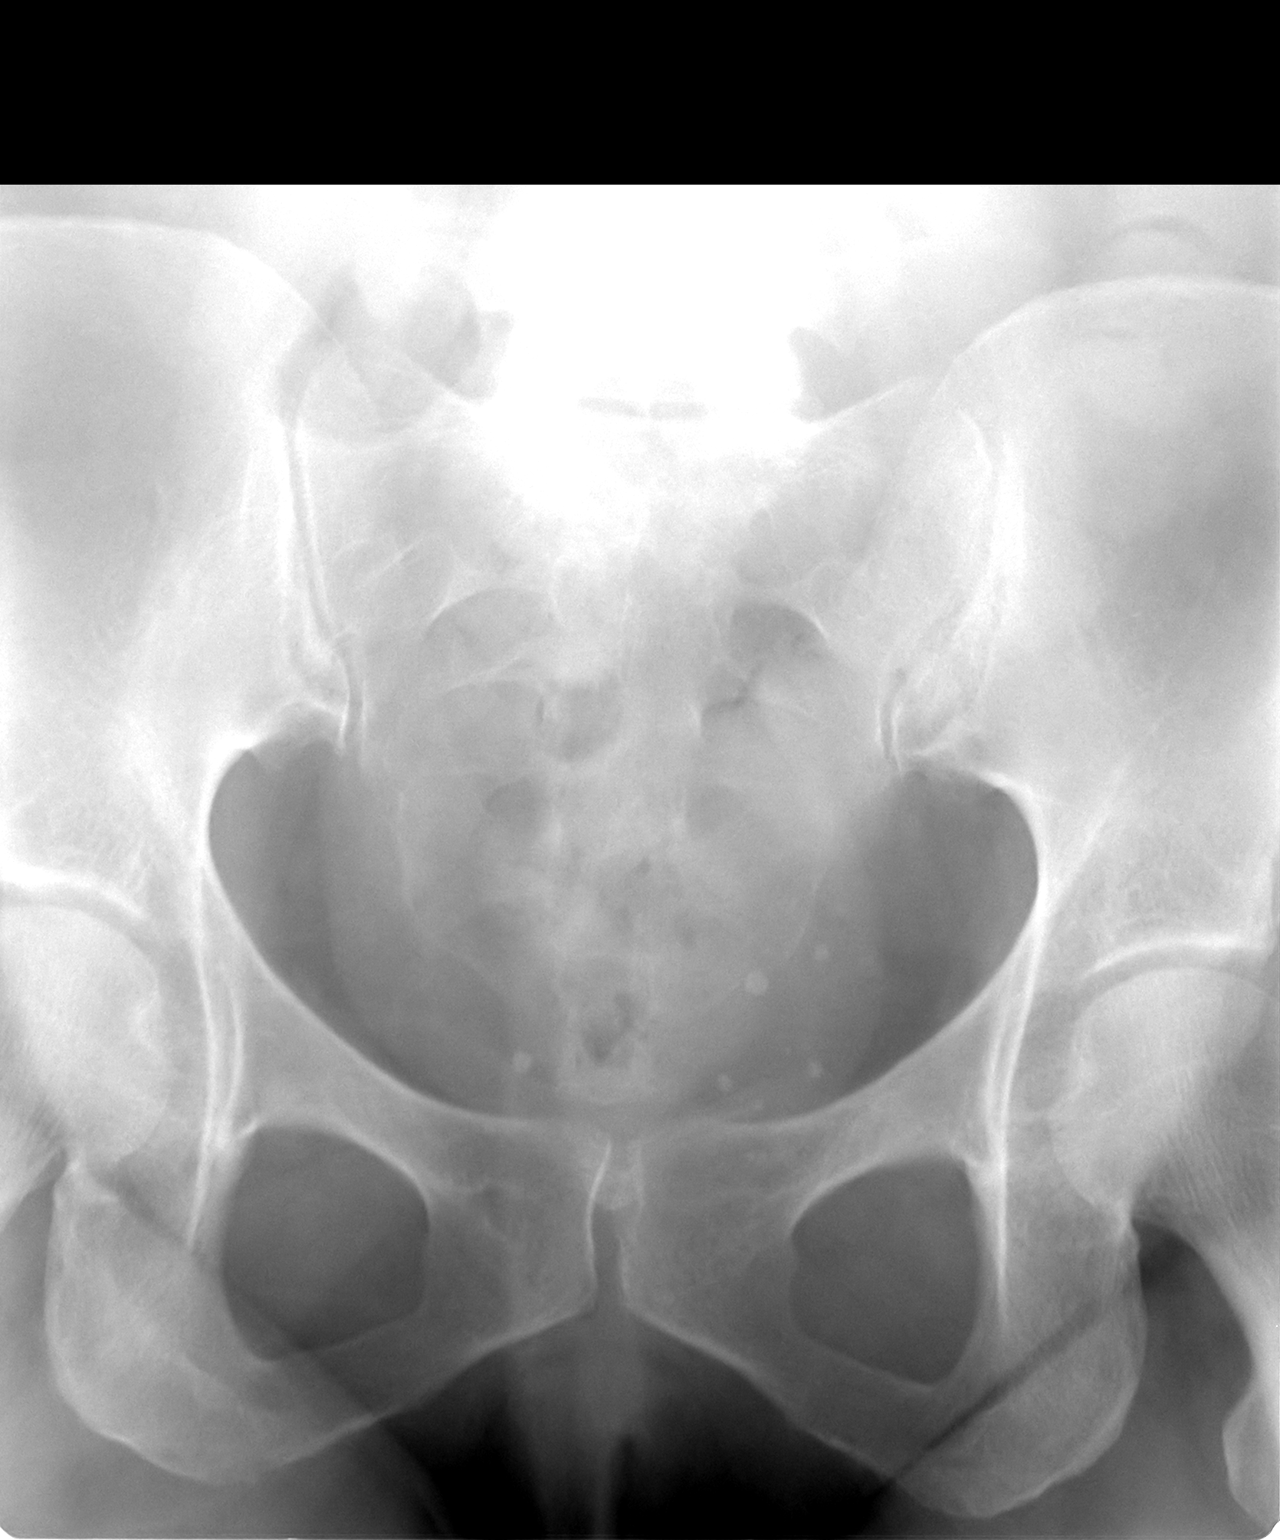

[view not recorded (2 of 3)]
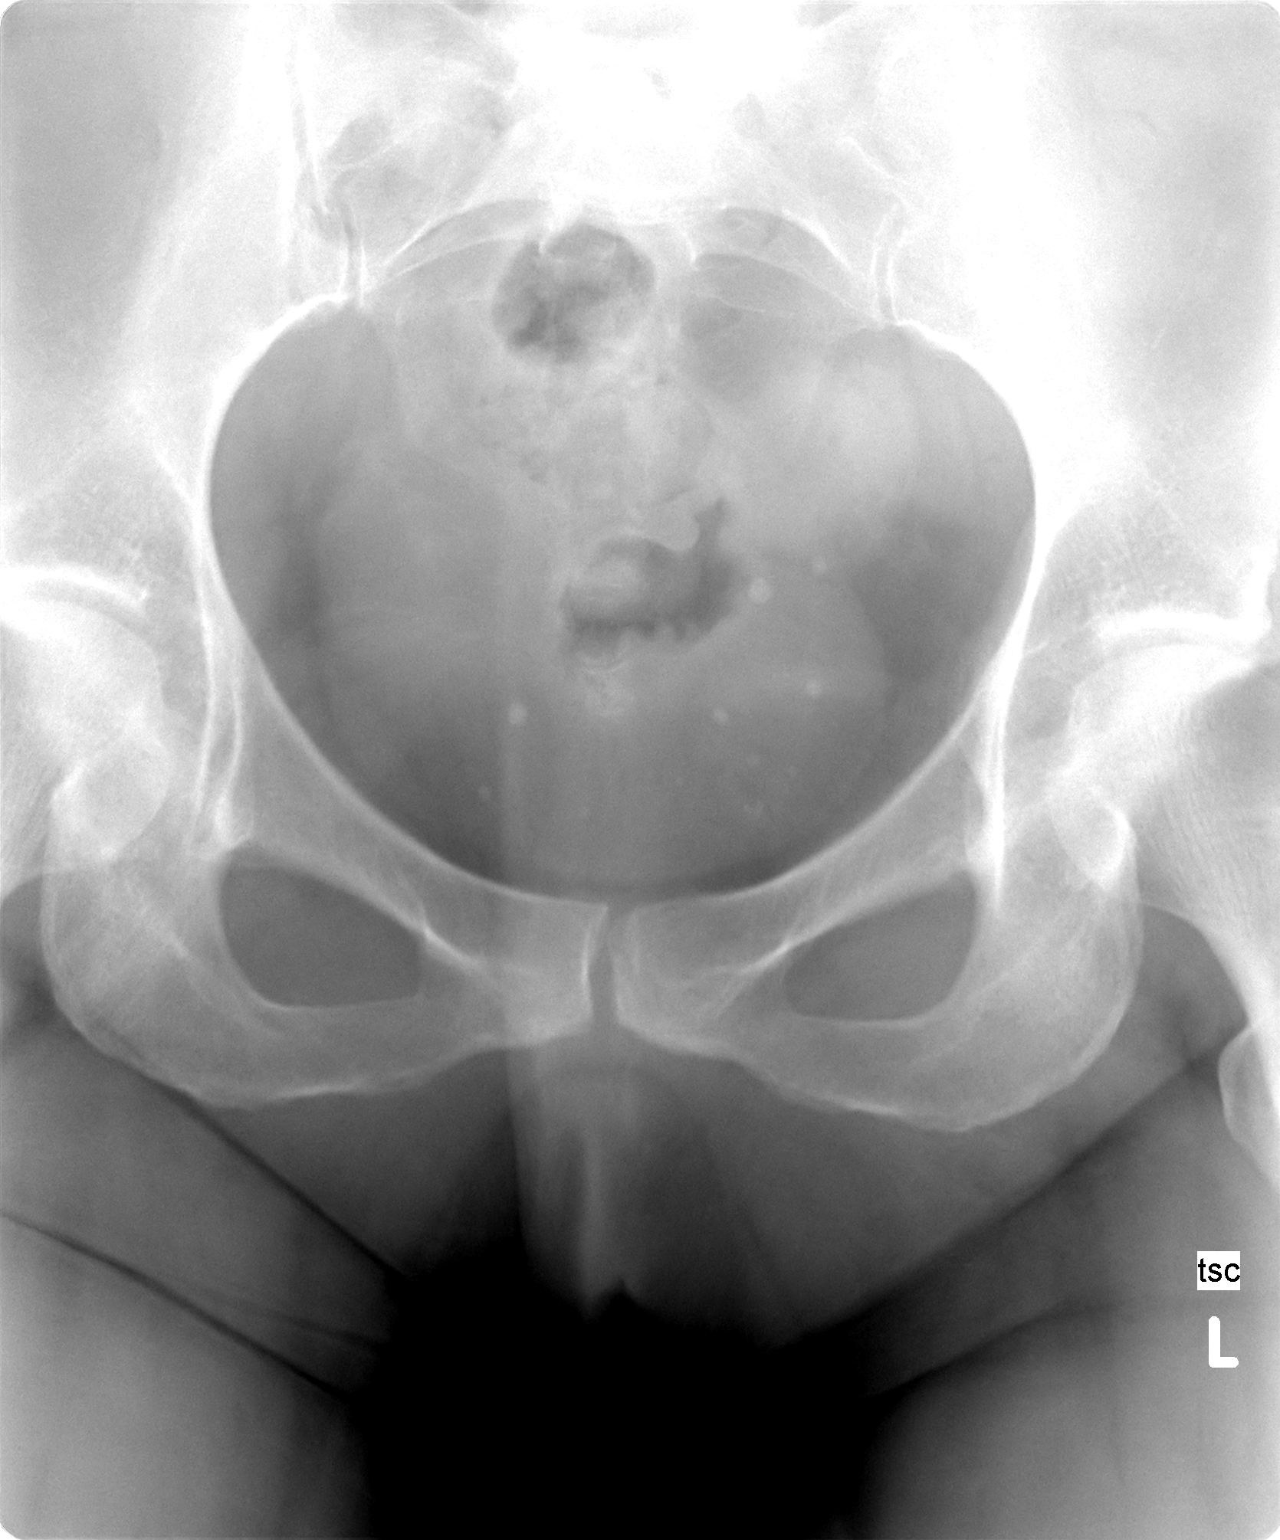

[view not recorded (3 of 3)]
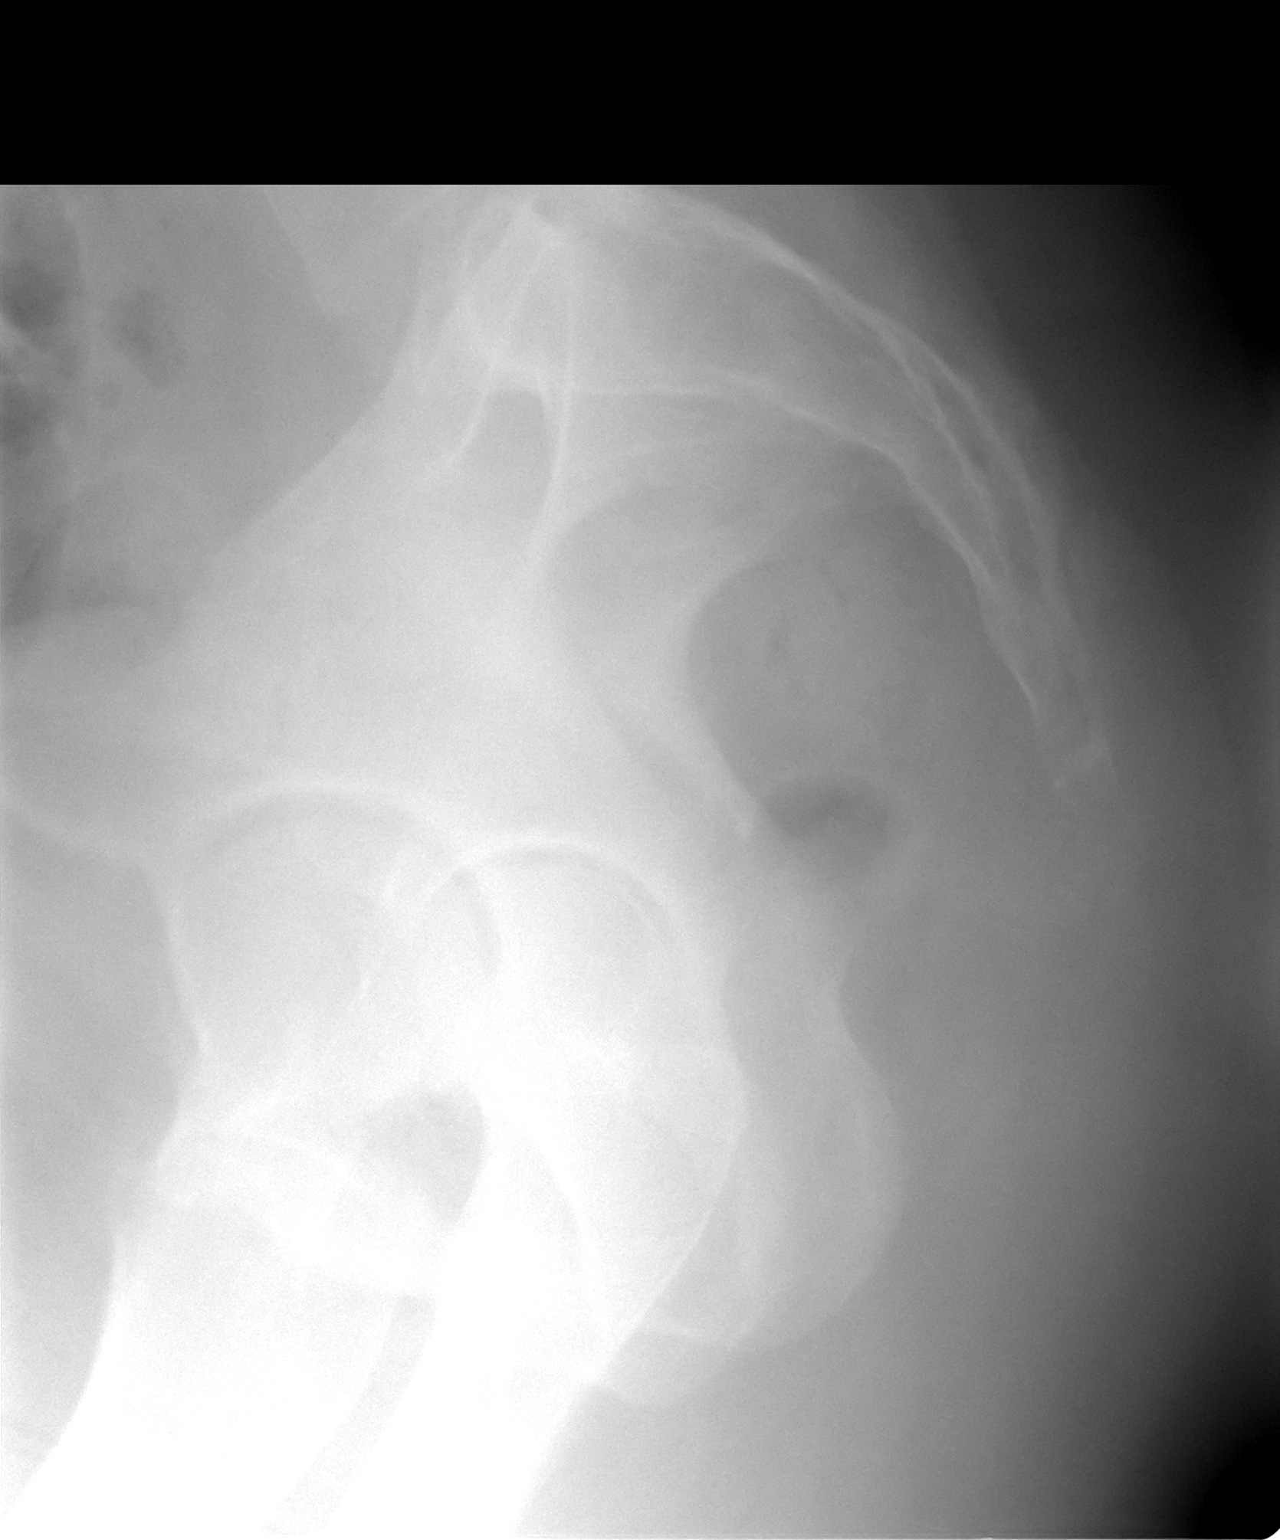

[3 of 3 positions shown; findings below may reference images not displayed]

FINDINGS: The SI joints appear normally corticated.  No plain film
evidence of sacroiliitis is seen.  The sacral coccygeal elements
are normally aligned on the lateral view.  The pelvic rami are
intact.
IMPRESSION: No evidence of sacroiliitis.

## 2014-04-10 DIAGNOSIS — S46812A Strain of other muscles, fascia and tendons at shoulder and upper arm level, left arm, initial encounter: Secondary | ICD-10-CM

## 2014-04-10 HISTORY — DX: Strain of other muscles, fascia and tendons at shoulder and upper arm level, left arm, initial encounter: S46.812A

## 2014-06-21 ENCOUNTER — Encounter (HOSPITAL_COMMUNITY): Payer: Self-pay | Admitting: Vascular Surgery

## 2014-08-17 DIAGNOSIS — M359 Systemic involvement of connective tissue, unspecified: Secondary | ICD-10-CM

## 2014-08-17 HISTORY — DX: Systemic involvement of connective tissue, unspecified: M35.9

## 2014-08-29 DIAGNOSIS — Z0279 Encounter for issue of other medical certificate: Secondary | ICD-10-CM

## 2015-01-24 ENCOUNTER — Encounter: Payer: Self-pay | Admitting: Internal Medicine

## 2015-05-22 DIAGNOSIS — G540 Brachial plexus disorders: Secondary | ICD-10-CM

## 2015-05-22 HISTORY — DX: Brachial plexus disorders: G54.0

## 2016-04-06 DIAGNOSIS — F411 Generalized anxiety disorder: Secondary | ICD-10-CM | POA: Diagnosis not present

## 2016-04-06 DIAGNOSIS — F330 Major depressive disorder, recurrent, mild: Secondary | ICD-10-CM | POA: Diagnosis not present

## 2016-04-06 DIAGNOSIS — E039 Hypothyroidism, unspecified: Secondary | ICD-10-CM | POA: Diagnosis not present

## 2016-04-06 DIAGNOSIS — M8580 Other specified disorders of bone density and structure, unspecified site: Secondary | ICD-10-CM | POA: Diagnosis not present

## 2016-04-06 DIAGNOSIS — E559 Vitamin D deficiency, unspecified: Secondary | ICD-10-CM | POA: Diagnosis not present

## 2016-06-09 DIAGNOSIS — J209 Acute bronchitis, unspecified: Secondary | ICD-10-CM | POA: Diagnosis not present

## 2016-06-30 DIAGNOSIS — M62838 Other muscle spasm: Secondary | ICD-10-CM | POA: Diagnosis not present

## 2016-06-30 DIAGNOSIS — F419 Anxiety disorder, unspecified: Secondary | ICD-10-CM | POA: Diagnosis not present

## 2016-06-30 DIAGNOSIS — E559 Vitamin D deficiency, unspecified: Secondary | ICD-10-CM | POA: Diagnosis not present

## 2016-09-24 DIAGNOSIS — E039 Hypothyroidism, unspecified: Secondary | ICD-10-CM | POA: Diagnosis not present

## 2016-09-24 DIAGNOSIS — J45909 Unspecified asthma, uncomplicated: Secondary | ICD-10-CM | POA: Diagnosis not present

## 2016-09-24 DIAGNOSIS — I10 Essential (primary) hypertension: Secondary | ICD-10-CM | POA: Diagnosis not present

## 2016-09-24 DIAGNOSIS — F4310 Post-traumatic stress disorder, unspecified: Secondary | ICD-10-CM | POA: Diagnosis not present

## 2016-09-24 DIAGNOSIS — K900 Celiac disease: Secondary | ICD-10-CM | POA: Diagnosis not present

## 2016-09-24 DIAGNOSIS — Z6839 Body mass index (BMI) 39.0-39.9, adult: Secondary | ICD-10-CM | POA: Diagnosis not present

## 2016-09-24 DIAGNOSIS — E559 Vitamin D deficiency, unspecified: Secondary | ICD-10-CM | POA: Diagnosis not present

## 2016-09-24 DIAGNOSIS — Z87891 Personal history of nicotine dependence: Secondary | ICD-10-CM | POA: Diagnosis not present

## 2016-09-24 DIAGNOSIS — Z5181 Encounter for therapeutic drug level monitoring: Secondary | ICD-10-CM | POA: Diagnosis not present

## 2016-09-24 DIAGNOSIS — R5383 Other fatigue: Secondary | ICD-10-CM | POA: Diagnosis not present

## 2016-09-24 DIAGNOSIS — Z6836 Body mass index (BMI) 36.0-36.9, adult: Secondary | ICD-10-CM | POA: Diagnosis not present

## 2016-09-24 DIAGNOSIS — Z0000 Encounter for general adult medical examination without abnormal findings: Secondary | ICD-10-CM | POA: Diagnosis not present

## 2016-09-24 DIAGNOSIS — L130 Dermatitis herpetiformis: Secondary | ICD-10-CM | POA: Diagnosis not present

## 2016-09-24 DIAGNOSIS — F419 Anxiety disorder, unspecified: Secondary | ICD-10-CM | POA: Diagnosis not present

## 2016-09-24 DIAGNOSIS — Z1389 Encounter for screening for other disorder: Secondary | ICD-10-CM | POA: Diagnosis not present

## 2016-12-14 DIAGNOSIS — R739 Hyperglycemia, unspecified: Secondary | ICD-10-CM | POA: Diagnosis not present

## 2017-02-26 DIAGNOSIS — F411 Generalized anxiety disorder: Secondary | ICD-10-CM | POA: Diagnosis not present

## 2017-02-26 DIAGNOSIS — F330 Major depressive disorder, recurrent, mild: Secondary | ICD-10-CM | POA: Diagnosis not present

## 2017-04-01 DIAGNOSIS — E039 Hypothyroidism, unspecified: Secondary | ICD-10-CM | POA: Diagnosis not present

## 2017-04-01 DIAGNOSIS — F411 Generalized anxiety disorder: Secondary | ICD-10-CM | POA: Diagnosis not present

## 2017-04-01 DIAGNOSIS — E7800 Pure hypercholesterolemia, unspecified: Secondary | ICD-10-CM | POA: Diagnosis not present

## 2017-06-17 DIAGNOSIS — L209 Atopic dermatitis, unspecified: Secondary | ICD-10-CM | POA: Diagnosis not present

## 2017-06-30 DIAGNOSIS — J00 Acute nasopharyngitis [common cold]: Secondary | ICD-10-CM | POA: Diagnosis not present

## 2017-06-30 DIAGNOSIS — F411 Generalized anxiety disorder: Secondary | ICD-10-CM | POA: Diagnosis not present

## 2017-07-29 DIAGNOSIS — R05 Cough: Secondary | ICD-10-CM | POA: Diagnosis not present

## 2017-07-29 DIAGNOSIS — S2242XA Multiple fractures of ribs, left side, initial encounter for closed fracture: Secondary | ICD-10-CM | POA: Diagnosis not present

## 2017-07-29 DIAGNOSIS — J45901 Unspecified asthma with (acute) exacerbation: Secondary | ICD-10-CM | POA: Diagnosis not present

## 2017-08-04 DIAGNOSIS — R05 Cough: Secondary | ICD-10-CM | POA: Diagnosis not present

## 2017-08-23 DIAGNOSIS — K219 Gastro-esophageal reflux disease without esophagitis: Secondary | ICD-10-CM | POA: Diagnosis not present

## 2017-08-23 DIAGNOSIS — K449 Diaphragmatic hernia without obstruction or gangrene: Secondary | ICD-10-CM | POA: Diagnosis not present

## 2017-08-23 DIAGNOSIS — J342 Deviated nasal septum: Secondary | ICD-10-CM | POA: Diagnosis not present

## 2017-08-23 DIAGNOSIS — R05 Cough: Secondary | ICD-10-CM | POA: Diagnosis not present

## 2017-08-23 DIAGNOSIS — J45901 Unspecified asthma with (acute) exacerbation: Secondary | ICD-10-CM | POA: Diagnosis not present

## 2017-09-06 DIAGNOSIS — E559 Vitamin D deficiency, unspecified: Secondary | ICD-10-CM | POA: Diagnosis not present

## 2017-09-06 DIAGNOSIS — J301 Allergic rhinitis due to pollen: Secondary | ICD-10-CM | POA: Diagnosis not present

## 2017-09-06 DIAGNOSIS — R05 Cough: Secondary | ICD-10-CM | POA: Diagnosis not present

## 2017-09-06 DIAGNOSIS — K219 Gastro-esophageal reflux disease without esophagitis: Secondary | ICD-10-CM | POA: Diagnosis not present

## 2017-09-13 DIAGNOSIS — R911 Solitary pulmonary nodule: Secondary | ICD-10-CM | POA: Diagnosis not present

## 2017-09-14 DIAGNOSIS — R071 Chest pain on breathing: Secondary | ICD-10-CM | POA: Diagnosis not present

## 2017-09-14 DIAGNOSIS — R062 Wheezing: Secondary | ICD-10-CM | POA: Diagnosis not present

## 2017-09-14 DIAGNOSIS — R05 Cough: Secondary | ICD-10-CM | POA: Diagnosis not present

## 2017-09-17 DIAGNOSIS — J45991 Cough variant asthma: Secondary | ICD-10-CM | POA: Diagnosis not present

## 2017-09-17 DIAGNOSIS — Z7689 Persons encountering health services in other specified circumstances: Secondary | ICD-10-CM | POA: Diagnosis not present

## 2017-09-17 DIAGNOSIS — J301 Allergic rhinitis due to pollen: Secondary | ICD-10-CM | POA: Diagnosis not present

## 2017-09-17 DIAGNOSIS — R05 Cough: Secondary | ICD-10-CM | POA: Diagnosis not present

## 2017-09-17 DIAGNOSIS — K219 Gastro-esophageal reflux disease without esophagitis: Secondary | ICD-10-CM | POA: Diagnosis not present

## 2017-09-24 DIAGNOSIS — J301 Allergic rhinitis due to pollen: Secondary | ICD-10-CM | POA: Diagnosis not present

## 2017-09-24 DIAGNOSIS — J45991 Cough variant asthma: Secondary | ICD-10-CM | POA: Diagnosis not present

## 2017-09-24 DIAGNOSIS — A3790 Whooping cough, unspecified species without pneumonia: Secondary | ICD-10-CM | POA: Diagnosis not present

## 2017-10-01 DIAGNOSIS — I10 Essential (primary) hypertension: Secondary | ICD-10-CM | POA: Diagnosis not present

## 2017-10-01 DIAGNOSIS — Z1389 Encounter for screening for other disorder: Secondary | ICD-10-CM | POA: Diagnosis not present

## 2017-10-01 DIAGNOSIS — Z0000 Encounter for general adult medical examination without abnormal findings: Secondary | ICD-10-CM | POA: Diagnosis not present

## 2017-10-01 DIAGNOSIS — E039 Hypothyroidism, unspecified: Secondary | ICD-10-CM | POA: Diagnosis not present

## 2017-10-01 DIAGNOSIS — E7800 Pure hypercholesterolemia, unspecified: Secondary | ICD-10-CM | POA: Diagnosis not present

## 2017-10-01 DIAGNOSIS — E559 Vitamin D deficiency, unspecified: Secondary | ICD-10-CM | POA: Diagnosis not present

## 2017-10-05 DIAGNOSIS — R05 Cough: Secondary | ICD-10-CM | POA: Diagnosis not present

## 2017-10-05 DIAGNOSIS — J45991 Cough variant asthma: Secondary | ICD-10-CM | POA: Diagnosis not present

## 2017-10-05 DIAGNOSIS — J301 Allergic rhinitis due to pollen: Secondary | ICD-10-CM | POA: Diagnosis not present

## 2017-10-05 DIAGNOSIS — A3790 Whooping cough, unspecified species without pneumonia: Secondary | ICD-10-CM | POA: Diagnosis not present

## 2017-10-19 DIAGNOSIS — A3790 Whooping cough, unspecified species without pneumonia: Secondary | ICD-10-CM | POA: Diagnosis not present

## 2017-10-19 DIAGNOSIS — R05 Cough: Secondary | ICD-10-CM | POA: Diagnosis not present

## 2017-10-19 DIAGNOSIS — J45991 Cough variant asthma: Secondary | ICD-10-CM | POA: Diagnosis not present

## 2017-11-08 DIAGNOSIS — R05 Cough: Secondary | ICD-10-CM | POA: Diagnosis not present

## 2017-11-08 DIAGNOSIS — J301 Allergic rhinitis due to pollen: Secondary | ICD-10-CM | POA: Diagnosis not present

## 2017-11-08 DIAGNOSIS — J45991 Cough variant asthma: Secondary | ICD-10-CM | POA: Diagnosis not present

## 2017-12-07 DIAGNOSIS — Z1231 Encounter for screening mammogram for malignant neoplasm of breast: Secondary | ICD-10-CM | POA: Diagnosis not present

## 2017-12-08 DIAGNOSIS — L820 Inflamed seborrheic keratosis: Secondary | ICD-10-CM | POA: Diagnosis not present

## 2017-12-08 DIAGNOSIS — L309 Dermatitis, unspecified: Secondary | ICD-10-CM | POA: Diagnosis not present

## 2017-12-08 DIAGNOSIS — D485 Neoplasm of uncertain behavior of skin: Secondary | ICD-10-CM | POA: Diagnosis not present

## 2017-12-20 DIAGNOSIS — J45991 Cough variant asthma: Secondary | ICD-10-CM | POA: Diagnosis not present

## 2017-12-20 DIAGNOSIS — R05 Cough: Secondary | ICD-10-CM | POA: Diagnosis not present

## 2017-12-20 DIAGNOSIS — J301 Allergic rhinitis due to pollen: Secondary | ICD-10-CM | POA: Diagnosis not present

## 2017-12-29 DIAGNOSIS — M8580 Other specified disorders of bone density and structure, unspecified site: Secondary | ICD-10-CM | POA: Diagnosis not present

## 2017-12-29 DIAGNOSIS — E039 Hypothyroidism, unspecified: Secondary | ICD-10-CM | POA: Diagnosis not present

## 2018-01-24 DIAGNOSIS — J301 Allergic rhinitis due to pollen: Secondary | ICD-10-CM | POA: Diagnosis not present

## 2018-01-24 DIAGNOSIS — R05 Cough: Secondary | ICD-10-CM | POA: Diagnosis not present

## 2018-01-24 DIAGNOSIS — R0781 Pleurodynia: Secondary | ICD-10-CM | POA: Diagnosis not present

## 2018-01-24 DIAGNOSIS — J45991 Cough variant asthma: Secondary | ICD-10-CM | POA: Diagnosis not present

## 2018-02-11 DIAGNOSIS — M62838 Other muscle spasm: Secondary | ICD-10-CM | POA: Diagnosis not present

## 2018-02-15 DIAGNOSIS — Z888 Allergy status to other drugs, medicaments and biological substances status: Secondary | ICD-10-CM | POA: Diagnosis not present

## 2018-02-15 DIAGNOSIS — G540 Brachial plexus disorders: Secondary | ICD-10-CM | POA: Diagnosis not present

## 2018-03-01 DIAGNOSIS — M6281 Muscle weakness (generalized): Secondary | ICD-10-CM | POA: Diagnosis not present

## 2018-03-01 DIAGNOSIS — R293 Abnormal posture: Secondary | ICD-10-CM | POA: Diagnosis not present

## 2018-03-01 DIAGNOSIS — G540 Brachial plexus disorders: Secondary | ICD-10-CM | POA: Diagnosis not present

## 2018-03-01 DIAGNOSIS — M2560 Stiffness of unspecified joint, not elsewhere classified: Secondary | ICD-10-CM | POA: Diagnosis not present

## 2018-03-02 DIAGNOSIS — M8589 Other specified disorders of bone density and structure, multiple sites: Secondary | ICD-10-CM | POA: Diagnosis not present

## 2018-03-02 DIAGNOSIS — N959 Unspecified menopausal and perimenopausal disorder: Secondary | ICD-10-CM | POA: Diagnosis not present

## 2018-03-15 DIAGNOSIS — M6281 Muscle weakness (generalized): Secondary | ICD-10-CM | POA: Diagnosis not present

## 2018-03-15 DIAGNOSIS — R293 Abnormal posture: Secondary | ICD-10-CM | POA: Diagnosis not present

## 2018-03-15 DIAGNOSIS — M2560 Stiffness of unspecified joint, not elsewhere classified: Secondary | ICD-10-CM | POA: Diagnosis not present

## 2018-03-15 DIAGNOSIS — G540 Brachial plexus disorders: Secondary | ICD-10-CM | POA: Diagnosis not present

## 2018-03-30 DIAGNOSIS — G588 Other specified mononeuropathies: Secondary | ICD-10-CM

## 2018-03-30 DIAGNOSIS — G540 Brachial plexus disorders: Secondary | ICD-10-CM | POA: Diagnosis not present

## 2018-03-30 HISTORY — DX: Other specified mononeuropathies: G58.8

## 2018-04-06 DIAGNOSIS — F330 Major depressive disorder, recurrent, mild: Secondary | ICD-10-CM | POA: Diagnosis not present

## 2018-04-06 DIAGNOSIS — F411 Generalized anxiety disorder: Secondary | ICD-10-CM | POA: Diagnosis not present

## 2018-04-18 DIAGNOSIS — M6281 Muscle weakness (generalized): Secondary | ICD-10-CM | POA: Diagnosis not present

## 2018-04-18 DIAGNOSIS — M2560 Stiffness of unspecified joint, not elsewhere classified: Secondary | ICD-10-CM | POA: Diagnosis not present

## 2018-04-18 DIAGNOSIS — R293 Abnormal posture: Secondary | ICD-10-CM | POA: Diagnosis not present

## 2018-04-18 DIAGNOSIS — G540 Brachial plexus disorders: Secondary | ICD-10-CM | POA: Diagnosis not present

## 2018-04-25 DIAGNOSIS — R0789 Other chest pain: Secondary | ICD-10-CM | POA: Diagnosis not present

## 2018-04-25 DIAGNOSIS — G588 Other specified mononeuropathies: Secondary | ICD-10-CM | POA: Diagnosis not present

## 2018-04-25 DIAGNOSIS — M792 Neuralgia and neuritis, unspecified: Secondary | ICD-10-CM | POA: Diagnosis not present

## 2018-04-25 DIAGNOSIS — G540 Brachial plexus disorders: Secondary | ICD-10-CM | POA: Diagnosis not present

## 2018-05-02 DIAGNOSIS — M5124 Other intervertebral disc displacement, thoracic region: Secondary | ICD-10-CM | POA: Diagnosis not present

## 2018-05-02 DIAGNOSIS — R0789 Other chest pain: Secondary | ICD-10-CM | POA: Diagnosis not present

## 2018-05-04 DIAGNOSIS — G588 Other specified mononeuropathies: Secondary | ICD-10-CM | POA: Diagnosis not present

## 2018-05-31 DIAGNOSIS — R0789 Other chest pain: Secondary | ICD-10-CM | POA: Diagnosis not present

## 2018-05-31 DIAGNOSIS — G540 Brachial plexus disorders: Secondary | ICD-10-CM | POA: Diagnosis not present

## 2018-05-31 DIAGNOSIS — M792 Neuralgia and neuritis, unspecified: Secondary | ICD-10-CM | POA: Diagnosis not present

## 2018-05-31 DIAGNOSIS — G588 Other specified mononeuropathies: Secondary | ICD-10-CM | POA: Diagnosis not present

## 2018-06-06 DIAGNOSIS — G540 Brachial plexus disorders: Secondary | ICD-10-CM | POA: Diagnosis not present

## 2018-06-06 DIAGNOSIS — G588 Other specified mononeuropathies: Secondary | ICD-10-CM | POA: Diagnosis not present

## 2018-06-06 DIAGNOSIS — Z888 Allergy status to other drugs, medicaments and biological substances status: Secondary | ICD-10-CM | POA: Diagnosis not present

## 2018-06-06 DIAGNOSIS — Z91041 Radiographic dye allergy status: Secondary | ICD-10-CM | POA: Diagnosis not present

## 2018-06-13 DIAGNOSIS — R05 Cough: Secondary | ICD-10-CM | POA: Diagnosis not present

## 2018-06-13 DIAGNOSIS — J301 Allergic rhinitis due to pollen: Secondary | ICD-10-CM | POA: Diagnosis not present

## 2018-06-13 DIAGNOSIS — J45991 Cough variant asthma: Secondary | ICD-10-CM | POA: Diagnosis not present

## 2018-06-22 DIAGNOSIS — J029 Acute pharyngitis, unspecified: Secondary | ICD-10-CM | POA: Diagnosis not present

## 2018-06-29 DIAGNOSIS — R918 Other nonspecific abnormal finding of lung field: Secondary | ICD-10-CM

## 2018-06-29 DIAGNOSIS — J301 Allergic rhinitis due to pollen: Secondary | ICD-10-CM | POA: Diagnosis not present

## 2018-06-29 DIAGNOSIS — R848 Other abnormal findings in specimens from respiratory organs and thorax: Secondary | ICD-10-CM | POA: Diagnosis not present

## 2018-06-29 DIAGNOSIS — Z79899 Other long term (current) drug therapy: Secondary | ICD-10-CM | POA: Diagnosis not present

## 2018-06-29 DIAGNOSIS — R9431 Abnormal electrocardiogram [ECG] [EKG]: Secondary | ICD-10-CM | POA: Diagnosis not present

## 2018-06-29 DIAGNOSIS — Z7951 Long term (current) use of inhaled steroids: Secondary | ICD-10-CM | POA: Diagnosis not present

## 2018-06-29 DIAGNOSIS — J45991 Cough variant asthma: Secondary | ICD-10-CM | POA: Diagnosis not present

## 2018-06-29 DIAGNOSIS — R05 Cough: Secondary | ICD-10-CM | POA: Diagnosis not present

## 2018-07-04 DIAGNOSIS — A3790 Whooping cough, unspecified species without pneumonia: Secondary | ICD-10-CM | POA: Diagnosis not present

## 2018-07-04 DIAGNOSIS — J45991 Cough variant asthma: Secondary | ICD-10-CM | POA: Diagnosis not present

## 2018-07-11 DIAGNOSIS — M62838 Other muscle spasm: Secondary | ICD-10-CM | POA: Diagnosis not present

## 2018-07-11 DIAGNOSIS — R05 Cough: Secondary | ICD-10-CM | POA: Diagnosis not present

## 2018-07-11 DIAGNOSIS — F411 Generalized anxiety disorder: Secondary | ICD-10-CM | POA: Diagnosis not present

## 2018-08-18 DIAGNOSIS — R05 Cough: Secondary | ICD-10-CM | POA: Diagnosis not present

## 2018-08-18 DIAGNOSIS — M797 Fibromyalgia: Secondary | ICD-10-CM | POA: Diagnosis not present

## 2018-08-18 DIAGNOSIS — K900 Celiac disease: Secondary | ICD-10-CM | POA: Diagnosis not present

## 2018-08-18 DIAGNOSIS — E063 Autoimmune thyroiditis: Secondary | ICD-10-CM | POA: Diagnosis not present

## 2018-08-18 DIAGNOSIS — I10 Essential (primary) hypertension: Secondary | ICD-10-CM | POA: Diagnosis not present

## 2018-08-18 DIAGNOSIS — Z79899 Other long term (current) drug therapy: Secondary | ICD-10-CM | POA: Diagnosis not present

## 2018-08-18 DIAGNOSIS — J45909 Unspecified asthma, uncomplicated: Secondary | ICD-10-CM | POA: Diagnosis not present

## 2018-08-18 DIAGNOSIS — J4531 Mild persistent asthma with (acute) exacerbation: Secondary | ICD-10-CM | POA: Diagnosis not present

## 2018-08-22 DIAGNOSIS — R05 Cough: Secondary | ICD-10-CM | POA: Diagnosis not present

## 2018-09-05 DIAGNOSIS — I10 Essential (primary) hypertension: Secondary | ICD-10-CM | POA: Diagnosis not present

## 2018-09-12 DIAGNOSIS — J385 Laryngeal spasm: Secondary | ICD-10-CM | POA: Diagnosis not present

## 2018-09-12 DIAGNOSIS — J387 Other diseases of larynx: Secondary | ICD-10-CM | POA: Diagnosis not present

## 2018-09-12 DIAGNOSIS — J386 Stenosis of larynx: Secondary | ICD-10-CM | POA: Diagnosis not present

## 2018-09-12 DIAGNOSIS — J45909 Unspecified asthma, uncomplicated: Secondary | ICD-10-CM | POA: Diagnosis not present

## 2018-09-12 DIAGNOSIS — J398 Other specified diseases of upper respiratory tract: Secondary | ICD-10-CM | POA: Diagnosis not present

## 2018-09-12 DIAGNOSIS — Z79899 Other long term (current) drug therapy: Secondary | ICD-10-CM | POA: Diagnosis not present

## 2018-09-12 DIAGNOSIS — F329 Major depressive disorder, single episode, unspecified: Secondary | ICD-10-CM | POA: Diagnosis not present

## 2018-09-12 DIAGNOSIS — K900 Celiac disease: Secondary | ICD-10-CM | POA: Diagnosis not present

## 2018-09-12 DIAGNOSIS — Z91041 Radiographic dye allergy status: Secondary | ICD-10-CM | POA: Diagnosis not present

## 2018-09-12 DIAGNOSIS — Z888 Allergy status to other drugs, medicaments and biological substances status: Secondary | ICD-10-CM | POA: Diagnosis not present

## 2018-09-12 DIAGNOSIS — E063 Autoimmune thyroiditis: Secondary | ICD-10-CM | POA: Diagnosis not present

## 2018-09-12 DIAGNOSIS — I10 Essential (primary) hypertension: Secondary | ICD-10-CM | POA: Diagnosis not present

## 2018-09-12 DIAGNOSIS — Z7951 Long term (current) use of inhaled steroids: Secondary | ICD-10-CM | POA: Diagnosis not present

## 2018-09-12 DIAGNOSIS — R490 Dysphonia: Secondary | ICD-10-CM | POA: Diagnosis not present

## 2018-09-12 DIAGNOSIS — R05 Cough: Secondary | ICD-10-CM | POA: Diagnosis not present

## 2018-10-10 DIAGNOSIS — I10 Essential (primary) hypertension: Secondary | ICD-10-CM | POA: Diagnosis not present

## 2018-11-22 DIAGNOSIS — R0789 Other chest pain: Secondary | ICD-10-CM | POA: Diagnosis not present

## 2018-11-22 DIAGNOSIS — R05 Cough: Secondary | ICD-10-CM | POA: Diagnosis not present

## 2018-11-22 DIAGNOSIS — R000 Tachycardia, unspecified: Secondary | ICD-10-CM | POA: Diagnosis not present

## 2018-11-22 DIAGNOSIS — R0781 Pleurodynia: Secondary | ICD-10-CM | POA: Diagnosis not present

## 2018-12-08 DIAGNOSIS — R05 Cough: Secondary | ICD-10-CM | POA: Diagnosis not present

## 2018-12-27 DIAGNOSIS — R05 Cough: Secondary | ICD-10-CM | POA: Diagnosis not present

## 2019-01-10 DIAGNOSIS — I10 Essential (primary) hypertension: Secondary | ICD-10-CM | POA: Diagnosis not present

## 2019-01-10 DIAGNOSIS — F419 Anxiety disorder, unspecified: Secondary | ICD-10-CM | POA: Diagnosis not present

## 2019-01-10 DIAGNOSIS — E039 Hypothyroidism, unspecified: Secondary | ICD-10-CM | POA: Diagnosis not present

## 2019-01-12 DIAGNOSIS — R05 Cough: Secondary | ICD-10-CM | POA: Diagnosis not present

## 2019-01-24 DIAGNOSIS — G540 Brachial plexus disorders: Secondary | ICD-10-CM | POA: Diagnosis not present

## 2019-01-24 DIAGNOSIS — R0789 Other chest pain: Secondary | ICD-10-CM | POA: Diagnosis not present

## 2019-01-24 DIAGNOSIS — G588 Other specified mononeuropathies: Secondary | ICD-10-CM | POA: Diagnosis not present

## 2019-02-13 DIAGNOSIS — G588 Other specified mononeuropathies: Secondary | ICD-10-CM | POA: Diagnosis not present

## 2019-02-24 DIAGNOSIS — G588 Other specified mononeuropathies: Secondary | ICD-10-CM | POA: Diagnosis not present

## 2019-03-08 DIAGNOSIS — R490 Dysphonia: Secondary | ICD-10-CM | POA: Insufficient documentation

## 2019-03-08 DIAGNOSIS — J385 Laryngeal spasm: Secondary | ICD-10-CM

## 2019-03-08 DIAGNOSIS — R05 Cough: Secondary | ICD-10-CM | POA: Diagnosis not present

## 2019-03-08 DIAGNOSIS — Z8709 Personal history of other diseases of the respiratory system: Secondary | ICD-10-CM

## 2019-03-08 DIAGNOSIS — R053 Chronic cough: Secondary | ICD-10-CM

## 2019-03-08 DIAGNOSIS — J383 Other diseases of vocal cords: Secondary | ICD-10-CM

## 2019-03-08 HISTORY — DX: Personal history of other diseases of the respiratory system: Z87.09

## 2019-03-08 HISTORY — DX: Chronic cough: R05.3

## 2019-03-08 HISTORY — DX: Dysphonia: R49.0

## 2019-03-08 HISTORY — DX: Other diseases of vocal cords: J38.3

## 2019-03-08 HISTORY — DX: Laryngeal spasm: J38.5

## 2019-04-04 DIAGNOSIS — M792 Neuralgia and neuritis, unspecified: Secondary | ICD-10-CM | POA: Diagnosis not present

## 2019-04-04 DIAGNOSIS — G588 Other specified mononeuropathies: Secondary | ICD-10-CM | POA: Diagnosis not present

## 2019-04-04 DIAGNOSIS — G540 Brachial plexus disorders: Secondary | ICD-10-CM | POA: Diagnosis not present

## 2019-04-04 DIAGNOSIS — R0789 Other chest pain: Secondary | ICD-10-CM | POA: Diagnosis not present

## 2019-04-20 DIAGNOSIS — E039 Hypothyroidism, unspecified: Secondary | ICD-10-CM | POA: Diagnosis not present

## 2019-04-20 DIAGNOSIS — Z23 Encounter for immunization: Secondary | ICD-10-CM | POA: Diagnosis not present

## 2019-04-20 DIAGNOSIS — F330 Major depressive disorder, recurrent, mild: Secondary | ICD-10-CM | POA: Diagnosis not present

## 2019-04-20 DIAGNOSIS — F411 Generalized anxiety disorder: Secondary | ICD-10-CM | POA: Diagnosis not present

## 2019-04-20 DIAGNOSIS — Z0000 Encounter for general adult medical examination without abnormal findings: Secondary | ICD-10-CM | POA: Diagnosis not present

## 2019-04-20 DIAGNOSIS — M797 Fibromyalgia: Secondary | ICD-10-CM | POA: Diagnosis not present

## 2019-04-20 DIAGNOSIS — Z1389 Encounter for screening for other disorder: Secondary | ICD-10-CM | POA: Diagnosis not present

## 2019-04-20 DIAGNOSIS — J4540 Moderate persistent asthma, uncomplicated: Secondary | ICD-10-CM | POA: Diagnosis not present

## 2019-04-20 DIAGNOSIS — I10 Essential (primary) hypertension: Secondary | ICD-10-CM | POA: Diagnosis not present

## 2019-04-20 DIAGNOSIS — E7800 Pure hypercholesterolemia, unspecified: Secondary | ICD-10-CM | POA: Diagnosis not present

## 2019-05-11 DIAGNOSIS — Z8719 Personal history of other diseases of the digestive system: Secondary | ICD-10-CM | POA: Diagnosis not present

## 2019-05-11 DIAGNOSIS — Z8709 Personal history of other diseases of the respiratory system: Secondary | ICD-10-CM | POA: Diagnosis not present

## 2019-05-11 DIAGNOSIS — J383 Other diseases of vocal cords: Secondary | ICD-10-CM | POA: Diagnosis not present

## 2019-05-11 DIAGNOSIS — R490 Dysphonia: Secondary | ICD-10-CM | POA: Diagnosis not present

## 2019-05-11 DIAGNOSIS — J385 Laryngeal spasm: Secondary | ICD-10-CM | POA: Diagnosis not present

## 2019-05-11 DIAGNOSIS — R05 Cough: Secondary | ICD-10-CM | POA: Diagnosis not present

## 2019-05-11 DIAGNOSIS — J387 Other diseases of larynx: Secondary | ICD-10-CM | POA: Diagnosis not present

## 2019-05-23 DIAGNOSIS — Z79899 Other long term (current) drug therapy: Secondary | ICD-10-CM | POA: Diagnosis not present

## 2019-05-23 DIAGNOSIS — Z8709 Personal history of other diseases of the respiratory system: Secondary | ICD-10-CM | POA: Diagnosis not present

## 2019-05-23 DIAGNOSIS — J385 Laryngeal spasm: Secondary | ICD-10-CM | POA: Diagnosis not present

## 2019-05-23 DIAGNOSIS — R05 Cough: Secondary | ICD-10-CM | POA: Diagnosis not present

## 2019-05-23 DIAGNOSIS — J383 Other diseases of vocal cords: Secondary | ICD-10-CM | POA: Diagnosis not present

## 2019-05-23 DIAGNOSIS — R490 Dysphonia: Secondary | ICD-10-CM | POA: Diagnosis not present

## 2019-06-14 DIAGNOSIS — R490 Dysphonia: Secondary | ICD-10-CM | POA: Diagnosis not present

## 2019-06-14 DIAGNOSIS — J387 Other diseases of larynx: Secondary | ICD-10-CM | POA: Diagnosis not present

## 2019-06-14 DIAGNOSIS — J383 Other diseases of vocal cords: Secondary | ICD-10-CM | POA: Diagnosis not present

## 2019-06-14 DIAGNOSIS — R05 Cough: Secondary | ICD-10-CM | POA: Diagnosis not present

## 2019-06-14 DIAGNOSIS — J385 Laryngeal spasm: Secondary | ICD-10-CM | POA: Diagnosis not present

## 2019-07-25 DIAGNOSIS — J45909 Unspecified asthma, uncomplicated: Secondary | ICD-10-CM | POA: Diagnosis not present

## 2019-07-25 DIAGNOSIS — E039 Hypothyroidism, unspecified: Secondary | ICD-10-CM | POA: Diagnosis not present

## 2019-07-25 DIAGNOSIS — F419 Anxiety disorder, unspecified: Secondary | ICD-10-CM | POA: Diagnosis not present

## 2019-07-25 DIAGNOSIS — Z79899 Other long term (current) drug therapy: Secondary | ICD-10-CM | POA: Diagnosis not present

## 2019-07-25 DIAGNOSIS — F329 Major depressive disorder, single episode, unspecified: Secondary | ICD-10-CM | POA: Diagnosis not present

## 2019-07-25 DIAGNOSIS — Z5181 Encounter for therapeutic drug level monitoring: Secondary | ICD-10-CM | POA: Diagnosis not present

## 2019-10-23 DIAGNOSIS — R05 Cough: Secondary | ICD-10-CM | POA: Diagnosis not present

## 2019-10-23 DIAGNOSIS — I10 Essential (primary) hypertension: Secondary | ICD-10-CM | POA: Diagnosis not present

## 2019-10-23 DIAGNOSIS — E039 Hypothyroidism, unspecified: Secondary | ICD-10-CM | POA: Diagnosis not present

## 2020-01-23 DIAGNOSIS — I10 Essential (primary) hypertension: Secondary | ICD-10-CM | POA: Diagnosis not present

## 2020-01-23 DIAGNOSIS — E039 Hypothyroidism, unspecified: Secondary | ICD-10-CM | POA: Diagnosis not present

## 2020-01-23 DIAGNOSIS — R05 Cough: Secondary | ICD-10-CM | POA: Diagnosis not present

## 2020-03-29 DIAGNOSIS — H8110 Benign paroxysmal vertigo, unspecified ear: Secondary | ICD-10-CM | POA: Diagnosis not present

## 2020-04-01 DIAGNOSIS — R2681 Unsteadiness on feet: Secondary | ICD-10-CM | POA: Diagnosis not present

## 2020-04-01 DIAGNOSIS — R42 Dizziness and giddiness: Secondary | ICD-10-CM | POA: Diagnosis not present

## 2020-04-01 DIAGNOSIS — H8110 Benign paroxysmal vertigo, unspecified ear: Secondary | ICD-10-CM | POA: Diagnosis not present

## 2020-04-10 DIAGNOSIS — R42 Dizziness and giddiness: Secondary | ICD-10-CM | POA: Diagnosis not present

## 2020-04-10 DIAGNOSIS — H8110 Benign paroxysmal vertigo, unspecified ear: Secondary | ICD-10-CM | POA: Diagnosis not present

## 2020-04-10 DIAGNOSIS — R2681 Unsteadiness on feet: Secondary | ICD-10-CM | POA: Diagnosis not present

## 2020-04-16 DIAGNOSIS — H8110 Benign paroxysmal vertigo, unspecified ear: Secondary | ICD-10-CM | POA: Diagnosis not present

## 2020-04-16 DIAGNOSIS — R42 Dizziness and giddiness: Secondary | ICD-10-CM | POA: Diagnosis not present

## 2020-04-16 DIAGNOSIS — R2681 Unsteadiness on feet: Secondary | ICD-10-CM | POA: Diagnosis not present

## 2020-04-26 DIAGNOSIS — R519 Headache, unspecified: Secondary | ICD-10-CM | POA: Diagnosis not present

## 2020-04-26 DIAGNOSIS — H532 Diplopia: Secondary | ICD-10-CM | POA: Diagnosis not present

## 2020-04-26 DIAGNOSIS — R42 Dizziness and giddiness: Secondary | ICD-10-CM | POA: Diagnosis not present

## 2020-05-02 DIAGNOSIS — R42 Dizziness and giddiness: Secondary | ICD-10-CM | POA: Diagnosis not present

## 2020-05-02 DIAGNOSIS — H532 Diplopia: Secondary | ICD-10-CM | POA: Diagnosis not present

## 2020-05-02 DIAGNOSIS — R519 Headache, unspecified: Secondary | ICD-10-CM | POA: Diagnosis not present

## 2020-05-16 DIAGNOSIS — R42 Dizziness and giddiness: Secondary | ICD-10-CM | POA: Diagnosis not present

## 2020-05-16 DIAGNOSIS — H903 Sensorineural hearing loss, bilateral: Secondary | ICD-10-CM | POA: Diagnosis not present

## 2020-05-16 DIAGNOSIS — H9192 Unspecified hearing loss, left ear: Secondary | ICD-10-CM | POA: Diagnosis not present

## 2020-05-16 DIAGNOSIS — H8102 Meniere's disease, left ear: Secondary | ICD-10-CM | POA: Diagnosis not present

## 2020-05-16 DIAGNOSIS — H9312 Tinnitus, left ear: Secondary | ICD-10-CM | POA: Diagnosis not present

## 2020-06-12 ENCOUNTER — Other Ambulatory Visit: Payer: Self-pay | Admitting: *Deleted

## 2020-06-12 ENCOUNTER — Encounter: Payer: Self-pay | Admitting: *Deleted

## 2020-06-14 ENCOUNTER — Encounter: Payer: Self-pay | Admitting: Diagnostic Neuroimaging

## 2020-06-14 ENCOUNTER — Ambulatory Visit: Payer: PPO | Admitting: Diagnostic Neuroimaging

## 2020-06-14 VITALS — BP 125/82 | HR 82 | Ht 64.0 in | Wt 218.0 lb

## 2020-06-14 DIAGNOSIS — R42 Dizziness and giddiness: Secondary | ICD-10-CM | POA: Diagnosis not present

## 2020-06-14 NOTE — Progress Notes (Signed)
[facility]  PATIENT: Morgan [name] DOB: 1960/11/01  REFERRING CLINICIAN: Townsend Roger, MD HISTORY FROM: patient  REASON FOR VISIT: new consult    HISTORICAL  CHIEF COMPLAINT:  Chief Complaint  Patient presents with  . New Patient (Initial Visit)    pt alone, rm 6/ she was having some dizzy spells. she worked on doing Restaurant manager, fast food and that didnt help. she completed vestibular therapies and the PT recommended f/u with a neurology. she has complains of dbl vision at times. she had MRI completed, brought report. she doesn't feel self driving.     HISTORY OF PRESENT ILLNESS:   59 year old female here for evaluation of vertigo and dizziness.  History of Hashimoto's thyroiditis, celiac disease, dermatitis herpetiformis, fibromyalgia, thoracic outlet syndrome, hypertension, hypercholesterolemia, migraine, depression, anxiety.  Patient has remote history of benign positional vertigo many years ago.  She was treated with vestibular therapy and Epley maneuvers which helped.  In September 2021 patient received third dose of Covid vaccine.  Following this a few days later she noticed increasing dizzy spells, spinning sensation, boat rocking sensation.  Patient went to PCP, vestibular therapy, ENT, had MRI of the brain which were unremarkable.  Patient has remote history of migraine headaches more than 20 years ago.  [facility].  Migraines have essentially resolved.    REVIEW OF SYSTEMS: Full 14 system review of systems performed and negative with exception of: As per HPI.  ALLERGIES: Allergies  Allergen Reactions  . Iodine Other (See Comments)    REACTION: SOB with injectable iodine  . Iohexol Shortness Of Breath  . Gabapentin   . Pregabalin Other (See Comments)    Altered mental status    HOME MEDICATIONS: Outpatient Medications Prior to Visit  Medication Sig Dispense Refill  . albuterol (PROVENTIL HFA;VENTOLIN HFA) 108  (90 BASE) MCG/ACT inhaler Inhale 1-2 puffs into the lungs every 6 (six) hours as needed for wheezing or shortness of breath.     Marland Kitchen amitriptyline (ELAVIL) 25 MG tablet     . amLODipine (NORVASC) 10 MG tablet Take 10 mg by mouth daily.    . Azelastine HCl 137 MCG/SPRAY SOLN Place into the nose.    . budesonide-formoterol (SYMBICORT) 160-4.5 MCG/ACT inhaler Inhale 2 puffs into the lungs at bedtime.    Marland Kitchen buPROPion (WELLBUTRIN XL) 300 MG 24 hr tablet Take 300 mg by mouth daily.    . carisoprodol (SOMA) 350 MG tablet Take 350 mg by mouth 3 (three) times daily.    . clobetasol ointment (TEMOVATE) 3.61 % Apply 1 application topically 2 (two) times daily as needed.    . clonazePAM (KLONOPIN) 0.5 MG tablet Take 0.5 mg by mouth 2 (two) times daily.    . Cyanocobalamin (B-12) 3000 MCG CAPS Take by mouth.    . dapsone 25 MG tablet Take 25-100 mg by mouth daily as needed (for dermatitis).     Marland Kitchen diphenhydrAMINE (BENADRYL) 50 MG capsule Take 50 mg by mouth 1 hour prior to your procedure. 1 capsule 0  . DULoxetine (CYMBALTA) 60 MG capsule Take 60 mg by mouth daily. Take with 20 mg to equal 80 mg daily.    . fluticasone (FLONASE) 50 MCG/ACT nasal spray Place 1 spray into both nostrils daily.    . hyoscyamine (LEVSIN SL) 0.125 MG SL tablet Place 0.125 mg under the tongue every 4 (four) hours as needed for cramping.    Marland Kitchen ibuprofen (ADVIL,MOTRIN) 200 MG tablet Take 800 mg by  mouth 2 (two) times daily as needed for headache or moderate pain.    . Levothyroxine Sodium 100 MCG CAPS Take 100 mcg by mouth daily.     Marland Kitchen loratadine (CLARITIN) 10 MG tablet Take 10 mg by mouth daily.      . meclizine (ANTIVERT) 25 MG tablet     . metoprolol tartrate (LOPRESSOR) 25 MG tablet Take 25 mg by mouth 2 (two) times daily.    . rosuvastatin (CRESTOR) 5 MG tablet Take 5 mg by mouth daily.    . valACYclovir (VALTREX) 500 MG tablet Take 500-1,000 mg by mouth daily as needed (for outbreaks).    . ALPRAZolam (XANAX) 0.5 MG tablet Take 0.5  mg by mouth 3 (three) times daily as needed for anxiety.    . cloNIDine (CATAPRES) 0.1 MG tablet Take 0.2 mg by mouth 2 (two) times daily.     . DULoxetine (CYMBALTA) 20 MG capsule Take 20 mg by mouth daily. Take with 60 mg to equal 80 mg daily.    . Fluticasone-Salmeterol (ADVAIR) 100-50 MCG/DOSE AEPB Inhale 1 puff into the lungs daily.    Marland Kitchen HYDROcodone-acetaminophen (NORCO/VICODIN) 5-325 MG per tablet Take 1 tablet by mouth every 4 (four) hours as needed for moderate pain.     . metoprolol succinate (TOPROL-XL) 50 MG 24 hr tablet Take 1 tablet (50 mg total) by mouth daily. Take with or immediately following meal. follow-up appt required before future refills are given 30 tablet 0  . Naphazoline-Pheniramine (OPCON-A) 0.027-0.315 % SOLN Place 1-2 drops into both eyes daily as needed (for allergies).    Marland Kitchen oxymorphone (OPANA ER) 30 MG 12 hr tablet Take 30 mg by mouth every 12 (twelve) hours.    . predniSONE (DELTASONE) 50 MG tablet One tablet (50mg ) 13 hours prior to procedure; one tablet (50mg ) 7 hours prior to procedure and then one tablet (50 mg) one hour prior to procedure. 3 tablet 0   No facility-administered medications prior to visit.    PAST MEDICAL HISTORY: Past Medical History:  Diagnosis Date  . Allergy    seasonal  . Anemia   . Anxiety   . Arthritis   . Asthma   . Celiac disease   . Depression   . Fibula fracture   . GERD (gastroesophageal reflux disease)   . Headache   . HLD (hyperlipidemia)   . Hx of migraines   . Hypertension   . Hypothyroidism   . Lupus ([place])   . Myalgia and myositis, unspecified   . Other B-complex deficiencies   . Recurrent HSV (herpes simplex virus)   . Thyroiditis   . Unspecified hemorrhoids without mention of complication   . Varicose veins of lower extremities with other complications   . Vertigo     PAST SURGICAL HISTORY: Past Surgical History:  Procedure Laterality Date  . ABDOMINAL HYSTERECTOMY    . COLONOSCOPY  11/00   biopsy  negative  . ESOPHAGOGASTRODUODENOSCOPY  1/04   normal  . FIBULA FRACTURE SURGERY    . MANDIBLE SURGERY    . NASAL SINUS SURGERY    . OTHER SURGICAL HISTORY     laser surgery for endometriosis  . TOTAL VAGINAL HYSTERECTOMY  10/98  . VENOGRAM N/A 03/06/2014   Procedure: VENOGRAM;  Surgeon: Conrad Worton, MD;  Location: [facility] C[facility] LAB;  Service: Cardiovascular;  Laterality: N/A;    FAMILY HISTORY: Family History  Problem Relation Age of Onset  . Colon polyps Father   . Hypertension Father   .  Asthma Mother   . Hypertension Mother   . Irritable bowel syndrome Mother   . Cancer Mother   . Varicose Veins Mother   . Colon cancer Maternal Grandmother   . Uterine cancer Other   . Kidney disease Other     SOCIAL HISTORY: Social History   Socioeconomic History  . Marital status: Legally Separated    Spouse name: Not on file  . Number of children: 0  . Years of education: Not on file  . Highest education level: Not on file  Occupational History  . Occupation: Environmental health practitioner: [employer]  Tobacco Use  . Smoking status: Never Smoker  . Smokeless tobacco: Never Used  Substance and Sexual Activity  . Alcohol use: Yes    Comment: Very rare  . Drug use: No  . Sexual activity: Not on file  Other Topics Concern  . 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 Difficulty of Paying Living Expenses: Not on file  Food Insecurity:   . Worried About Charity fundraiser in the Last Year: Not on file  . Ran Out of Food in the Last Year: Not on file  Transportation Needs:   . Lack of Transportation (Medical): Not on file  . Lack of Transportation (Non-Medical): Not on file  Physical Activity:   . Days of Exercise per Week: Not on file  . Minutes of Exercise per Session: Not on file  Stress:   . Feeling of Stress :  Not on file  Social Connections:   . Frequency of Communication with Friends and Family: Not on file  . Frequency of Social Gatherings with Friends and Family: Not on file  . Attends Religious Services: Not on file  . Active Member of Clubs or Organizations: Not on file  . Attends Archivist Meetings: Not on file  . Marital Status: Not on file  Intimate Partner Violence:   . Fear of Current or Ex-Partner: Not on file  . Emotionally Abused: Not on file  . Physically Abused: Not on file  . Sexually Abused: Not on file     PHYSICAL EXAM  GENERAL EXAM/CONSTITUTIONAL: Vitals:  Vitals:   06/14/20 1137 06/14/20 1138 06/14/20 1139  BP: (!) 131/94 130/86 125/82  Pulse: 74 78 82  Weight: 218 lb (98.9 kg)    Height: 5\' 4"  (1.626 m)       Body mass index is 37.42 kg/m. Wt Readings from Last 3 Encounters:  06/14/20 218 lb (98.9 kg)  03/08/14 200 lb (90.7 kg)  03/06/14 200 lb (90.7 kg)     Patient is in no distress; well developed, nourished and groomed; neck is supple  CARDIOVASCULAR:  Examination of carotid arteries is normal; no carotid bruits  Regular rate and rhythm, no murmurs  Examination of peripheral vascular system by observation and palpation is normal  EYES:  Ophthalmoscopic exam of optic discs and posterior segments is normal; no papilledema or hemorrhages  No exam data present  MUSCULOSKELETAL:  Gait, strength, tone, movements noted in Neurologic exam below  NEUROLOGIC: MENTAL STATUS:  No flowsheet data found.  awake, alert, oriented to person, place and time  recent and remote memory intact  normal attention and concentration  language  fluent, comprehension intact, naming intact  fund of knowledge appropriate  CRANIAL NERVE:   2nd - no papilledema on fundoscopic exam  2nd, 3rd, 4th, 6th - pupils equal and reactive to light, visual fields full to confrontation, extraocular muscles intact, no nystagmus  5th - facial sensation  symmetric  7th - facial strength symmetric  8th - hearing intact  9th - palate elevates symmetrically, uvula midline  11th - shoulder shrug symmetric  12th - tongue protrusion midline  MOTOR:   normal bulk and tone, full strength in the BUE, BLE  SENSORY:   normal and symmetric to light touch, temperature, vibration  COORDINATION:   finger-nose-finger, fine finger movements normal  REFLEXES:   deep tendon reflexes TRACE and symmetric  GAIT/STATION:   narrow based gait     DIAGNOSTIC DATA (LABS, IMAGING, TESTING) - I reviewed patient records, labs, notes, testing and imaging myself where available.  Lab Results  Component Value Date   WBC 8.9 08/04/2011   HGB 14.3 03/06/2014   HCT 42.0 03/06/2014   MCV 91.3 08/04/2011   PLT 316.0 08/04/2011      Component Value Date/Time   NA 140 03/06/2014 0653   K 4.8 03/06/2014 0653   CL 104 03/06/2014 0653   CO2 27 08/04/2011 0500   GLUCOSE 142 (H) 03/06/2014 0653   BUN 9 03/06/2014 0653   CREATININE 0.70 03/06/2014 0653   CALCIUM 9.2 08/04/2011 0500   PROT 7.6 08/04/2011 0500   ALBUMIN 4.0 08/04/2011 0500   AST 21 08/04/2011 0500   ALT 17 08/04/2011 0500   ALKPHOS 81 08/04/2011 0500   BILITOT 0.8 08/04/2011 0500   GFRNONAA 98.24 07/11/2010 0913   GFRAA 77 09/17/2008 1655   Lab Results  Component Value Date   CHOL 174 08/04/2011   HDL 39.00 (L) 08/04/2011   LDLCALC 124 (H) 08/04/2011   TRIG 54.0 08/04/2011   CHOLHDL 4 08/04/2011   No results found for: HGBA1C Lab Results  Component Value Date   VITAMINB12 801 08/04/2011   Lab Results  Component Value Date   TSH 0.07 (L) 07/11/2010    05/01/20 MRI brain - normal    ASSESSMENT AND PLAN  59 y.o. year old female here with:   Dx:  1. Dizziness      PLAN:   VERTIGO, DIZZINESS, HEADACHES (vestibular migraine vs BPPV) - monitor symptoms; should improve over time  MORNING HEADACHES, FATIGUE - consider sleep study  Return for return  to PCP.    Penni Bombard, MD 69/12/7891, 81:01 PM Certified in Neurology, Neurophysiology and Neuroimaging  [facility] [address], [place], [place] [address] [phone]

## 2020-06-14 NOTE — Patient Instructions (Signed)
  VERTIGO, DIZZINESS, HEADACHES (vestibular migraine vs BPPV) - monitor symptoms; should improve over time  MORNING HEADACHES, FATIGUE - consider sleep study

## 2020-06-21 DIAGNOSIS — E039 Hypothyroidism, unspecified: Secondary | ICD-10-CM | POA: Diagnosis not present

## 2020-06-21 DIAGNOSIS — R42 Dizziness and giddiness: Secondary | ICD-10-CM | POA: Diagnosis not present

## 2020-06-24 DIAGNOSIS — H43393 Other vitreous opacities, bilateral: Secondary | ICD-10-CM | POA: Insufficient documentation

## 2020-06-24 DIAGNOSIS — H04123 Dry eye syndrome of bilateral lacrimal glands: Secondary | ICD-10-CM | POA: Insufficient documentation

## 2020-06-24 DIAGNOSIS — H524 Presbyopia: Secondary | ICD-10-CM | POA: Diagnosis not present

## 2020-06-24 DIAGNOSIS — H8113 Benign paroxysmal vertigo, bilateral: Secondary | ICD-10-CM | POA: Diagnosis not present

## 2020-06-24 DIAGNOSIS — H5203 Hypermetropia, bilateral: Secondary | ICD-10-CM | POA: Diagnosis not present

## 2020-06-24 DIAGNOSIS — H52203 Unspecified astigmatism, bilateral: Secondary | ICD-10-CM | POA: Diagnosis not present

## 2020-06-24 HISTORY — DX: Dry eye syndrome of bilateral lacrimal glands: H04.123

## 2020-06-24 HISTORY — DX: Other vitreous opacities, bilateral: H43.393

## 2020-06-27 DIAGNOSIS — Z1231 Encounter for screening mammogram for malignant neoplasm of breast: Secondary | ICD-10-CM | POA: Diagnosis not present

## 2020-08-28 DIAGNOSIS — G43809 Other migraine, not intractable, without status migrainosus: Secondary | ICD-10-CM | POA: Diagnosis not present

## 2020-08-28 DIAGNOSIS — G43109 Migraine with aura, not intractable, without status migrainosus: Secondary | ICD-10-CM | POA: Diagnosis not present

## 2020-10-30 DIAGNOSIS — F411 Generalized anxiety disorder: Secondary | ICD-10-CM | POA: Diagnosis not present

## 2020-10-30 DIAGNOSIS — M797 Fibromyalgia: Secondary | ICD-10-CM | POA: Diagnosis not present

## 2020-10-30 DIAGNOSIS — F330 Major depressive disorder, recurrent, mild: Secondary | ICD-10-CM | POA: Diagnosis not present

## 2020-12-19 DIAGNOSIS — R059 Cough, unspecified: Secondary | ICD-10-CM | POA: Diagnosis not present

## 2020-12-19 DIAGNOSIS — J45909 Unspecified asthma, uncomplicated: Secondary | ICD-10-CM | POA: Diagnosis not present

## 2020-12-30 DIAGNOSIS — R059 Cough, unspecified: Secondary | ICD-10-CM | POA: Diagnosis not present

## 2021-01-28 DIAGNOSIS — Z8709 Personal history of other diseases of the respiratory system: Secondary | ICD-10-CM | POA: Diagnosis not present

## 2021-01-28 DIAGNOSIS — R490 Dysphonia: Secondary | ICD-10-CM | POA: Diagnosis not present

## 2021-01-28 DIAGNOSIS — J383 Other diseases of vocal cords: Secondary | ICD-10-CM | POA: Diagnosis not present

## 2021-01-28 DIAGNOSIS — R053 Chronic cough: Secondary | ICD-10-CM | POA: Diagnosis not present

## 2021-01-28 DIAGNOSIS — J385 Laryngeal spasm: Secondary | ICD-10-CM | POA: Diagnosis not present

## 2021-01-28 DIAGNOSIS — Z888 Allergy status to other drugs, medicaments and biological substances status: Secondary | ICD-10-CM | POA: Diagnosis not present

## 2021-02-03 DIAGNOSIS — F411 Generalized anxiety disorder: Secondary | ICD-10-CM | POA: Diagnosis not present

## 2021-02-03 DIAGNOSIS — F330 Major depressive disorder, recurrent, mild: Secondary | ICD-10-CM | POA: Diagnosis not present

## 2021-02-03 DIAGNOSIS — M797 Fibromyalgia: Secondary | ICD-10-CM | POA: Diagnosis not present

## 2021-02-20 DIAGNOSIS — R053 Chronic cough: Secondary | ICD-10-CM | POA: Diagnosis not present

## 2021-04-17 DIAGNOSIS — F331 Major depressive disorder, recurrent, moderate: Secondary | ICD-10-CM | POA: Diagnosis not present

## 2021-04-17 DIAGNOSIS — Z0000 Encounter for general adult medical examination without abnormal findings: Secondary | ICD-10-CM | POA: Diagnosis not present

## 2021-04-17 DIAGNOSIS — Z23 Encounter for immunization: Secondary | ICD-10-CM | POA: Diagnosis not present

## 2021-04-17 DIAGNOSIS — E7800 Pure hypercholesterolemia, unspecified: Secondary | ICD-10-CM | POA: Diagnosis not present

## 2021-04-17 DIAGNOSIS — Z1331 Encounter for screening for depression: Secondary | ICD-10-CM | POA: Diagnosis not present

## 2021-04-17 DIAGNOSIS — F411 Generalized anxiety disorder: Secondary | ICD-10-CM | POA: Diagnosis not present

## 2021-04-17 DIAGNOSIS — R519 Headache, unspecified: Secondary | ICD-10-CM | POA: Diagnosis not present

## 2021-04-17 DIAGNOSIS — M8580 Other specified disorders of bone density and structure, unspecified site: Secondary | ICD-10-CM | POA: Diagnosis not present

## 2021-04-17 DIAGNOSIS — Z6836 Body mass index (BMI) 36.0-36.9, adult: Secondary | ICD-10-CM | POA: Diagnosis not present

## 2021-04-17 DIAGNOSIS — E039 Hypothyroidism, unspecified: Secondary | ICD-10-CM | POA: Diagnosis not present

## 2021-04-17 DIAGNOSIS — I10 Essential (primary) hypertension: Secondary | ICD-10-CM | POA: Diagnosis not present

## 2021-04-25 DIAGNOSIS — R519 Headache, unspecified: Secondary | ICD-10-CM | POA: Diagnosis not present

## 2021-04-25 DIAGNOSIS — J342 Deviated nasal septum: Secondary | ICD-10-CM | POA: Diagnosis not present

## 2021-04-25 DIAGNOSIS — H5712 Ocular pain, left eye: Secondary | ICD-10-CM | POA: Diagnosis not present

## 2021-04-25 DIAGNOSIS — Z8669 Personal history of other diseases of the nervous system and sense organs: Secondary | ICD-10-CM | POA: Diagnosis not present

## 2021-04-25 DIAGNOSIS — R0981 Nasal congestion: Secondary | ICD-10-CM | POA: Diagnosis not present

## 2021-04-29 DIAGNOSIS — H04123 Dry eye syndrome of bilateral lacrimal glands: Secondary | ICD-10-CM | POA: Diagnosis not present

## 2021-04-29 DIAGNOSIS — G8929 Other chronic pain: Secondary | ICD-10-CM | POA: Diagnosis not present

## 2021-04-29 DIAGNOSIS — R519 Headache, unspecified: Secondary | ICD-10-CM | POA: Diagnosis not present

## 2021-04-29 DIAGNOSIS — H43393 Other vitreous opacities, bilateral: Secondary | ICD-10-CM | POA: Diagnosis not present

## 2021-05-05 ENCOUNTER — Other Ambulatory Visit: Payer: Self-pay | Admitting: *Deleted

## 2021-05-05 ENCOUNTER — Encounter: Payer: Self-pay | Admitting: *Deleted

## 2021-05-07 ENCOUNTER — Ambulatory Visit: Payer: PPO | Admitting: Diagnostic Neuroimaging

## 2021-05-07 ENCOUNTER — Telehealth: Payer: Self-pay | Admitting: Diagnostic Neuroimaging

## 2021-05-07 ENCOUNTER — Encounter: Payer: Self-pay | Admitting: Diagnostic Neuroimaging

## 2021-05-07 VITALS — BP 118/78 | HR 72 | Ht 64.0 in | Wt 209.4 lb

## 2021-05-07 DIAGNOSIS — H5712 Ocular pain, left eye: Secondary | ICD-10-CM | POA: Diagnosis not present

## 2021-05-07 NOTE — Telephone Encounter (Signed)
Health team order sent to GI, [facility] they will reach out to the patient to schedule.

## 2021-05-07 NOTE — Progress Notes (Signed)
[facility]  PATIENT: Morgan [name] DOB: Jun 23, 1961  REFERRING CLINICIAN: Townsend Roger, MD HISTORY FROM: patient  REASON FOR VISIT: follow up   HISTORICAL  CHIEF COMPLAINT:  Chief Complaint  Patient presents with   Migraine    Rm 6,  est pt, new problem "I have hx of migraines but this is different; started with my left eye feeling bruised, area hurts to touch it, ENT cleared me, eye dr cleared me; Nurtec helps, sumatriptan injections never did"     HISTORY OF PRESENT ILLNESS:   UPDATE (05/07/21, VRP): Since last visit, patient now on topiramate and Nurtec since spring 2022 with good improvement in migraine and dizziness symptoms.  Now here for evaluation of new onset left eye pain and sensitivity since August 2020.  She is seen ophthalmology and ENT with no specific diagnosis found.  No blurred vision.  Mild sensitivity to light and left side.  PRIOR HPI (06/14/20): 60 year old female here for evaluation of vertigo and dizziness.  History of Hashimoto's thyroiditis, celiac disease, dermatitis herpetiformis, fibromyalgia, thoracic outlet syndrome, hypertension, hypercholesterolemia, migraine, depression, anxiety.  Patient has remote history of benign positional vertigo many years ago.  She was treated with vestibular therapy and Epley maneuvers which helped.  In September 2021 patient received third dose of Covid vaccine.  Following this a few days later she noticed increasing dizzy spells, spinning sensation, boat rocking sensation.  Patient went to PCP, vestibular therapy, ENT, had MRI of the brain which were unremarkable.  Patient has remote history of migraine headaches more than 20 years ago.  She was [facility] to [facility].  Migraines have essentially resolved.    REVIEW OF SYSTEMS: Full 14 system review of systems performed and negative with exception of: As per HPI.  ALLERGIES: Allergies  Allergen Reactions   Iodine Other (See  Comments)    REACTION: SOB with injectable iodine   Iohexol Shortness Of Breath   Gabapentin    Pregabalin Other (See Comments)    Altered mental status    HOME MEDICATIONS: Outpatient Medications Prior to Visit  Medication Sig Dispense Refill   albuterol (PROVENTIL HFA;VENTOLIN HFA) 108 (90 BASE) MCG/ACT inhaler Inhale 1-2 puffs into the lungs every 6 (six) hours as needed for wheezing or shortness of breath.      amitriptyline (ELAVIL) 25 MG tablet      amLODipine (NORVASC) 10 MG tablet Take 10 mg by mouth daily.     budesonide-formoterol (SYMBICORT) 160-4.5 MCG/ACT inhaler Inhale 2 puffs into the lungs at bedtime.     buPROPion (WELLBUTRIN XL) 300 MG 24 hr tablet Take 300 mg by mouth daily.     carisoprodol (SOMA) 350 MG tablet Take 350 mg by mouth 3 (three) times daily.     clobetasol ointment (TEMOVATE) 5.40 % Apply 1 application topically 2 (two) times daily as needed.     clonazePAM (KLONOPIN) 0.5 MG tablet Take 0.5 mg by mouth 2 (two) times daily.     Cyanocobalamin (B-12) 3000 MCG CAPS Take by mouth.     dapsone 25 MG tablet Take 25-100 mg by mouth daily as needed (for dermatitis).      diphenhydrAMINE (BENADRYL) 50 MG capsule Take 50 mg by mouth 1 hour prior to your procedure. 1 capsule 0   DULoxetine (CYMBALTA) 60 MG capsule Take 60 mg by mouth daily. Take with 20 mg to equal 80 mg daily.     fluticasone (FLONASE) 50 MCG/ACT nasal spray Place 1 spray into both  nostrils daily.     hyoscyamine (LEVSIN SL) 0.125 MG SL tablet Place 0.125 mg under the tongue every 4 (four) hours as needed for cramping.     ibuprofen (ADVIL,MOTRIN) 200 MG tablet Take 800 mg by mouth 2 (two) times daily as needed for headache or moderate pain.     Levothyroxine Sodium 100 MCG CAPS Take 100 mcg by mouth daily.      loratadine (CLARITIN) 10 MG tablet Take 10 mg by mouth daily.       meclizine (ANTIVERT) 25 MG tablet      metoprolol tartrate (LOPRESSOR) 25 MG tablet Take 25 mg by mouth 2 (two) times  daily.     Rimegepant Sulfate (NURTEC) 75 MG TBDP Take by mouth as needed.     rosuvastatin (CRESTOR) 5 MG tablet Take 5 mg by mouth daily.     topiramate (TOPAMAX) 25 MG tablet 25 mg at bedtime.     valACYclovir (VALTREX) 500 MG tablet Take 500-1,000 mg by mouth daily as needed (for outbreaks).     Azelastine HCl 137 MCG/SPRAY SOLN Place into the nose.     No facility-administered medications prior to visit.    PAST MEDICAL HISTORY: Past Medical History:  Diagnosis Date   Allergy    seasonal   Anemia    Anxiety    Arthritis    Asthma    Celiac disease    Depression    Fibromyalgia    Fibula fracture    GERD (gastroesophageal reflux disease)    Headache    HLD (hyperlipidemia)    Hx of migraines    Hypertension    Hypothyroidism    Lupus ([facility])    Myalgia and myositis, unspecified    Other B-complex deficiencies    PTSD (post-traumatic stress disorder)    Recurrent HSV (herpes simplex virus)    Thoracic outlet syndrome    Thyroiditis    Unspecified hemorrhoids without mention of complication    Varicose veins of lower extremities with other complications    Vertigo     PAST SURGICAL HISTORY: Past Surgical History:  Procedure Laterality Date   ABDOMINAL HYSTERECTOMY  1998   COLONOSCOPY  05/14/1999   biopsy negative   ESOPHAGOGASTRODUODENOSCOPY  07/13/2002   normal   FIBULA FRACTURE SURGERY     MANDIBLE SURGERY     NASAL SINUS SURGERY     OTHER SURGICAL HISTORY     laser surgery for endometriosis   TOTAL VAGINAL HYSTERECTOMY  04/12/1997   VENOGRAM N/A 03/06/2014   Procedure: VENOGRAM;  Surgeon: Conrad Heard, MD;  Location[facility] CATH LAB;  Service: Cardiovascular;  Laterality: N/A;    FAMILY HISTORY: Family History  Problem Relation Age of Onset   Colon polyps Father    Hypertension Father    Asthma Mother    Hypertension Mother    Irritable bowel syndrome Mother    Cancer Mother    Varicose Veins Mother    Colon cancer Maternal Grandmother    Uterine  cancer Other    Kidney disease Other     SOCIAL HISTORY: Social History   Socioeconomic History   Marital status: Divorced    Spouse name: Not on file   Number of children: 0   Years of education: Not on file   Highest education level: Not on file  Occupational History   Occupation: Environmental consultant    Employer: [employer]  Tobacco Use   Smoking status: Never   Smokeless tobacco: Never  Substance and  Sexual Activity   Alcohol use: Yes    Comment: Very rare   Drug use: No   Sexual activity: Not on file  Other Topics Concern   Not on file  Social History Narrative   Divorced    No children    Living in [place] (12/10)   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 Wt Readings from Last 3 Encounters:  05/07/21 209 lb 6.4 oz (95 kg)  06/14/20 218 lb (98.9 kg)  03/08/14 200 lb (90.7 kg)   Patient is in no distress; well developed, nourished and groomed; neck is supple  CARDIOVASCULAR: Examination of carotid arteries is normal; no carotid bruits Regular rate and rhythm, no murmurs Examination of peripheral vascular system by observation and palpation is normal  EYES: Ophthalmoscopic exam of optic discs and posterior segments is normal; no papilledema or hemorrhages No results found.  MUSCULOSKELETAL: Gait, strength, tone, movements noted in Neurologic exam below  NEUROLOGIC: MENTAL STATUS:  No flowsheet data found. awake, alert, oriented to person, place and time recent and remote memory intact normal  attention and concentration language fluent, comprehension intact, naming intact fund of knowledge appropriate  CRANIAL NERVE:  2nd - no papilledema on fundoscopic exam 2nd, 3rd, 4th, 6th - pupils equal and reactive to light, visual fields full to confrontation, extraocular muscles intact, no nystagmus 5th - facial sensation symmetric 7th - facial strength symmetric 8th - hearing intact 9th - palate elevates symmetrically, uvula midline 11th - shoulder shrug symmetric 12th - tongue protrusion midline  MOTOR:  normal bulk and tone, full strength in the BUE, BLE  SENSORY:  normal and symmetric to light touch, temperature, vibration  COORDINATION:  finger-nose-finger, fine finger movements normal  REFLEXES:  deep tendon reflexes TRACE and symmetric  GAIT/STATION:  narrow based gait     DIAGNOSTIC DATA (LABS, IMAGING, TESTING) - I reviewed patient records, labs, notes, testing and imaging myself where available.  Lab Results  Component Value Date   WBC 8.9 08/04/2011   HGB 14.3 03/06/2014   HCT 42.0 03/06/2014   MCV 91.3 08/04/2011   PLT 316.0 08/04/2011      Component Value Date/Time   NA 140 03/06/2014 0653   K 4.8 03/06/2014 0653   CL 104 03/06/2014 0653   CO2 27 08/04/2011 0500   GLUCOSE 142 (H) 03/06/2014 0653   BUN 9 03/06/2014 0653   CREATININE 0.70 03/06/2014 0653   CALCIUM 9.2 08/04/2011 0500   PROT 7.6 08/04/2011 0500   ALBUMIN 4.0 08/04/2011 0500   AST 21 08/04/2011 0500   ALT 17 08/04/2011 0500   ALKPHOS 81 08/04/2011 0500   BILITOT 0.8 08/04/2011 0500   GFRNONAA 98.24 07/11/2010 0913   GFRAA 77 09/17/2008 1655   Lab Results  Component Value Date   CHOL 174 08/04/2011   HDL 39.00 (L) 08/04/2011   LDLCALC 124 (H) 08/04/2011   TRIG 54.0 08/04/2011   CHOLHDL 4 08/04/2011   No results found  for: [facility] Lab Results  Component Value Date   [id number] 414 08/04/2011   Lab Results  Component Value Date   TSH 0.07 (L) 07/11/2010     05/01/20 MRI brain - normal    ASSESSMENT AND PLAN  60 y.o. year old female here with:   Dx:  No diagnosis found.    PLAN:  LEFT EYE PAIN / SENSITIVITY (likely migraine variant; need to rule out other causes) - check MRI brain , orbits  MIGRAINE WITH AURA - increase topiramate to 50mg  at bedtime - continue nurtec   VERTIGO, DIZZINESS, HEADACHES (vestibular migraine vs BPPV) - monitor symptoms; should improve over time  MORNING HEADACHES, FATIGUE - consider sleep study  Orders Placed This Encounter  Procedures   MR ORBITS W WO CONTRAST   MR BRAIN W WO CONTRAST   Return for pending if symptoms worsen or fail to improve, pending test results.    Penni Bombard, MD 43/60/1658, 0:06 PM Certified in Neurology, Neurophysiology and Neuroimaging  [facility] [address] [place], [place] [address] [phone]

## 2021-05-15 ENCOUNTER — Other Ambulatory Visit: Payer: Self-pay | Admitting: *Deleted

## 2021-05-15 MED ORDER — TOPIRAMATE 50 MG PO TABS: 50.0000 mg | ORAL_TABLET | Freq: Every day | ORAL | 5 refills | Status: DC

## 2021-05-16 DIAGNOSIS — M25562 Pain in left knee: Secondary | ICD-10-CM | POA: Diagnosis not present

## 2021-05-16 DIAGNOSIS — M7989 Other specified soft tissue disorders: Secondary | ICD-10-CM | POA: Diagnosis not present

## 2021-05-16 DIAGNOSIS — T148XXA Other injury of unspecified body region, initial encounter: Secondary | ICD-10-CM | POA: Diagnosis not present

## 2021-05-16 DIAGNOSIS — W109XXD Fall (on) (from) unspecified stairs and steps, subsequent encounter: Secondary | ICD-10-CM | POA: Diagnosis not present

## 2021-05-23 ENCOUNTER — Ambulatory Visit
Admission: RE | Admit: 2021-05-23 | Discharge: 2021-05-23 | Disposition: A | Discharge status: Home / Self Care | Payer: PPO | Source: Ambulatory Visit | Attending: Diagnostic Neuroimaging | Admitting: Diagnostic Neuroimaging

## 2021-05-23 ENCOUNTER — Other Ambulatory Visit: Payer: Self-pay

## 2021-05-23 DIAGNOSIS — H5712 Ocular pain, left eye: Secondary | ICD-10-CM

## 2021-05-23 MED ORDER — GADOBENATE DIMEGLUMINE 529 MG/ML IV SOLN
19.0000 mL | Freq: Once | INTRAVENOUS | Status: AC | PRN
  Administered 2021-05-23: 19 mL via INTRAVENOUS

## 2021-05-28 NOTE — Telephone Encounter (Signed)
Any suggestions? Please advise

## 2021-05-29 NOTE — Telephone Encounter (Signed)
FYI

## 2021-05-29 NOTE — Telephone Encounter (Signed)
Please advise 

## 2021-07-02 DIAGNOSIS — Z20828 Contact with and (suspected) exposure to other viral communicable diseases: Secondary | ICD-10-CM | POA: Diagnosis not present

## 2021-07-02 DIAGNOSIS — J209 Acute bronchitis, unspecified: Secondary | ICD-10-CM | POA: Diagnosis not present

## 2021-07-02 DIAGNOSIS — R062 Wheezing: Secondary | ICD-10-CM | POA: Diagnosis not present

## 2021-07-03 DIAGNOSIS — R059 Cough, unspecified: Secondary | ICD-10-CM | POA: Diagnosis not present

## 2021-07-11 DIAGNOSIS — J45901 Unspecified asthma with (acute) exacerbation: Secondary | ICD-10-CM | POA: Diagnosis not present

## 2021-07-11 DIAGNOSIS — R059 Cough, unspecified: Secondary | ICD-10-CM | POA: Diagnosis not present

## 2021-07-21 DIAGNOSIS — R053 Chronic cough: Secondary | ICD-10-CM | POA: Diagnosis not present

## 2021-07-21 DIAGNOSIS — R7303 Prediabetes: Secondary | ICD-10-CM | POA: Diagnosis not present

## 2021-07-21 DIAGNOSIS — E7800 Pure hypercholesterolemia, unspecified: Secondary | ICD-10-CM | POA: Diagnosis not present

## 2021-10-20 DIAGNOSIS — I10 Essential (primary) hypertension: Secondary | ICD-10-CM | POA: Diagnosis not present

## 2021-10-20 DIAGNOSIS — F411 Generalized anxiety disorder: Secondary | ICD-10-CM | POA: Diagnosis not present

## 2021-10-20 DIAGNOSIS — M797 Fibromyalgia: Secondary | ICD-10-CM | POA: Diagnosis not present

## 2021-10-20 DIAGNOSIS — F330 Major depressive disorder, recurrent, mild: Secondary | ICD-10-CM | POA: Diagnosis not present

## 2021-10-22 DIAGNOSIS — M19012 Primary osteoarthritis, left shoulder: Secondary | ICD-10-CM | POA: Diagnosis not present

## 2021-10-22 DIAGNOSIS — M7542 Impingement syndrome of left shoulder: Secondary | ICD-10-CM

## 2021-10-22 DIAGNOSIS — Z888 Allergy status to other drugs, medicaments and biological substances status: Secondary | ICD-10-CM | POA: Diagnosis not present

## 2021-10-22 DIAGNOSIS — G540 Brachial plexus disorders: Secondary | ICD-10-CM | POA: Diagnosis not present

## 2021-10-22 DIAGNOSIS — Z91041 Radiographic dye allergy status: Secondary | ICD-10-CM | POA: Diagnosis not present

## 2021-10-22 HISTORY — DX: Impingement syndrome of left shoulder: M75.42

## 2021-10-29 DIAGNOSIS — M79602 Pain in left arm: Secondary | ICD-10-CM | POA: Diagnosis not present

## 2021-10-29 DIAGNOSIS — M2560 Stiffness of unspecified joint, not elsewhere classified: Secondary | ICD-10-CM | POA: Diagnosis not present

## 2021-10-29 DIAGNOSIS — M542 Cervicalgia: Secondary | ICD-10-CM | POA: Diagnosis not present

## 2021-10-29 DIAGNOSIS — M7542 Impingement syndrome of left shoulder: Secondary | ICD-10-CM | POA: Diagnosis not present

## 2021-10-29 DIAGNOSIS — M25612 Stiffness of left shoulder, not elsewhere classified: Secondary | ICD-10-CM | POA: Diagnosis not present

## 2021-10-29 DIAGNOSIS — G540 Brachial plexus disorders: Secondary | ICD-10-CM | POA: Diagnosis not present

## 2021-11-07 DIAGNOSIS — R682 Dry mouth, unspecified: Secondary | ICD-10-CM | POA: Diagnosis not present

## 2021-11-07 DIAGNOSIS — H04123 Dry eye syndrome of bilateral lacrimal glands: Secondary | ICD-10-CM | POA: Diagnosis not present

## 2021-11-10 DIAGNOSIS — M2560 Stiffness of unspecified joint, not elsewhere classified: Secondary | ICD-10-CM | POA: Diagnosis not present

## 2021-11-10 DIAGNOSIS — M7542 Impingement syndrome of left shoulder: Secondary | ICD-10-CM | POA: Diagnosis not present

## 2021-11-10 DIAGNOSIS — M25612 Stiffness of left shoulder, not elsewhere classified: Secondary | ICD-10-CM | POA: Diagnosis not present

## 2021-11-10 DIAGNOSIS — M542 Cervicalgia: Secondary | ICD-10-CM | POA: Diagnosis not present

## 2021-11-10 DIAGNOSIS — G540 Brachial plexus disorders: Secondary | ICD-10-CM | POA: Diagnosis not present

## 2021-11-10 DIAGNOSIS — M79602 Pain in left arm: Secondary | ICD-10-CM | POA: Diagnosis not present

## 2021-11-17 DIAGNOSIS — Z1231 Encounter for screening mammogram for malignant neoplasm of breast: Secondary | ICD-10-CM | POA: Diagnosis not present

## 2021-11-19 DIAGNOSIS — M797 Fibromyalgia: Secondary | ICD-10-CM | POA: Diagnosis not present

## 2021-11-19 DIAGNOSIS — R682 Dry mouth, unspecified: Secondary | ICD-10-CM | POA: Diagnosis not present

## 2021-11-19 DIAGNOSIS — H04123 Dry eye syndrome of bilateral lacrimal glands: Secondary | ICD-10-CM | POA: Diagnosis not present

## 2021-12-03 DIAGNOSIS — M7542 Impingement syndrome of left shoulder: Secondary | ICD-10-CM | POA: Diagnosis not present

## 2021-12-03 DIAGNOSIS — G5602 Carpal tunnel syndrome, left upper limb: Secondary | ICD-10-CM | POA: Insufficient documentation

## 2021-12-03 HISTORY — DX: Carpal tunnel syndrome, left upper limb: G56.02

## 2021-12-11 DIAGNOSIS — M79602 Pain in left arm: Secondary | ICD-10-CM | POA: Diagnosis not present

## 2021-12-11 DIAGNOSIS — M7542 Impingement syndrome of left shoulder: Secondary | ICD-10-CM | POA: Diagnosis not present

## 2021-12-11 DIAGNOSIS — M25612 Stiffness of left shoulder, not elsewhere classified: Secondary | ICD-10-CM | POA: Diagnosis not present

## 2021-12-11 DIAGNOSIS — M2560 Stiffness of unspecified joint, not elsewhere classified: Secondary | ICD-10-CM | POA: Diagnosis not present

## 2021-12-11 DIAGNOSIS — M542 Cervicalgia: Secondary | ICD-10-CM | POA: Diagnosis not present

## 2021-12-11 DIAGNOSIS — G540 Brachial plexus disorders: Secondary | ICD-10-CM | POA: Diagnosis not present

## 2021-12-22 DIAGNOSIS — H04129 Dry eye syndrome of unspecified lacrimal gland: Secondary | ICD-10-CM | POA: Diagnosis not present

## 2021-12-22 DIAGNOSIS — R768 Other specified abnormal immunological findings in serum: Secondary | ICD-10-CM | POA: Diagnosis not present

## 2021-12-22 DIAGNOSIS — R200 Anesthesia of skin: Secondary | ICD-10-CM | POA: Diagnosis not present

## 2021-12-22 DIAGNOSIS — R682 Dry mouth, unspecified: Secondary | ICD-10-CM | POA: Diagnosis not present

## 2021-12-26 ENCOUNTER — Encounter: Payer: Self-pay | Admitting: Internal Medicine

## 2022-01-19 DIAGNOSIS — G43009 Migraine without aura, not intractable, without status migrainosus: Secondary | ICD-10-CM | POA: Diagnosis not present

## 2022-01-19 DIAGNOSIS — F330 Major depressive disorder, recurrent, mild: Secondary | ICD-10-CM | POA: Diagnosis not present

## 2022-01-19 DIAGNOSIS — R7303 Prediabetes: Secondary | ICD-10-CM | POA: Diagnosis not present

## 2022-01-19 DIAGNOSIS — E039 Hypothyroidism, unspecified: Secondary | ICD-10-CM | POA: Diagnosis not present

## 2022-01-21 ENCOUNTER — Telehealth: Payer: Self-pay | Admitting: Diagnostic Neuroimaging

## 2022-01-21 ENCOUNTER — Other Ambulatory Visit: Payer: Self-pay | Admitting: *Deleted

## 2022-01-21 ENCOUNTER — Encounter: Payer: Self-pay | Admitting: Diagnostic Neuroimaging

## 2022-01-21 DIAGNOSIS — R42 Dizziness and giddiness: Secondary | ICD-10-CM

## 2022-01-21 DIAGNOSIS — R200 Anesthesia of skin: Secondary | ICD-10-CM

## 2022-01-21 DIAGNOSIS — H5712 Ocular pain, left eye: Secondary | ICD-10-CM

## 2022-01-21 NOTE — Telephone Encounter (Signed)
An appointment is requested in approximately: 4 weeks Refer to [facility]  [id number] [place], [place] [address]   [phone])

## 2022-01-30 DIAGNOSIS — G43009 Migraine without aura, not intractable, without status migrainosus: Secondary | ICD-10-CM | POA: Diagnosis not present

## 2022-01-30 DIAGNOSIS — G509 Disorder of trigeminal nerve, unspecified: Secondary | ICD-10-CM | POA: Diagnosis not present

## 2022-02-12 DIAGNOSIS — G500 Trigeminal neuralgia: Secondary | ICD-10-CM | POA: Diagnosis not present

## 2022-02-12 DIAGNOSIS — H43393 Other vitreous opacities, bilateral: Secondary | ICD-10-CM | POA: Diagnosis not present

## 2022-02-12 DIAGNOSIS — H52203 Unspecified astigmatism, bilateral: Secondary | ICD-10-CM | POA: Diagnosis not present

## 2022-02-12 DIAGNOSIS — H04123 Dry eye syndrome of bilateral lacrimal glands: Secondary | ICD-10-CM | POA: Diagnosis not present

## 2022-02-12 DIAGNOSIS — G8929 Other chronic pain: Secondary | ICD-10-CM | POA: Diagnosis not present

## 2022-02-12 DIAGNOSIS — R519 Headache, unspecified: Secondary | ICD-10-CM | POA: Diagnosis not present

## 2022-02-12 DIAGNOSIS — H5203 Hypermetropia, bilateral: Secondary | ICD-10-CM | POA: Diagnosis not present

## 2022-02-12 DIAGNOSIS — H524 Presbyopia: Secondary | ICD-10-CM | POA: Diagnosis not present

## 2022-02-13 DIAGNOSIS — R200 Anesthesia of skin: Secondary | ICD-10-CM | POA: Diagnosis not present

## 2022-02-13 DIAGNOSIS — G500 Trigeminal neuralgia: Secondary | ICD-10-CM | POA: Diagnosis not present

## 2022-02-13 DIAGNOSIS — G509 Disorder of trigeminal nerve, unspecified: Secondary | ICD-10-CM | POA: Diagnosis not present

## 2022-02-13 DIAGNOSIS — R519 Headache, unspecified: Secondary | ICD-10-CM | POA: Diagnosis not present

## 2022-02-13 DIAGNOSIS — G43009 Migraine without aura, not intractable, without status migrainosus: Secondary | ICD-10-CM | POA: Diagnosis not present

## 2022-03-18 DIAGNOSIS — G509 Disorder of trigeminal nerve, unspecified: Secondary | ICD-10-CM | POA: Diagnosis not present

## 2022-03-18 DIAGNOSIS — G43009 Migraine without aura, not intractable, without status migrainosus: Secondary | ICD-10-CM | POA: Diagnosis not present

## 2022-03-18 DIAGNOSIS — G508 Other disorders of trigeminal nerve: Secondary | ICD-10-CM | POA: Diagnosis not present

## 2022-03-18 DIAGNOSIS — Z79899 Other long term (current) drug therapy: Secondary | ICD-10-CM | POA: Diagnosis not present

## 2022-03-18 DIAGNOSIS — G43909 Migraine, unspecified, not intractable, without status migrainosus: Secondary | ICD-10-CM | POA: Diagnosis not present

## 2022-03-18 DIAGNOSIS — R200 Anesthesia of skin: Secondary | ICD-10-CM | POA: Diagnosis not present

## 2022-03-18 DIAGNOSIS — R202 Paresthesia of skin: Secondary | ICD-10-CM | POA: Diagnosis not present

## 2022-03-18 DIAGNOSIS — H5712 Ocular pain, left eye: Secondary | ICD-10-CM | POA: Diagnosis not present

## 2022-03-26 DIAGNOSIS — U071 COVID-19: Secondary | ICD-10-CM | POA: Diagnosis not present

## 2022-04-23 DIAGNOSIS — I10 Essential (primary) hypertension: Secondary | ICD-10-CM | POA: Diagnosis not present

## 2022-04-23 DIAGNOSIS — E669 Obesity, unspecified: Secondary | ICD-10-CM | POA: Diagnosis not present

## 2022-04-23 DIAGNOSIS — Z23 Encounter for immunization: Secondary | ICD-10-CM | POA: Diagnosis not present

## 2022-04-23 DIAGNOSIS — R7303 Prediabetes: Secondary | ICD-10-CM | POA: Diagnosis not present

## 2022-04-23 DIAGNOSIS — Z0000 Encounter for general adult medical examination without abnormal findings: Secondary | ICD-10-CM | POA: Diagnosis not present

## 2022-04-23 DIAGNOSIS — Z6834 Body mass index (BMI) 34.0-34.9, adult: Secondary | ICD-10-CM | POA: Diagnosis not present

## 2022-04-23 DIAGNOSIS — E039 Hypothyroidism, unspecified: Secondary | ICD-10-CM | POA: Diagnosis not present

## 2022-04-23 DIAGNOSIS — E7800 Pure hypercholesterolemia, unspecified: Secondary | ICD-10-CM | POA: Diagnosis not present

## 2022-05-27 DIAGNOSIS — Z8709 Personal history of other diseases of the respiratory system: Secondary | ICD-10-CM | POA: Diagnosis not present

## 2022-05-27 DIAGNOSIS — R053 Chronic cough: Secondary | ICD-10-CM | POA: Diagnosis not present

## 2022-05-27 DIAGNOSIS — J383 Other diseases of vocal cords: Secondary | ICD-10-CM | POA: Diagnosis not present

## 2022-05-27 DIAGNOSIS — R490 Dysphonia: Secondary | ICD-10-CM | POA: Diagnosis not present

## 2022-05-27 DIAGNOSIS — J385 Laryngeal spasm: Secondary | ICD-10-CM | POA: Diagnosis not present

## 2022-05-29 ENCOUNTER — Other Ambulatory Visit: Payer: Self-pay

## 2022-05-29 ENCOUNTER — Other Ambulatory Visit: Payer: Self-pay | Admitting: Internal Medicine

## 2022-05-29 MED ORDER — CARISOPRODOL 350 MG PO TABS: 350.0000 mg | ORAL_TABLET | Freq: Three times a day (TID) | ORAL | 2 refills | Status: DC | PRN

## 2022-05-29 NOTE — Progress Notes (Signed)
Morgan [name] was refilled for 3 months.

## 2022-06-24 ENCOUNTER — Encounter: Payer: Self-pay | Admitting: Internal Medicine

## 2022-06-24 ENCOUNTER — Ambulatory Visit: Payer: PPO | Admitting: Internal Medicine

## 2022-06-24 VITALS — BP 128/82 | HR 81 | Temp 97.6°F | Resp 16 | Ht 64.0 in | Wt 204.0 lb

## 2022-06-24 DIAGNOSIS — G4700 Insomnia, unspecified: Secondary | ICD-10-CM

## 2022-06-24 HISTORY — DX: Insomnia, unspecified: G47.00

## 2022-06-24 MED ORDER — ZOLPIDEM TARTRATE 10 MG PO TABS: 10.0000 mg | ORAL_TABLET | Freq: Every evening | ORAL | 1 refills | Status: DC | PRN

## 2022-06-24 NOTE — Assessment & Plan Note (Signed)
I reviewed medication including her long chronic use of soma.  I only want her to use clonazepam as needed and not for sleep.  We are going to try her on some ambien.  She is not doing blue screens.

## 2022-06-24 NOTE — Progress Notes (Signed)
Office Visit  Subjective   Patient ID: Morgan [name]   DOB: [birth year]   Age: 61 y.o.   MRN: [id number]   Chief Complaint Chief Complaint  Patient presents with   Acute Visit    Insomnia     History of Present Illness Morgan [name] comes in today with complaints of continued insomnia.  I saw her in 04/2022 where she had more insomnia at that time since she had her COVID infection on 03/26/2022.  Unfortunately, her chronic cough recurred after her covid infection and she tells me that over the interim, she took tussionex and did not talk for 2 weeks and her chronic cough resolved.   In her regards to her insomnia, I had increased her amitriptyline from '25mg'$  to '50mg'$  at night which had helped but she developed dry mouth and eyes and again was weaned off her amitriptyline.   She remains on wellbutryin XL '300mg'$  daily, cymbalta '60mg'$  po daily, and clonazepam 0.'5mg'$  po BID prn for her history of depression.  She takes her clonazepam a few times a week for anxiety and sleep but recently she has been using this daily but she uses this at night but it is not helping.  Again, she was using  amitryptyline for her nerve pain down her left arm from her thoracic outlet syndrome but was also using this for her depression and sleep. She denies drinking caffeine and she has tried melatonin and benadryl which has not helped.       Past Medical History Past Medical History:  Diagnosis Date   Allergy    seasonal   Anemia    Anxiety    Arthritis    Asthma    Celiac disease    Depression    Fibromyalgia    Fibula fracture    GERD (gastroesophageal reflux disease)    Headache    HLD (hyperlipidemia)    Hx of migraines    Hypertension    Hypothyroidism    Lupus (HCC)    Myalgia and myositis, unspecified    Other B-complex deficiencies    PTSD (post-traumatic stress disorder)    Recurrent HSV (herpes simplex virus)    Thoracic outlet syndrome    Thyroiditis    Unspecified hemorrhoids without mention  of complication    Varicose veins of lower extremities with other complications    Vertigo      Allergies Allergies  Allergen Reactions   Iodine Other (See Comments)    REACTION: SOB with injectable iodine   Iohexol Shortness Of Breath   Gabapentin    Pregabalin Other (See Comments)    Altered mental status     Review of Systems Review of Systems  Constitutional:  Negative for chills and fever.  Eyes:  Negative for blurred vision and double vision.  Respiratory:  Negative for cough and shortness of breath.   Cardiovascular:  Negative for chest pain and leg swelling.  Gastrointestinal:  Negative for constipation, diarrhea, nausea and vomiting.  Musculoskeletal:  Negative for myalgias.  Neurological:  Negative for dizziness, weakness and headaches.  Psychiatric/Behavioral:         No worsening depression or anxiety.       Objective:    Vitals BP 128/82   Pulse 81   Temp 97.6 F (36.4 C)   Resp 16   Ht '5\' 4"'$  (1.626 m)   Wt 204 lb (92.5 kg)   SpO2 96%   BMI 35.02 kg/m    Physical Examination Physical Exam Constitutional:  Appearance: Normal appearance. She is not ill-appearing.  Cardiovascular:     Rate and Rhythm: Normal rate and regular rhythm.     Pulses: Normal pulses.     Heart sounds: No murmur heard.    No friction rub. No gallop.  Pulmonary:     Effort: Pulmonary effort is normal. No respiratory distress.     Breath sounds: No wheezing, rhonchi or rales.  Abdominal:     General: Abdomen is flat. Bowel sounds are normal. There is no distension.     Palpations: Abdomen is soft.     Tenderness: There is no abdominal tenderness.  Musculoskeletal:     Right lower leg: No edema.     Left lower leg: No edema.  Skin:    General: Skin is warm and dry.     Findings: No rash.  Neurological:     Mental Status: She is alert.  Psychiatric:        Mood and Affect: Mood normal.        Assessment & Plan:   Insomnia I reviewed medication including  her long chronic use of soma.  I only want her to use clonazepam as needed and not for sleep.  We are going to try her on some ambien.  She is not doing blue screens.    No follow-ups on file.   Townsend Roger, MD

## 2022-07-10 ENCOUNTER — Other Ambulatory Visit: Payer: Self-pay

## 2022-07-10 MED ORDER — ROSUVASTATIN CALCIUM 10 MG PO TABS: 10.0000 mg | ORAL_TABLET | Freq: Every day | ORAL | 1 refills | Status: DC

## 2022-07-24 ENCOUNTER — Encounter: Payer: Self-pay | Admitting: Internal Medicine

## 2022-07-24 ENCOUNTER — Ambulatory Visit: Payer: PPO | Admitting: Internal Medicine

## 2022-07-24 VITALS — BP 110/62 | HR 70 | Temp 98.0°F | Resp 16 | Ht 64.0 in | Wt 201.6 lb

## 2022-07-24 DIAGNOSIS — G4700 Insomnia, unspecified: Secondary | ICD-10-CM | POA: Diagnosis not present

## 2022-07-24 DIAGNOSIS — F411 Generalized anxiety disorder: Secondary | ICD-10-CM

## 2022-07-24 DIAGNOSIS — F330 Major depressive disorder, recurrent, mild: Secondary | ICD-10-CM

## 2022-07-24 DIAGNOSIS — R002 Palpitations: Secondary | ICD-10-CM

## 2022-07-24 HISTORY — DX: Palpitations: R00.2

## 2022-07-24 HISTORY — DX: Major depressive disorder, recurrent, mild: F33.0

## 2022-07-24 MED ORDER — CARISOPRODOL 350 MG PO TABS: 350.0000 mg | ORAL_TABLET | Freq: Three times a day (TID) | ORAL | 0 refills | Status: AC | PRN

## 2022-07-24 MED ORDER — CLONAZEPAM 0.5 MG PO TABS: 0.5000 mg | ORAL_TABLET | Freq: Two times a day (BID) | ORAL | 2 refills | Status: DC

## 2022-07-24 NOTE — Assessment & Plan Note (Addendum)
An EKG showd NSR.  We will refer her to cardiology for a zio patch.

## 2022-07-24 NOTE — Assessment & Plan Note (Signed)
Her depression is mild recurrent major depression that is controlled.  We will cotninue her current meds.

## 2022-07-24 NOTE — Addendum Note (Signed)
Addended by: Townsend Roger on: 07/24/2022 04:43 PM   Modules accepted: Orders

## 2022-07-24 NOTE — Assessment & Plan Note (Signed)
Her anxiety is a bit worse but I think this is situational.  We will continue to monitor.

## 2022-07-24 NOTE — Assessment & Plan Note (Signed)
She has stopped using her clonazpeam for sleep and she is using [place].  We will cotninue to monitor.

## 2022-07-24 NOTE — Progress Notes (Addendum)
Office Visit  Subjective   Patient ID: Morgan [name]   DOB: 04/05/1961   Age: 62 y.o.   MRN: [id number]   Chief Complaint Chief Complaint  Patient presents with   Follow-up    Hypertension     History of Present Illness The patient saw me for her annual exam in 04/2022 where she had a recurrence of her neurogenic cough after experiencing COVDI-19 in 03/2022. Since her last visit, her cough has now resolved.  The patient does have a history of neurogenic cough where she has been followed by [facility] Ot[facility] and Pulmonary.  She had problems with coughing for 2 years where she had developed neurogenic cough from the whooping cough.  She ended up with a laryngeal nerve block in 02/2021 where her cough resolved at that time.  ENT have advised her to continue with nasal sinus irrigations, Flonase, Nexium, Amitriptyline and wean off of Clonidine at that time.  She had nerve ablation x 2 and a nerve block which did not help.  Again, she did have a left sided laryngeal nerve block done in 02/2021 and this has resolved her chronic cough at that time.  She stopped coughing 1-2 days after the procedure.  She was seen by ENT and pulmonary at [facility] in the past  where they wanted to change her beta blocker to a calcium channel blocker and stop her clonidine.  She again had pertussis and ended up having 3 rib fractures on the left.  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  Dr. Gardenia Phlegm from pulmonary did a bronchoscopy in 06/2018 and initially scheduled her to see pulmonary at [facility] since the etiology of her chronic cough was not discovered.  Again, she has a history of muscle spasm located in her left chest and shoulder area.  This has been a chronic ongoing problem since she had her brachial plexus release surgery in 2016 where she had thoracic outlet obstruction. Again, she broke multiple ribs on her left side due to chronic cough where  she had pertussis.  She is regularly on soma for her spasms.  Today, she denies any f/c, SOB, wheezing, or reflux problems.   The patient is a 62 yo female returns for followup of her depression and anxiety as well as her PTSD.  She is having some anxiety from her palpitations (see below).  Today, she states her depression is mild but her anxiety is moderate.  She is also having health problems with her dad.  I saw her earlier in the year and she told me there was no change in her depression/anxiety or her PTSD.  I did see her in 06/24/2022 for worsening insomnia since her COVID-19 infection.  I had a discussion with her regarding her soma and clonazepam.  I only wanted her to use clonazepam as needed and not for sleep.  We started her on a trial of Lorrin Mais which she states is helping.  I had increased her amitriptyline from '25mg'$  to '50mg'$  at night which had helped but she developed dry mouth and eyes and again was weaned off her amitriptyline.  She remains on wellbutryin XL '300mg'$  daily, cymbalta '60mg'$  po daily, and clonazepam 0.'5mg'$  po BID prn which she she is using 3-4 times per day during the day.  She is no longer seeing a counsellor due to the COVID-19 pandemic.   She denies difficulty concentrating, difficulty performing routine daily activities,  fatigue, helpless feeling, suicidal ideation, loss of appetite, and social withdrawal. This patient feels that she is able to care for herself. She currently lives alone. She has no significant prior history of mental health disorders. She was using  amitryptyline for her nerve pain down her left arm from her thoracic outlet syndrome but was also using this for her depression and sleep.    She also states she has been having some heart palpitations that started about a month ago.  She is having this several times a week and sometimes it wakes her up.  There is no chest pain at that time or SOB or other symptoms.  This "fluttering" will last 30 minutes.  She is not sure  if this heart fluttering is from anxiety.            Past Medical History Past Medical History:  Diagnosis Date   Allergy    seasonal   Anemia    Anxiety    Arthritis    Asthma    Celiac disease    Depression    Fibromyalgia    Fibula fracture    GERD (gastroesophageal reflux disease)    Headache    HLD (hyperlipidemia)    Hx of migraines    Hypertension    Hypothyroidism    Lupus (HCC)    Myalgia and myositis, unspecified    Other B-complex deficiencies    PTSD (post-traumatic stress disorder)    Recurrent HSV (herpes simplex virus)    Thoracic outlet syndrome    Thyroiditis    Unspecified hemorrhoids without mention of complication    Varicose veins of lower extremities with other complications    Vertigo      Allergies Allergies  Allergen Reactions   Iodine Other (See Comments)    REACTION: SOB with injectable iodine   Iohexol Shortness Of Breath   Gabapentin    Pregabalin Other (See Comments)    Altered mental status     Medications  Current Outpatient Medications:    albuterol (PROVENTIL HFA;VENTOLIN HFA) 108 (90 BASE) MCG/ACT inhaler, Inhale 1-2 puffs into the lungs every 6 (six) hours as needed for wheezing or shortness of breath. , Disp: , Rfl:    amLODipine (NORVASC) 10 MG tablet, Take 10 mg by mouth daily., Disp: , Rfl:    Azelastine HCl 137 MCG/SPRAY SOLN, Place into the nose., Disp: , Rfl:    budesonide-formoterol (SYMBICORT) 160-4.5 MCG/ACT inhaler, Inhale 2 puffs into the lungs at bedtime., Disp: , Rfl:    buPROPion (WELLBUTRIN XL) 300 MG 24 hr tablet, Take 300 mg by mouth daily., Disp: , Rfl:    carisoprodol (SOMA) 350 MG tablet, Take 1 tablet (350 mg total) by mouth 3 (three) times daily as needed for muscle spasms. Take 1 tab po TID and 1 tab at bedtime, Disp: 90 tablet, Rfl: 0   clobetasol ointment (TEMOVATE) 9.38 %, Apply 1 application topically 2 (two) times daily as needed., Disp: , Rfl:    clonazePAM (KLONOPIN) 0.5 MG tablet, Take 1  tablet (0.5 mg total) by mouth 2 (two) times daily., Disp: 30 tablet, Rfl: 2   Cyanocobalamin (B-12) 3000 MCG CAPS, Take by mouth., Disp: , Rfl:    dapsone 25 MG tablet, Take 25-100 mg by mouth daily as needed (for dermatitis). , Disp: , Rfl:    diphenhydrAMINE (BENADRYL) 50 MG capsule, Take 50 mg by mouth 1 hour prior to your procedure., Disp: 1 capsule, Rfl: 0   DULoxetine (CYMBALTA) 60 MG capsule,  Take 60 mg by mouth daily. Take with 20 mg to equal 80 mg daily., Disp: , Rfl:    esomeprazole (NEXIUM) 40 MG capsule, Take 40 mg by mouth daily at 12 noon., Disp: , Rfl:    famotidine (PEPCID) 10 MG tablet, Take 1 tablet by mouth at bedtime., Disp: , Rfl:    fluticasone (FLONASE) 50 MCG/ACT nasal spray, Place 1 spray into both nostrils daily., Disp: , Rfl:    hyoscyamine (LEVSIN SL) 0.125 MG SL tablet, Place 0.125 mg under the tongue every 4 (four) hours as needed for cramping., Disp: , Rfl:    ibuprofen (ADVIL,MOTRIN) 200 MG tablet, Take 800 mg by mouth 2 (two) times daily as needed for headache or moderate pain., Disp: , Rfl:    Levothyroxine Sodium 100 MCG CAPS, Take 100 mcg by mouth daily. , Disp: , Rfl:    loratadine (CLARITIN) 10 MG tablet, Take 10 mg by mouth daily.  , Disp: , Rfl:    meclizine (ANTIVERT) 25 MG tablet, , Disp: , Rfl:    metoprolol tartrate (LOPRESSOR) 25 MG tablet, Take 25 mg by mouth 2 (two) times daily., Disp: , Rfl:    Rimegepant Sulfate (NURTEC) 75 MG TBDP, Take by mouth as needed., Disp: , Rfl:    rosuvastatin (CRESTOR) 10 MG tablet, Take 1 tablet (10 mg total) by mouth at bedtime., Disp: 90 tablet, Rfl: 1   valACYclovir (VALTREX) 500 MG tablet, Take 500-1,000 mg by mouth daily as needed (for outbreaks)., Disp: , Rfl:    zolpidem (AMBIEN) 10 MG tablet, Take 1 tablet (10 mg total) by mouth at bedtime as needed for sleep., Disp: 30 tablet, Rfl: 1   Review of Systems Review of Systems  Constitutional:  Negative for chills and fever.  Eyes:  Negative for blurred vision and  double vision.  Respiratory:  Negative for cough, shortness of breath and wheezing.   Cardiovascular:  Positive for palpitations. Negative for chest pain and leg swelling.  Gastrointestinal:  Negative for abdominal pain, constipation, diarrhea, heartburn, nausea and vomiting.  Musculoskeletal:  Negative for myalgias.  Neurological:  Negative for dizziness, weakness and headaches.  Psychiatric/Behavioral:  Negative for depression. The patient is nervous/anxious. The patient does not have insomnia.        Objective:    Vitals BP 110/62   Pulse 70   Temp 98 F (36.7 C)   Resp 16   Ht '5\' 4"'$  (1.626 m)   Wt 201 lb 9.6 oz (91.4 kg)   SpO2 97%   BMI 34.60 kg/m    Physical Examination Physical Exam Constitutional:      Appearance: Normal appearance. She is not ill-appearing.  Cardiovascular:     Rate and Rhythm: Normal rate and regular rhythm.     Pulses: Normal pulses.     Heart sounds: No murmur heard.    No friction rub. No gallop.  Pulmonary:     Effort: Pulmonary effort is normal. No respiratory distress.     Breath sounds: No wheezing, rhonchi or rales.  Abdominal:     General: Abdomen is flat. Bowel sounds are normal. There is no distension.     Palpations: Abdomen is soft.     Tenderness: There is no abdominal tenderness.  Musculoskeletal:     Right lower leg: No edema.     Left lower leg: No edema.  Skin:    General: Skin is warm and dry.     Findings: No rash.  Neurological:     Mental  Status: She is alert.        Assessment & Plan:   Palpitations An EKG showd NSR.  We will refer her to cardiology for a zio patch.  Insomnia She has stopped using her clonazpeam for sleep and she is using [place].  We will cotninue to monitor.  GAD (generalized anxiety disorder) Her anxiety is a bit worse but I think this is situational.  We will continue to monitor.  Mild episode of recurrent major depressive disorder (HCC) Her depression is mild recurrent major  depression that is controlled.  We will cotninue her current meds.    Return in about 3 months (around 10/23/2022).   Townsend Roger, MD

## 2022-08-14 ENCOUNTER — Telehealth: Payer: Self-pay

## 2022-08-14 ENCOUNTER — Ambulatory Visit: Payer: PPO

## 2022-08-14 NOTE — Progress Notes (Signed)
CMCS Notes - February 2024  08/13/22 - Left detailed VM to remind pt of her initial [facility] Televisit with Jerral Ralph, PharmD. I asked pt to call us back if she needs to r/s.  08/14/22 - Start:10:03 AM. Reviewed chart extensively to complete initial chart prep documntation. Pt will speak with Sushama on 08/14/22 at 1:30pm. - JHM, LPN.

## 2022-08-20 DIAGNOSIS — M1990 Unspecified osteoarthritis, unspecified site: Secondary | ICD-10-CM | POA: Diagnosis not present

## 2022-08-20 DIAGNOSIS — Z91041 Radiographic dye allergy status: Secondary | ICD-10-CM | POA: Diagnosis not present

## 2022-08-20 DIAGNOSIS — R079 Chest pain, unspecified: Secondary | ICD-10-CM | POA: Diagnosis not present

## 2022-08-20 DIAGNOSIS — R0602 Shortness of breath: Secondary | ICD-10-CM | POA: Diagnosis not present

## 2022-08-20 DIAGNOSIS — J45909 Unspecified asthma, uncomplicated: Secondary | ICD-10-CM | POA: Diagnosis not present

## 2022-08-20 DIAGNOSIS — R002 Palpitations: Secondary | ICD-10-CM | POA: Diagnosis not present

## 2022-08-20 DIAGNOSIS — F32A Depression, unspecified: Secondary | ICD-10-CM | POA: Diagnosis not present

## 2022-08-20 DIAGNOSIS — E039 Hypothyroidism, unspecified: Secondary | ICD-10-CM | POA: Diagnosis not present

## 2022-08-20 DIAGNOSIS — R0609 Other forms of dyspnea: Secondary | ICD-10-CM | POA: Diagnosis not present

## 2022-08-20 DIAGNOSIS — I10 Essential (primary) hypertension: Secondary | ICD-10-CM | POA: Diagnosis not present

## 2022-08-20 DIAGNOSIS — F419 Anxiety disorder, unspecified: Secondary | ICD-10-CM | POA: Diagnosis not present

## 2022-08-20 DIAGNOSIS — Z79899 Other long term (current) drug therapy: Secondary | ICD-10-CM | POA: Diagnosis not present

## 2022-08-20 DIAGNOSIS — E7800 Pure hypercholesterolemia, unspecified: Secondary | ICD-10-CM | POA: Diagnosis not present

## 2022-08-20 DIAGNOSIS — R0789 Other chest pain: Secondary | ICD-10-CM | POA: Diagnosis not present

## 2022-08-21 DIAGNOSIS — R079 Chest pain, unspecified: Secondary | ICD-10-CM

## 2022-08-21 DIAGNOSIS — R0609 Other forms of dyspnea: Secondary | ICD-10-CM | POA: Diagnosis not present

## 2022-08-21 DIAGNOSIS — F32A Depression, unspecified: Secondary | ICD-10-CM | POA: Diagnosis not present

## 2022-08-26 ENCOUNTER — Ambulatory Visit: Payer: PPO | Admitting: Internal Medicine

## 2022-08-26 ENCOUNTER — Encounter: Payer: Self-pay | Admitting: Internal Medicine

## 2022-08-26 VITALS — BP 124/82 | HR 108 | Temp 98.0°F | Resp 20 | Ht 64.0 in | Wt 200.6 lb

## 2022-08-26 DIAGNOSIS — R079 Chest pain, unspecified: Secondary | ICD-10-CM

## 2022-08-26 DIAGNOSIS — R0600 Dyspnea, unspecified: Secondary | ICD-10-CM

## 2022-08-26 DIAGNOSIS — Z91041 Radiographic dye allergy status: Secondary | ICD-10-CM | POA: Diagnosis not present

## 2022-08-26 HISTORY — DX: Chest pain, unspecified: R07.9

## 2022-08-26 HISTORY — DX: Dyspnea, unspecified: R06.00

## 2022-08-26 MED ORDER — DIPHENHYDRAMINE HCL 50 MG PO TABS: 50.0000 mg | ORAL_TABLET | Freq: Once | ORAL | 0 refills | Status: DC

## 2022-08-26 MED ORDER — PREDNISONE 50 MG PO TABS: 50.0000 mg | ORAL_TABLET | Freq: Four times a day (QID) | ORAL | 0 refills | Status: AC

## 2022-08-26 NOTE — Assessment & Plan Note (Signed)
She is now having dyspnea and chest pain.  She states she was having some when she walked into our office but her EKG here showed NSR without ST/T wave changes.  She has an appointment with cardiology on 09/04/2022.  I am however going to get a CTA of her chest to make sure she does not have a PE.  She has a history of iodine contrast allergy (reaction was SOB) where she had a CT done several years ago where they just pretreated her with benadryl.  We will pretreat with prednisone and benadryl.

## 2022-08-26 NOTE — Progress Notes (Signed)
Office Visit  Subjective   Patient ID: Morgan [name]   DOB: [birth year]   Age: 62 y.o.   MRN: [id number]   Chief Complaint Chief Complaint  Patient presents with   Follow-up    RH E/D     History of Present Illness Morgan [name] is a 62 yo female who comes in for a hospital followup where she was admitted to [facility] from 2/8 until 2/9 where she was seen for SOB and chest pain.  She had serial cardiac enzymes that were negative and she has no evidence of EKG changes.  There was no SOB but she continued to have chest pain throughout her hospitalization.  The patient underwent nuclear stress testing where the vasodilator stress test actually made her pain worse however she showed no evidence of reversible ischemia and she had a preserved EF.  There was no evidence of arrythmia seen on telemetry.  They felt her chest pain was like due to musculoskeletal versus anxiety related.  I saw her a month ago where she only had heart palpitations and we set her up to see cardiology whom she sees on 10/03/2022.  She states that anytime she exerts herself, she will get substernal pressure/pain which she states it goes away with rest.  It can take her 30 min for this pain to go away.  There is no associated SOB but she does get nauseated with it as well as palpitations as well as dizziness.       Past Medical History Past Medical History:  Diagnosis Date   Allergy    seasonal   Anemia    Anxiety    Arthritis    Asthma    Celiac disease    Depression    Fibromyalgia    Fibula fracture    GERD (gastroesophageal reflux disease)    Headache    HLD (hyperlipidemia)    Hx of migraines    Hypertension    Hypothyroidism    Lupus (HCC)    Myalgia and myositis, unspecified    Other B-complex deficiencies    PTSD (post-traumatic stress disorder)    Recurrent HSV (herpes simplex virus)    Thoracic outlet syndrome    Thyroiditis    Unspecified hemorrhoids without mention of complication    Varicose veins of  lower extremities with other complications    Vertigo      Allergies Allergies  Allergen Reactions   Iodine Other (See Comments)    REACTION: SOB with injectable iodine   Iohexol Shortness Of Breath   Gabapentin    Pregabalin Other (See Comments)    Altered mental status     Medications  Current Outpatient Medications:    albuterol (PROVENTIL HFA;VENTOLIN HFA) 108 (90 BASE) MCG/ACT inhaler, Inhale 1-2 puffs into the lungs every 6 (six) hours as needed for wheezing or shortness of breath. , Disp: , Rfl:    amLODipine (NORVASC) 10 MG tablet, Take 10 mg by mouth daily., Disp: , Rfl:    Azelastine HCl 137 MCG/SPRAY SOLN, Place into the nose., Disp: , Rfl:    budesonide-formoterol (SYMBICORT) 160-4.5 MCG/ACT inhaler, Inhale 2 puffs into the lungs at bedtime., Disp: , Rfl:    buPROPion (WELLBUTRIN XL) 300 MG 24 hr tablet, Take 1 tablet by mouth daily., Disp: , Rfl:    carisoprodol (SOMA) 350 MG tablet, Take 1 tablet (350 mg total) by mouth 3 (three) times daily as needed for muscle spasms. Take 1 tab po TID and 1 tab at bedtime, Disp:  90 tablet, Rfl: 0   clobetasol ointment (TEMOVATE) [id number] %, Apply 1 application topically 2 (two) times daily as needed., Disp: , Rfl:    clonazePAM (KLONOPIN) 0.5 MG tablet, Take 1 tablet (0.5 mg total) by mouth 2 (two) times daily., Disp: 30 tablet, Rfl: 2   Cyanocobalamin (B-12) 3000 MCG CAPS, Take by mouth., Disp: , Rfl:    dapsone 25 MG tablet, Take 25-100 mg by mouth daily as needed (for dermatitis). , Disp: , Rfl:    diphenhydrAMINE (BENADRYL) 50 MG capsule, Take 50 mg by mouth 1 hour prior to your procedure., Disp: 1 capsule, Rfl: 0   DULoxetine (CYMBALTA) 60 MG capsule, Take 60 mg by mouth daily. Take with 20 mg to equal 80 mg daily., Disp: , Rfl:    esomeprazole (NEXIUM) 40 MG capsule, Take 40 mg by mouth daily at 12 noon., Disp: , Rfl:    famotidine (PEPCID) 10 MG tablet, Take 1 tablet by mouth at bedtime., Disp: , Rfl:    fluticasone (FLONASE) 50  MCG/ACT nasal spray, Place 1 spray into both nostrils daily., Disp: , Rfl:    hyoscyamine (LEVSIN SL) 0.125 MG SL tablet, Place 0.125 mg under the tongue every 4 (four) hours as needed for cramping., Disp: , Rfl:    ibuprofen (ADVIL,MOTRIN) 200 MG tablet, Take 800 mg by mouth 2 (two) times daily as needed for headache or moderate pain., Disp: , Rfl:    Levothyroxine Sodium 100 MCG CAPS, Take 100 mcg by mouth daily. , Disp: , Rfl:    loratadine (CLARITIN) 10 MG tablet, Take 10 mg by mouth daily.  , Disp: , Rfl:    meclizine (ANTIVERT) 25 MG tablet, , Disp: , Rfl:    metoprolol tartrate (LOPRESSOR) 25 MG tablet, Take 25 mg by mouth 2 (two) times daily., Disp: , Rfl:    Rimegepant Sulfate (NURTEC) 75 MG TBDP, Take by mouth as needed., Disp: , Rfl:    rosuvastatin (CRESTOR) 10 MG tablet, Take 1 tablet (10 mg total) by mouth at bedtime., Disp: 90 tablet, Rfl: 1   valACYclovir (VALTREX) 500 MG tablet, Take 500-1,000 mg by mouth daily as needed (for outbreaks)., Disp: , Rfl:    zolpidem (AMBIEN) 10 MG tablet, Take 1 tablet (10 mg total) by mouth at bedtime as needed for sleep., Disp: 30 tablet, Rfl: 1   Review of Systems Review of Systems  Constitutional:  Negative for chills, fever, malaise/fatigue and weight loss.  Respiratory:  Negative for cough and shortness of breath.   Cardiovascular:  Negative for chest pain, palpitations and leg swelling.  Gastrointestinal:  Negative for abdominal pain, constipation, diarrhea, heartburn, nausea and vomiting.  Musculoskeletal:  Negative for myalgias.  Skin:  Negative for itching and rash.  Neurological:  Negative for dizziness, weakness and headaches.       Objective:    Vitals BP 124/82 (BP Location: Right Arm, Patient Position: Sitting, Cuff Size: Normal)   Pulse (!) 108   Temp 98 F (36.7 C) (Temporal)   Resp 20   Ht 5' 4"$  (1.626 m)   Wt 200 lb 9.6 oz (91 kg)   SpO2 96%   BMI 34.43 kg/m    Physical Examination Physical Exam Constitutional:       Appearance: Normal appearance. She is not ill-appearing.  Cardiovascular:     Rate and Rhythm: Normal rate and regular rhythm.     Pulses: Normal pulses.     Heart sounds: No murmur heard.    No friction rub.  No gallop.  Pulmonary:     Effort: Pulmonary effort is normal. No respiratory distress.     Breath sounds: No wheezing, rhonchi or rales.  Abdominal:     General: Bowel sounds are normal. There is no distension.     Palpations: Abdomen is soft.     Tenderness: There is no abdominal tenderness.  Musculoskeletal:     Right lower leg: No edema.     Left lower leg: No edema.  Skin:    General: Skin is warm and dry.     Findings: No rash.  Neurological:     Mental Status: She is alert.        Assessment & Plan:   Dyspnea She is now having dyspnea and chest pain.  She states she was having some when she walked into our office but her EKG here showed NSR without ST/T wave changes.  She has an appointment with cardiology on 09/04/2022.  I am however going to get a CTA of her chest to make sure she does not have a PE.  She has a history of iodine contrast allergy (reaction was SOB) where she had a CT done several years ago where they just pretreated her with benadryl.  We will pretreat with prednisone and benadryl.    No follow-ups on file.   Townsend Roger, MD

## 2022-08-28 ENCOUNTER — Ambulatory Visit: Payer: PPO | Admitting: Internal Medicine

## 2022-08-31 DIAGNOSIS — R0609 Other forms of dyspnea: Secondary | ICD-10-CM | POA: Diagnosis not present

## 2022-08-31 DIAGNOSIS — R42 Dizziness and giddiness: Secondary | ICD-10-CM | POA: Diagnosis not present

## 2022-08-31 DIAGNOSIS — R002 Palpitations: Secondary | ICD-10-CM | POA: Diagnosis not present

## 2022-08-31 DIAGNOSIS — Z79899 Other long term (current) drug therapy: Secondary | ICD-10-CM | POA: Diagnosis not present

## 2022-08-31 DIAGNOSIS — I10 Essential (primary) hypertension: Secondary | ICD-10-CM | POA: Diagnosis not present

## 2022-08-31 DIAGNOSIS — R0602 Shortness of breath: Secondary | ICD-10-CM | POA: Diagnosis not present

## 2022-08-31 DIAGNOSIS — R0789 Other chest pain: Secondary | ICD-10-CM | POA: Diagnosis not present

## 2022-08-31 DIAGNOSIS — R079 Chest pain, unspecified: Secondary | ICD-10-CM | POA: Diagnosis not present

## 2022-08-31 DIAGNOSIS — J45909 Unspecified asthma, uncomplicated: Secondary | ICD-10-CM | POA: Diagnosis not present

## 2022-08-31 DIAGNOSIS — E039 Hypothyroidism, unspecified: Secondary | ICD-10-CM | POA: Diagnosis not present

## 2022-08-31 DIAGNOSIS — D649 Anemia, unspecified: Secondary | ICD-10-CM | POA: Diagnosis not present

## 2022-08-31 NOTE — Progress Notes (Signed)
Cardiology Office Note:   Date:  09/04/2022  NAME:  Morgan [name]    MRN: [id number] DOB:  1961/04/28   PCP:  Townsend Roger, MD  Cardiologist:  None  Electrophysiologist:  None   Referring MD: Townsend Roger, MD   Chief Complaint  Patient presents with   Chest Pain   History of Present Illness:   Morgan [name] is a 62 y.o. female with a hx of hypertension hyperlipidemia, fibromyalgia who is being seen today for the evaluation of chest pain at the request of Townsend Roger, MD. seen in the emergency room at [facility] on 08/20/2022 for chest pain.  Underwent stress test that was normal.  She reports for the past 2 weeks she has had episodes of tachycardia, shortness of breath and chest pressure.  Symptoms occur with activity.  She reports her heart can race.  She feels uncomfortable with this.  She describes shortness of breath and chest discomfort.  She was admitted to [facility].  She presents a consultation report from cardiology.  This showed a normal Lexiscan nuclear medicine stress test.  She was not anemic.  She tells me she had an echocardiogram but she does not have the report.  She was discharged home with follow-up with her primary care physician.  Due to persistent symptoms she was referred to [place].  She reports no prior history of heart disease.  She does not smoke.  No alcohol or drug use is reported.  She is currently disabled.  She is not married.  She does not have children.  She presents with her sister.  Her EKG is normal.  She reports she is having chest pain today.  She reports no change in anxiety levels.  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  She reports things have been stable other than fatigue.  I have encouraged her to reach out to her primary care physician.  CVD risk factors include hypertension and hyperlipidemia.  Her blood pressure is well-controlled.  Her examination is normal.  She seems to have had reassuring  workup at [facility].  Problem List HTN HLD MCTD  Past Medical History: Past Medical History:  Diagnosis Date   Allergy    seasonal   Anemia    Anxiety    Arthritis    Asthma    Celiac disease    Depression    Fibromyalgia    Fibula fracture    GERD (gastroesophageal reflux disease)    Headache    HLD (hyperlipidemia)    Hx of migraines    Hypertension    Hypothyroidism    Lupus ([place])    Myalgia and myositis, unspecified    Other B-complex deficiencies    PTSD (post-traumatic stress disorder)    Recurrent HSV (herpes simplex virus)    Thoracic outlet syndrome    Thyroiditis    Unspecified hemorrhoids without mention of complication    Varicose veins of lower extremities with other complications    Vertigo     Past Surgical History: Past Surgical History:  Procedure Laterality Date   ABDOMINAL HYSTERECTOMY  1998   COLONOSCOPY  05/14/1999   biopsy negative   ESOPHAGOGASTRODUODENOSCOPY  07/13/2002   normal   FIBULA FRACTURE SURGERY     MANDIBLE SURGERY     NASAL SINUS SURGERY     OTHER SURGICAL HISTORY     laser surgery for endometriosis   TOTAL VAGINAL HYSTERECTOMY  04/12/1997   VENOGRAM N/A 03/06/2014  Procedure: VENOGRAM;  Surgeon: Conrad Bergen, MD;  Location: [facility] CATH LAB;  Service: Cardiovascular;  Laterality: N/A;    Current Medications: Current Meds  Medication Sig   amLODipine (NORVASC) 10 MG tablet Take 10 mg by mouth daily.   budesonide-formoterol (SYMBICORT) 160-4.5 MCG/ACT inhaler Inhale 2 puffs into the lungs at bedtime.   buPROPion (WELLBUTRIN XL) 300 MG 24 hr tablet Take 1 tablet by mouth daily.   carisoprodol (SOMA) 350 MG tablet Take 1 tablet (350 mg total) by mouth 3 (three) times daily as needed for muscle spasms. Take 1 tab po TID and 1 tab at bedtime   clonazePAM (KLONOPIN) 0.5 MG tablet Take 1 tablet (0.5 mg total) by mouth 2 (two) times daily.   Cyanocobalamin (B-12) 3000 MCG CAPS Take by mouth.   dapsone 25 MG tablet Take  25-100 mg by mouth daily as needed (for dermatitis).    diphenhydrAMINE (BENADRYL) 50 MG tablet Take with your last prednisone tablet  before leaving for test.   DULoxetine (CYMBALTA) 60 MG capsule Take 1 capsule by mouth daily.   esomeprazole (NEXIUM) 40 MG capsule Take 40 mg by mouth daily at 12 noon.   famotidine (PEPCID) 10 MG tablet Take 1 tablet by mouth at bedtime.   fluticasone (FLONASE) 50 MCG/ACT nasal spray Place 1 spray into both nostrils daily.   ibuprofen (ADVIL,MOTRIN) 200 MG tablet Take 800 mg by mouth 2 (two) times daily as needed for headache or moderate pain.   Levothyroxine Sodium 100 MCG CAPS Take 100 mcg by mouth daily.    loratadine (CLARITIN) 10 MG tablet Take 10 mg by mouth daily.     metoprolol tartrate (LOPRESSOR) 25 MG tablet Take 25 mg by mouth 2 (two) times daily.   predniSONE (DELTASONE) 50 MG tablet Take 1 Tablet every 6 hours starting the night before the CT. Take last dose before you leave for the test.   rosuvastatin (CRESTOR) 10 MG tablet Take 1 tablet (10 mg total) by mouth at bedtime.   Sod Fluoride-Potassium Nitrate (SODIUM FLUORIDE 5000 SENSITIVE) 1.1-5 % GEL Take by mouth 2 (two) times daily.   zolpidem (AMBIEN) 10 MG tablet Take 1 tablet (10 mg total) by mouth at bedtime as needed for sleep.     Allergies:    Iodine, Iohexol, Gabapentin, and Pregabalin   Social History: Social History   Socioeconomic History   Marital status: Divorced    Spouse name: Not on file   Number of children: 0   Years of education: Not on file   Highest education level: Not on file  Occupational History   Occupation: Environmental health practitioner: [employer]   Occupation: Disability  Tobacco Use   Smoking status: Never   Smokeless tobacco: Never  Substance and Sexual Activity   Alcohol use: Yes    Comment: Very rare   Drug use: No   Sexual activity: Not on file  Other Topics Concern   Not on file  Social History Narrative   Divorced    No children    Living  in [place] (12/10)   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  ROS:   All other ROS reviewed and negative. Pertinent positives noted in the HPI.     EKGs/Labs/Other Studies Reviewed:   The following studies were personally reviewed by me today:  EKG:  EKG is ordered today.  The ekg ordered today demonstrates normal sinus rhythm heart rate 65, no acute changes, and was personally reviewed by me.   Recent Labs: No results found for requested labs within last 365 days.   Recent Lipid Panel    Component Value Date/Time   CHOL 174 08/04/2011 0500   TRIG 54.0 08/04/2011 0500   HDL 39.00 (L) 08/04/2011 0500   CHOLHDL 4 08/04/2011 0500   VLDL 10.8 08/04/2011 0500   LDLCALC 124 (H) 08/04/2011 0500    Physical Exam:   VS:  BP 118/82 (BP Location: Left Arm, Patient Position: Sitting, Cuff Size: Large)   Pulse (!) 54   Ht '5\' 4"'$  (1.626 m)   Wt 200 lb 3.2 oz (90.8 kg)   SpO2 98%   BMI 34.36 kg/m    Wt Readings from Last 3 Encounters:  09/04/22 200 lb 3.2 oz (90.8 kg)  08/26/22 200 lb 9.6 oz (91 kg)  07/24/22 201 lb 9.6 oz (91.4 kg)    General: Well nourished, well developed, in no acute distress Head: Atraumatic, normal size  Eyes: PEERLA, EOMI  Neck: Supple, no JVD Endocrine: No thryomegaly Cardiac: Normal S1, S2; RRR; no murmurs, rubs, or gallops Lungs: Clear to auscultation bilaterally, no wheezing,  rhonchi or rales  Abd: Soft, nontender, no hepatomegaly  Ext: No edema, pulses 2+ Musculoskeletal: No deformities, BUE and BLE strength normal and equal Skin: Warm and dry, no rashes   Neuro: Alert and oriented to person, place, time, and situation, CNII-XII grossly intact, no focal deficits  Psych: Normal mood and affect   ASSESSMENT:   Freddy Cruell is a 62 y.o. female who presents for the following: 1. Precordial pain   2. SOB (shortness of breath) on exertion   3. Palpitations     PLAN:   1. Precordial pain 2. SOB (shortness of breath) on exertion 3. Palpitations -She presents with chest discomfort shortness of breath and palpitations with activity.  She reports her heart races.  Recent Lexiscan nuclear medicine stress test was negative.  Still having exertional chest pressure.  I recommended coronary CTA for definitive evaluation.  A negative stress test can easily miss triple-vessel disease or significant blockages.  Her EKG today is nonischemic which is reassuring.  Symptoms do not sound classic for angina especially in the setting of negative troponins, normal stress test and normal EKG.  We will start with coronary CTA.  She had an echocardiogram that was normal.  CT PE study was negative at [facility] with no coronary calcium.  We will also check a TSH including free T4 and T3.  She will need a BMP.  We will exclude CHF with BNP.  Her examination is very normal today.      Disposition: Return if symptoms worsen or fail to improve.  Medication Adjustments/Labs and Tests Ordered: Current medicines are reviewed at length with the patient today.  Concerns regarding medicines are outlined above.  Orders Placed This Encounter  Procedures   CT CORONARY MORPH W/CTA COR W/SCORE  W/CA W/CM &/OR WO/CM   Basic metabolic panel   Brain natriuretic peptide   TSH   T4, free   T3   LONG TERM MONITOR (3-14 DAYS)   EKG 12-Lead   Meds ordered this encounter  Medications    predniSONE (DELTASONE) 50 MG tablet    Sig: Take 1 Tablet every 6 hours starting the night before the CT. Take last dose before you leave for the test.    Dispense:  3 tablet    Refill:  0   diphenhydrAMINE (BENADRYL) 50 MG tablet    Sig: Take with your last prednisone tablet  before leaving for test.    Dispense:  1 tablet    Refill:  0    Patient Instructions  Medication Instructions:  Take 2 tablets of your metoprolol the morning of your CT scan.   *If you need a refill on your cardiac medications before your next appointment, please call your pharmacy*   Lab Work: BMET, BNP today   If you have labs (blood work) drawn today and your tests are completely normal, you will receive your results only by: [place] (if you have MyChart) OR A paper copy in the mail If you have any lab test that is abnormal or we need to change your treatment, we will call you to review the results.   Testing/Procedures: Coronary CTA- they will call you to schedule.   ZIO XT- Long Term Monitor Instructions  Your physician has requested you wear a ZIO patch monitor for 7 days.  This is a single patch monitor. [employer] supplies one patch monitor per enrollment. Additional stickers are not available. Please do not apply patch if you will be having a Nuclear Stress Test,  Echocardiogram, Cardiac CT, MRI, or Chest Xray during the period you would be wearing the  monitor. The patch cannot be worn during these tests. You cannot remove and re-apply the  ZIO XT patch monitor.  Your ZIO patch monitor will be mailed 3 day USPS to your address on file. It may take 3-5 days  to receive your monitor after you have been enrolled.  Once you have received your monitor, please review the enclosed instructions. Your monitor  has already been registered assigning a specific monitor serial # to you.  Billing and Patient Assistance Program Information  We have supplied [employer] with any of your insurance  information on file for billing purposes. [employer] offers a sliding scale Patient Assistance Program for patients that do not have  insurance, or whose insurance does not completely cover the cost of the ZIO monitor.  You must apply for the Patient Assistance Program to qualify for this discounted rate.  To apply, please call [employer] at [phone], select option 4, select option 2, ask to apply for  Patient Assistance Program. Theodore Demark will ask your household income, and how many people  are in your household. They will quote your out-of-pocket cost based on that information.  [employer] will also be able to set up a 59-month interest-free payment plan if needed.  Applying the monitor   Shave hair from upper left chest.  Hold abrader disc by orange tab. Rub abrader in 40 strokes over the upper left chest as  indicated in your monitor instructions.  Clean area with 4 enclosed alcohol pads. Let dry.  Apply patch as indicated in monitor instructions. Patch will be placed under collarbone on left  side of chest with arrow pointing upward.  Rub patch adhesive wings for 2  minutes. Remove white label marked "1". Remove the white  label marked "2". Rub patch adhesive wings for 2 additional minutes.  While looking in a mirror, press and release button in center of patch. A small green light will  flash 3-4 times. This will be your only indicator that the monitor has been turned on.  Do not shower for the first 24 hours. You may shower after the first 24 hours.  Press the button if you feel a symptom. You will hear a small click. Record Date, Time and  Symptom in the Patient Logbook.  When you are ready to remove the patch, follow instructions on the last 2 pages of Patient  Logbook. Stick patch monitor onto the last page of Patient Logbook.  Place Patient Logbook in the blue and white box. Use locking tab on box and tape box closed  securely. The blue and white box has prepaid postage on it. Please  place it in the mailbox as  soon as possible. Your physician should have your test results approximately 7 days after the  monitor has been mailed back to [facility].  Call [place] at [phone] if you have questions regarding  your ZIO XT patch monitor. Call them immediately if you see an orange light blinking on your  monitor.  If your monitor falls off in less than 4 days, contact our Monitor department at [phone].  If your monitor becomes loose or falls off after 4 days call [employer] at [phone] for  suggestions on securing your monitor   Follow-Up: At [facility], you and your health needs are our priority.  As part of our continuing mission to provide you with exceptional heart care, we have created designated Provider Care Teams.  These Care Teams include your primary Cardiologist (physician) and Advanced Practice Providers (APPs -  Physician Assistants and Nurse Practitioners) who all work together to provide you with the care you need, when you need it.  We recommend signing up for the patient portal called "MyChart".  Sign up information is provided on this After Visit Summary.  MyChart is used to connect with patients for Virtual Visits (Telemedicine).  Patients are able to view lab/test results, encounter notes, upcoming appointments, etc.  Non-urgent messages can be sent to your provider as well.   To learn more about what you can do with MyChart, go to [email or web address].    Your next appointment:   As needed based on results.  Provider:   Eleonore Chiquito, MD   Other Instructions   Your cardiac CT will be scheduled at one of the below locations:   [facility] [address] [place], [place] [address] [phone]   If scheduled at [facility], please arrive at the [facility] and Children's Entrance (Entrance C2) of [facility] 30 minutes prior to test start time. You can use the FREE valet  parking offered at entrance C (encouraged to control the heart rate for the test)  Proceed to the [facility] Radiology Department (first floor) to check-in and test prep.  All radiology patients and guests should use entrance C2 at [facility], accessed from [facility], even though the hospital's physical address listed is [address].     Please follow these instructions carefully (unless otherwise directed):    On the Night Before the Test: Be sure to Drink plenty of water. Do not consume any caffeinated/decaffeinated beverages or chocolate 12 hours prior to your  test. Do not take any antihistamines 12 hours prior to your test. If the patient has contrast allergy: Patient will need a prescription for Prednisone and very clear instructions (as follows): Prednisone 50 mg - take 13 hours prior to test Take another Prednisone 50 mg 7 hours prior to test Take another Prednisone 50 mg 1 hour prior to test Take Benadryl 50 mg 1 hour prior to test Patient must complete all four doses of above prophylactic medications. Patient will need a ride after test due to Benadryl.  On the Day of the Test: Drink plenty of water until 1 hour prior to the test. Do not eat any food 1 hour prior to test. You may take your regular medications prior to the test.  Take 2 tablets of metoprolol the morning of the scan. If you take Furosemide/Hydrochlorothiazide/Spironolactone, please HOLD on the morning of the test. FEMALES- please wear underwire-free bra if available, avoid dresses & tight clothing      After the Test: Drink plenty of water. After receiving IV contrast, you may experience a mild flushed feeling. This is normal. On occasion, you may experience a mild rash up to 24 hours after the test. This is not dangerous. If this occurs, you can take Benadryl 25 mg and increase your fluid intake. If you experience trouble breathing, this can be serious. If it is severe  call 911 IMMEDIATELY. If it is mild, please call our office. If you take any of these medications: Glipizide/Metformin, Avandament, Glucavance, please do not take 48 hours after completing test unless otherwise instructed.  We will call to schedule your test 2-4 weeks out understanding that some insurance companies will need an authorization prior to the service being performed.   For non-scheduling related questions, please contact the cardiac imaging nurse navigator should you have any questions/concerns: Marchia Bond, Cardiac Imaging Nurse Navigator Gordy Clement, Cardiac Imaging Nurse Navigator [facility] Direct Office Dial: [phone]   For scheduling needs, including cancellations and rescheduling, please call [place], [phone].       Signed, Addison Naegeli. Audie Box, MD, [place]  [address], [place], [place] [address] [phone]  09/04/2022 2:46 PM

## 2022-09-01 DIAGNOSIS — R0789 Other chest pain: Secondary | ICD-10-CM | POA: Diagnosis not present

## 2022-09-01 DIAGNOSIS — R002 Palpitations: Secondary | ICD-10-CM | POA: Diagnosis not present

## 2022-09-01 DIAGNOSIS — R0602 Shortness of breath: Secondary | ICD-10-CM | POA: Diagnosis not present

## 2022-09-01 DIAGNOSIS — R42 Dizziness and giddiness: Secondary | ICD-10-CM | POA: Diagnosis not present

## 2022-09-01 DIAGNOSIS — E039 Hypothyroidism, unspecified: Secondary | ICD-10-CM | POA: Diagnosis not present

## 2022-09-02 ENCOUNTER — Other Ambulatory Visit: Payer: Self-pay | Admitting: Internal Medicine

## 2022-09-04 ENCOUNTER — Ambulatory Visit: Payer: PPO | Attending: Cardiology | Admitting: Cardiovascular Disease

## 2022-09-04 ENCOUNTER — Ambulatory Visit (INDEPENDENT_AMBULATORY_CARE_PROVIDER_SITE_OTHER): Payer: PPO

## 2022-09-04 ENCOUNTER — Other Ambulatory Visit: Payer: Self-pay | Admitting: Internal Medicine

## 2022-09-04 ENCOUNTER — Encounter: Payer: Self-pay | Admitting: Cardiovascular Disease

## 2022-09-04 VITALS — BP 118/82 | HR 54 | Ht 64.0 in | Wt 200.2 lb

## 2022-09-04 DIAGNOSIS — R072 Precordial pain: Secondary | ICD-10-CM

## 2022-09-04 DIAGNOSIS — R002 Palpitations: Secondary | ICD-10-CM | POA: Diagnosis not present

## 2022-09-04 DIAGNOSIS — R0602 Shortness of breath: Secondary | ICD-10-CM

## 2022-09-04 MED ORDER — DIPHENHYDRAMINE HCL 50 MG PO TABS: ORAL_TABLET | ORAL | 0 refills | Status: DC

## 2022-09-04 MED ORDER — PREDNISONE 50 MG PO TABS: ORAL_TABLET | ORAL | 0 refills | Status: DC

## 2022-09-04 MED ORDER — ZOLPIDEM TARTRATE 10 MG PO TABS: 10.0000 mg | ORAL_TABLET | Freq: Every evening | ORAL | 2 refills | Status: DC | PRN

## 2022-09-04 NOTE — Patient Instructions (Addendum)
Medication Instructions:  Take 2 tablets of your metoprolol the morning of your CT scan.   *If you need a refill on your cardiac medications before your next appointment, please call your pharmacy*   Lab Work: BMET, BNP today   If you have labs (blood work) drawn today and your tests are completely normal, you will receive your results only by: [place] (if you have MyChart) OR A paper copy in the mail If you have any lab test that is abnormal or we need to change your treatment, we will call you to review the results.   Testing/Procedures: Coronary CTA- they will call you to schedule.   ZIO XT- Long Term Monitor Instructions  Your physician has requested you wear a ZIO patch monitor for 7 days.  This is a single patch monitor. [employer] supplies one patch monitor per enrollment. Additional stickers are not available. Please do not apply patch if you will be having a Nuclear Stress Test,  Echocardiogram, Cardiac CT, MRI, or Chest Xray during the period you would be wearing the  monitor. The patch cannot be worn during these tests. You cannot remove and re-apply the  ZIO XT patch monitor.  Your ZIO patch monitor will be mailed 3 day USPS to your address on file. It may take 3-5 days  to receive your monitor after you have been enrolled.  Once you have received your monitor, please review the enclosed instructions. Your monitor  has already been registered assigning a specific monitor serial # to you.  Billing and Patient Assistance Program Information  We have supplied [employer] with any of your insurance information on file for billing purposes. [employer] offers a sliding scale Patient Assistance Program for patients that do not have  insurance, or whose insurance does not completely cover the cost of the ZIO monitor.  You must apply for the Patient Assistance Program to qualify for this discounted rate.  To apply, please call [employer] at [phone], select option 4, select  option 2, ask to apply for  Patient Assistance Program. Theodore Demark will ask your household income, and how many people  are in your household. They will quote your out-of-pocket cost based on that information.  [employer] will also be able to set up a 41-month interest-free payment plan if needed.  Applying the monitor   Shave hair from upper left chest.  Hold abrader disc by orange tab. Rub abrader in 40 strokes over the upper left chest as  indicated in your monitor instructions.  Clean area with 4 enclosed alcohol pads. Let dry.  Apply patch as indicated in monitor instructions. Patch will be placed under collarbone on left  side of chest with arrow pointing upward.  Rub patch adhesive wings for 2 minutes. Remove white label marked "1". Remove the white  label marked "2". Rub patch adhesive wings for 2 additional minutes.  While looking in a mirror, press and release button in center of patch. A small green light will  flash 3-4 times. This will be your only indicator that the monitor has been turned on.  Do not shower for the first 24 hours. You may shower after the first 24 hours.  Press the button if you feel a symptom. You will hear a small click. Record Date, Time and  Symptom in the Patient Logbook.  When you are ready to remove the patch, follow instructions on the last 2 pages of Patient  Logbook. Stick patch monitor onto the last page of Patient Logbook.  Place Patient Logbook in the blue and white box. Use locking tab on box and tape box closed  securely. The blue and white box has prepaid postage on it. Please place it in the mailbox as  soon as possible. Your physician should have your test results approximately 7 days after the  monitor has been mailed back to [facility].  Call [facility] at [phone] if you have questions regarding  your ZIO XT patch monitor. Call them immediately if you see an orange light blinking on your  monitor.  If your monitor  falls off in less than 4 days, contact our Monitor department at [phone].  If your monitor becomes loose or falls off after 4 days call [employer] at [phone] for  suggestions on securing your monitor   Follow-Up: At [facility], you and your health needs are our priority.  As part of our continuing mission to provide you with exceptional heart care, we have created designated Provider Care Teams.  These Care Teams include your primary Cardiologist (physician) and Advanced Practice Providers (APPs -  Physician Assistants and Nurse Practitioners) who all work together to provide you with the care you need, when you need it.  We recommend signing up for the patient portal called "MyChart".  Sign up information is provided on this After Visit Summary.  MyChart is used to connect with patients for Virtual Visits (Telemedicine).  Patients are able to view lab/test results, encounter notes, upcoming appointments, etc.  Non-urgent messages can be sent to your provider as well.   To learn more about what you can do with MyChart, go to [email or web address].    Your next appointment:   As needed based on results.  Provider:   Eleonore Chiquito, MD   Other Instructions   Your cardiac CT will be scheduled at one of the below locations:   [facility] [address] [place], [place] [address] [phone]   If scheduled at [facility], please arrive at the [facility] and Children's Entrance (Entrance C2) of [facility] 30 minutes prior to test start time. You can use the FREE valet parking offered at entrance C (encouraged to control the heart rate for the test)  Proceed to the [facility] Radiology Department (first floor) to check-in and test prep.  All radiology patients and guests should use entrance C2 at [facility], accessed from [facility], even though the hospital's physical address listed is [address].     Please follow these instructions carefully (unless otherwise directed):    On the Night Before the Test: Be sure to Drink plenty of water. Do not consume any caffeinated/decaffeinated beverages or chocolate 12 hours prior to your test. Do not take any antihistamines 12 hours prior to your test. If the patient has contrast allergy: Patient will need a prescription for Prednisone and very clear instructions (as follows): Prednisone 50 mg - take 13 hours prior to test Take another Prednisone 50 mg 7 hours prior to test Take another Prednisone 50 mg 1 hour prior to test Take Benadryl 50 mg 1 hour prior to test Patient must complete all four doses of above prophylactic medications. Patient will need a ride after test due to Benadryl.  On the Day of the Test: Drink plenty of water until 1 hour prior to the test. Do not eat any food 1 hour prior to test. You may take your regular medications prior to the test.  Take  2 tablets of metoprolol the morning of the scan. If you take Furosemide/Hydrochlorothiazide/Spironolactone, please HOLD on the morning of the test. FEMALES- please wear underwire-free bra if available, avoid dresses & tight clothing      After the Test: Drink plenty of water. After receiving IV contrast, you may experience a mild flushed feeling. This is normal. On occasion, you may experience a mild rash up to 24 hours after the test. This is not dangerous. If this occurs, you can take Benadryl 25 mg and increase your fluid intake. If you experience trouble breathing, this can be serious. If it is severe call 911 IMMEDIATELY. If it is mild, please call our office. If you take any of these medications: Glipizide/Metformin, Avandament, Glucavance, please do not take 48 hours after completing test unless otherwise instructed.  We will call to schedule your test 2-4 weeks out understanding that some insurance companies will need an authorization prior to the service  being performed.   For non-scheduling related questions, please contact the cardiac imaging nurse navigator should you have any questions/concerns: Marchia Bond, Cardiac Imaging Nurse Navigator Gordy Clement, Cardiac Imaging Nurse Navigator [facility] Direct Office Dial: [phone]   For scheduling needs, including cancellations and rescheduling, please call [place], [phone].

## 2022-09-04 NOTE — Progress Notes (Unsigned)
Enrolled patient for a 7 day Zio XT monitor to be mailed to patients home.  

## 2022-09-05 LAB — TSH: TSH: 1.55 u[IU]/mL (ref 0.450–4.500)

## 2022-09-05 LAB — BASIC METABOLIC PANEL
BUN/Creatinine Ratio: 18 (ref 9–23)
BUN: 14 mg/dL (ref 6–24)
CO2: 19 mmol/L — ABNORMAL LOW (ref 20–29)
Calcium: 9.3 mg/dL (ref 8.7–10.2)
Chloride: 103 mmol/L (ref 96–106)
Creatinine, Ser: 0.79 mg/dL (ref 0.57–1.00)
Glucose: 100 mg/dL — ABNORMAL HIGH (ref 70–99)
Potassium: 4 mmol/L (ref 3.5–5.2)
Sodium: 140 mmol/L (ref 134–144)
eGFR: 86 mL/min/{1.73_m2} (ref >59)

## 2022-09-05 LAB — BRAIN NATRIURETIC PEPTIDE: BNP: 12.9 pg/mL (ref 0.0–100.0)

## 2022-09-05 LAB — T4, FREE: Free T4: 1.35 ng/dL (ref 0.82–1.77)

## 2022-09-05 LAB — T3: T3, Total: 106 ng/dL (ref 71–180)

## 2022-09-09 DIAGNOSIS — R072 Precordial pain: Secondary | ICD-10-CM | POA: Diagnosis not present

## 2022-09-09 DIAGNOSIS — R002 Palpitations: Secondary | ICD-10-CM

## 2022-09-10 NOTE — Telephone Encounter (Signed)
Sent in by provider

## 2022-09-15 ENCOUNTER — Ambulatory Visit: Payer: PPO | Admitting: Cardiology

## 2022-09-17 ENCOUNTER — Telehealth (HOSPITAL_COMMUNITY): Payer: Self-pay | Admitting: Emergency Medicine

## 2022-09-17 NOTE — Telephone Encounter (Signed)
Reaching out to patient to offer assistance regarding upcoming cardiac imaging study; pt verbalizes understanding of appt date/time, parking situation and where to check in, pre-test NPO status and medications ordered, and verified current allergies; name and call back number provided for further questions should they arise Marchia Bond RN Navigator Cardiac Imaging [facility] [phone] office [phone] cell  Arrival 200 [address] entrance Reviewed 13 hr prep + daily meds Denies iv issues Aware contrast/nitro

## 2022-09-18 ENCOUNTER — Other Ambulatory Visit: Payer: Self-pay

## 2022-09-18 ENCOUNTER — Other Ambulatory Visit: Payer: Self-pay | Admitting: Internal Medicine

## 2022-09-18 MED ORDER — VALACYCLOVIR HCL 500 MG PO TABS: 500.0000 mg | ORAL_TABLET | Freq: Every day | ORAL | 1 refills | Status: DC | PRN

## 2022-09-21 ENCOUNTER — Ambulatory Visit (HOSPITAL_COMMUNITY)
Admission: RE | Admit: 2022-09-21 | Discharge: 2022-09-21 | Disposition: A | Discharge status: Home / Self Care | Payer: PPO | Source: Ambulatory Visit | Attending: Cardiovascular Disease | Admitting: Cardiovascular Disease

## 2022-09-21 DIAGNOSIS — R072 Precordial pain: Secondary | ICD-10-CM | POA: Diagnosis not present

## 2022-09-21 MED ORDER — NITROGLYCERIN 0.4 MG SL SUBL
0.8000 mg | SUBLINGUAL_TABLET | Freq: Once | SUBLINGUAL | Status: AC
  Administered 2022-09-21: 0.8 mg via SUBLINGUAL

## 2022-09-21 MED ORDER — IOHEXOL 350 MG/ML SOLN
95.0000 mL | Freq: Once | INTRAVENOUS | Status: AC | PRN
  Administered 2022-09-21: 95 mL via INTRAVENOUS

## 2022-09-21 MED ORDER — NITROGLYCERIN 0.4 MG SL SUBL
SUBLINGUAL_TABLET | SUBLINGUAL | Status: AC
  Filled 2022-09-21: qty 2

## 2022-09-22 DIAGNOSIS — R072 Precordial pain: Secondary | ICD-10-CM | POA: Diagnosis not present

## 2022-09-22 DIAGNOSIS — R002 Palpitations: Secondary | ICD-10-CM | POA: Diagnosis not present

## 2022-10-07 ENCOUNTER — Other Ambulatory Visit: Payer: Self-pay | Admitting: Internal Medicine

## 2022-10-15 NOTE — Progress Notes (Deleted)
   Cardiology Clinic Note   Date: 10/15/2022 ID: Morgan [name], DOB 1961/02/20, MRN [id number]  Primary Cardiologist:  Evalina Field, MD  Patient Profile    Morgan [name] is a 62 y.o. female who presents to the clinic today for follow-up.  Past medical history significant for: Hypertension. Hyperlipidemia. Palpitations. 7-day ZIO 09/22/2022: Brief SVT not associated with symptoms (2 episodes, longest 28 seconds).  Symptoms occurred with normal sinus rhythm and sinus tachycardia.  Rare ectopy. Precordial pain/DOE. Nuclear stress test 08/20/2022 (performed at [facility]): Normal study. Echo 09/01/2022 (performed at [facility]): EF 60 to 65%.  Mild aortic valve sclerosis without stenosis. Coronary CTA 09/21/2022: Calcium score 0.  Normal right dominant coronary arteries.  Extracardiac findings include hepatic steatosis.   History of Present Illness    Morgan [name] was first evaluated by Dr. Audie [name] on 09/04/2022 for chest pain at the request of Dr. Nona [name].  Patient had been seen at [facility] at the [facility] in February for chest pain where she had a normal stress test.  Patient reported episodes of tachycardia, shortness of breath, and chest pressure x 2 weeks.  Patient underwent further evaluation of chest pain with coronary CTA (detailed above) and ZIO monitoring for palpitations (detailed above).  Today, patient ***  Palpitations?  Beta-blocker?  Palpitations.  7-day ZIO March 2024 showed brief episodes of SVT.  Patient*** Precordial pain/DOE.  Normal nuclear stress test February 2024.  Echo February 2024 showed normal LV function with mild aortic valve sclerosis without stenosis.  Coronary CTA showed a calcium score of 0 and normal coronary arteries.  Patient*** Hypertension.  BP today*** Patient denies headaches or dizziness.  Continue amlodipine***   ROS: All other systems reviewed and are otherwise negative except as noted in History of Present Illness.  Studies  Reviewed    ECG personally reviewed by me today: ***  No significant changes from ***  Risk Assessment/Calculations    {Does this patient have ATRIAL FIBRILLATION?[phone]} No BP recorded.  {Refresh Note OR Click here to enter BP  :1}***        Physical Exam    VS:  There were no vitals taken for this visit. , BMI There is no height or weight on file to calculate BMI.  GEN: Well nourished, well developed, in no acute distress. Neck: No JVD or carotid bruits. Cardiac: *** RRR. No murmurs. No rubs or gallops.   Respiratory:  Respirations regular and unlabored. Clear to auscultation without rales, wheezing or rhonchi. GI: Soft, nontender, nondistended. Extremities: Radials/DP/PT 2+ and equal bilaterally. No clubbing or cyanosis. No edema ***  Skin: Warm and dry, no rash. Neuro: Strength intact.  Assessment & Plan   ***  Disposition: ***     {Are you ordering a CV Procedure (e.g. stress test, cath, DCCV, TEE, etc)?   Press F2        :[id number]   Signed, Justice Britain. Stepehn Eckard, DNP, NP-C

## 2022-10-16 ENCOUNTER — Ambulatory Visit: Payer: PPO | Admitting: Student

## 2022-10-26 ENCOUNTER — Ambulatory Visit: Payer: PPO | Admitting: Internal Medicine

## 2022-10-26 ENCOUNTER — Encounter: Payer: Self-pay | Admitting: Internal Medicine

## 2022-10-26 VITALS — BP 122/82 | HR 78 | Temp 97.9°F | Resp 16 | Ht 64.0 in | Wt 201.6 lb

## 2022-10-26 DIAGNOSIS — R0609 Other forms of dyspnea: Secondary | ICD-10-CM | POA: Insufficient documentation

## 2022-10-26 HISTORY — DX: Other forms of dyspnea: R06.09

## 2022-10-26 NOTE — Assessment & Plan Note (Signed)
I have reviewed her notes and studies from cardiology.  She is going to followup with cardiology.

## 2022-10-26 NOTE — Progress Notes (Signed)
Office Visit  Subjective   Patient ID: Morgan [name]   DOB: 1960/11/20   Age: 62 y.o.   MRN: [id number]   Chief Complaint Chief Complaint  Patient presents with   Follow-up    Hypertension     History of Present Illness Morgan [name] is a 62 yo female who comes in for followup of her SOB/chest pressure.  I saw her a few months ago where we ordered a CTA of her chest.  This showed no evidence of pulmonary embolism.  She did followup with cardiology who placed a zio patch on 09/22/2022 and this showed a predominance of sinus rhythm.  She also underwent a cardiac CT angiogram with calcium scoring on 09/24/2022 and this showed a calcium score of 0 and a normal right dominant arteries.  She has another appointment coming up with cardiology to followup on her DOE.  Again, she was she was admitted to [facility] from 2/8 until 2/9 where she was seen for SOB and chest pain.  She had serial cardiac enzymes that were negative and she has no evidence of EKG changes.  There was no SOB but she continued to have chest pain throughout her hospitalization.  The patient underwent nuclear stress testing where the vasodilator stress test actually made her pain worse however she showed no evidence of reversible ischemia and she had a preserved EF.  There was no evidence of arrythmia seen on telemetry.  They felt her chest pain was like due to musculoskeletal versus anxiety related.  She states that anytime she exerts herself, she will get substernal pressure which she states it goes away with rest.  It can take her 30 min for this pain to go away.  There is no associated SOB but she does get nauseated with it as well as palpitations as well as dizziness.     Past Medical History Past Medical History:  Diagnosis Date   Allergy    seasonal   Anemia    Anxiety    Arthritis    Asthma    Celiac disease    Depression    Fibromyalgia    Fibula fracture    GERD (gastroesophageal reflux disease)    Headache    HLD  (hyperlipidemia)    Hx of migraines    Hypertension    Hypothyroidism    Lupus    Myalgia and myositis, unspecified    Other B-complex deficiencies    PTSD (post-traumatic stress disorder)    Recurrent HSV (herpes simplex virus)    Thoracic outlet syndrome    Thyroiditis    Unspecified hemorrhoids without mention of complication    Varicose veins of lower extremities with other complications    Vertigo      Allergies Allergies  Allergen Reactions   Iodine Other (See Comments)    REACTION: SOB with injectable iodine   Iohexol Shortness Of Breath   Gabapentin    Pregabalin Other (See Comments)    Altered mental status     Medications  Current Outpatient Medications:    albuterol (PROVENTIL HFA;VENTOLIN HFA) 108 (90 BASE) MCG/ACT inhaler, Inhale 1-2 puffs into the lungs every 6 (six) hours as needed for wheezing or shortness of breath.  (Patient not taking: Reported on 09/04/2022), Disp: , Rfl:    amLODipine (NORVASC) 10 MG tablet, Take 10 mg by mouth daily., Disp: , Rfl:    Azelastine HCl 137 MCG/SPRAY SOLN, Place into the nose., Disp: , Rfl:    budesonide-formoterol (SYMBICORT) 160-4.5 MCG/ACT inhaler,  Inhale 2 puffs into the lungs at bedtime., Disp: , Rfl:    buPROPion (WELLBUTRIN XL) 300 MG 24 hr tablet, Take 1 tablet by mouth daily., Disp: , Rfl:    clobetasol ointment (TEMOVATE) 0.05 %, Apply 1 application topically 2 (two) times daily as needed. (Patient not taking: Reported on 09/04/2022), Disp: , Rfl:    clonazePAM (KLONOPIN) 0.5 MG tablet, Take 1 tablet (0.5 mg total) by mouth 2 (two) times daily., Disp: 30 tablet, Rfl: 2   Cyanocobalamin (B-12) 3000 MCG CAPS, Take by mouth., Disp: , Rfl:    dapsone 25 MG tablet, Take 25-100 mg by mouth daily as needed (for dermatitis). , Disp: , Rfl:    diphenhydrAMINE (BENADRYL) 50 MG tablet, Take with your last prednisone tablet  before leaving for test., Disp: 1 tablet, Rfl: 0   DULoxetine (CYMBALTA) 60 MG capsule, Take 60 mg by mouth  daily. Take with 20 mg to equal 80 mg daily. (Patient not taking: Reported on 09/04/2022), Disp: , Rfl:    DULoxetine (CYMBALTA) 60 MG capsule, Take 1 capsule by mouth daily., Disp: , Rfl:    esomeprazole (NEXIUM) 40 MG capsule, Take 40 mg by mouth daily at 12 noon., Disp: , Rfl:    famotidine (PEPCID) 10 MG tablet, Take 1 tablet by mouth at bedtime., Disp: , Rfl:    fluticasone (FLONASE) 50 MCG/ACT nasal spray, Place 1 spray into both nostrils daily., Disp: , Rfl:    hyoscyamine (LEVSIN SL) 0.125 MG SL tablet, Place 0.125 mg under the tongue every 4 (four) hours as needed for cramping. (Patient not taking: Reported on 09/04/2022), Disp: , Rfl:    ibuprofen (ADVIL,MOTRIN) 200 MG tablet, Take 800 mg by mouth 2 (two) times daily as needed for headache or moderate pain., Disp: , Rfl:    levothyroxine (SYNTHROID) 100 MCG tablet, TAKE ONE TABLET BY MOUTH monday-saturday before breakfast and TAKE TWO TABLETS BY MOUTH ONCE WEEKLY BEFORE BREAKFAST ON SUNDAYS, Disp: 90 tablet, Rfl: 1   Levothyroxine Sodium 100 MCG CAPS, Take 100 mcg by mouth daily. , Disp: , Rfl:    loratadine (CLARITIN) 10 MG tablet, Take 10 mg by mouth daily.  , Disp: , Rfl:    meclizine (ANTIVERT) 25 MG tablet, , Disp: , Rfl:    metoprolol tartrate (LOPRESSOR) 25 MG tablet, Take 25 mg by mouth 2 (two) times daily., Disp: , Rfl:    predniSONE (DELTASONE) 50 MG tablet, Take 1 Tablet every 6 hours starting the night before the CT. Take last dose before you leave for the test., Disp: 3 tablet, Rfl: 0   Rimegepant Sulfate (NURTEC) 75 MG TBDP, Take by mouth as needed. (Patient not taking: Reported on 09/04/2022), Disp: , Rfl:    rosuvastatin (CRESTOR) 10 MG tablet, Take 1 tablet (10 mg total) by mouth at bedtime., Disp: 90 tablet, Rfl: 1   Sod Fluoride-Potassium Nitrate (SODIUM FLUORIDE 5000 SENSITIVE) 1.1-5 % GEL, Take by mouth 2 (two) times daily., Disp: , Rfl:    valACYclovir (VALTREX) 1000 MG tablet, TAKE 1 TABLET(1000 MG) BY MOUTH EVERY DAY,  Disp: 30 tablet, Rfl: 1   valACYclovir (VALTREX) 500 MG tablet, Take 1-2 tablets (500-1,000 mg total) by mouth daily as needed (for outbreaks)., Disp: 30 tablet, Rfl: 1   zolpidem (AMBIEN) 10 MG tablet, Take 1 tablet (10 mg total) by mouth at bedtime as needed for sleep., Disp: 30 tablet, Rfl: 2   Review of Systems Review of Systems  Constitutional:  Negative for chills and fever.  Respiratory:  Negative for cough, shortness of breath and wheezing.   Cardiovascular:  Negative for chest pain, palpitations and leg swelling.  Gastrointestinal:  Negative for abdominal pain, constipation, diarrhea, nausea and vomiting.  Musculoskeletal:  Negative for myalgias.  Neurological:  Negative for dizziness, weakness and headaches.       Objective:    Vitals BP 122/82   Pulse 78   Temp 97.9 F (36.6 C)   Resp 16   Ht  (1.626 m)   Wt 201 lb 9.6 oz (91.4 kg)   SpO2 91%   BMI 34.60 kg/m    Physical Examination Physical Exam Constitutional:      Appearance: Normal appearance. She is not ill-appearing.  Cardiovascular:     Rate and Rhythm: Normal rate and regular rhythm.     Pulses: Normal pulses.     Heart sounds: No murmur heard.    No friction rub. No gallop.  Pulmonary:     Effort: Pulmonary effort is normal. No respiratory distress.     Breath sounds: No wheezing, rhonchi or rales.  Abdominal:     General: Bowel sounds are normal. There is no distension.     Palpations: Abdomen is soft.     Tenderness: There is no abdominal tenderness.  Musculoskeletal:     Right lower leg: No edema.     Left lower leg: No edema.  Skin:    General: Skin is warm and dry.     Findings: No rash.  Neurological:     Mental Status: She is alert.        Assessment & Plan:   DOE (dyspnea on exertion) I have reviewed her notes and studies from cardiology.  She is going to followup with cardiology.    Return in about 3 months (around 01/25/2023).   Crist Fat, MD

## 2022-10-30 ENCOUNTER — Ambulatory Visit: Payer: PPO

## 2022-10-30 NOTE — Progress Notes (Signed)
Initial Pharmacist Visit (CMCS) . Clinical Summary Next Pharmacist Follow Up: FPO in 1 week (eventually F/U every 4 months) Next AWV: to be scheduled Next PCP Visit: 01/22/23 Summary for PCP:  - Patient is on Amlodipine  daily for HTN, one SE of that medicine is tachycardia. Recommended cutting amlodipine in 1/2 ( to ) from today. - Recommended checking BP and HR twice a day for 1 week. I will call her on the 26th and get BP readings. Physician Referral Statement:: I reaffirm my previous referral of patient for CMCS services . Patient scheduled for CMCS visit with the clinical pharmacist: Patient is referred for [facility] by their PCP and Clinical Pharmacist is under general PCP supervision. Visit Type: Phone Call Date of Upcoming Visit: 08/14/2022 . Chronic Conditions Patient's Chronic Conditions: Hypertension (HTN), Hypothyroidism, Depression, Anxiety, Other, Asthma List Other Conditions (separated by comma): Fibromyalgia, Trigeminal Neuropathy, Chronic neurogenic cough. . Disease Assessments Visit Date Visit Completed on: 10/30/2022 Subjective Information Did patient bring medications to appointment?: Yes Subjective: She lives alone, ex-husband helps sometimes. Parents are elderly and live in town. She is dealing with Tachycardia, goes to Cardiology in May. She has DOE - dizziness, chest hurt, pounding heart. Checked heart and ruled out heart attack or blocked arteries. They think it is "sinus tachycardia" and is depressing as its limiting her activity.  Father has stage IV kidney failure. She has no children and has 2 older sisters (living in [place] and [place]). Sees her sister in [place] more frequently, [place] sister sometimes. She loves collecting gems and minerals - she reads up on them and collects/examining. She visits parents once a week. Also dealing with Insomnia for several months.  [facility] Ex-husband helps with lawn maintenance. Lifestyle  habits: Diet: Stopped coffee since HR is high (cut back on Caffeine), some housework. She is on strict gluten free diet (Celiac disease). Has Chicken breast, frozen vegetables or gluten free quick meals. Dairy is fine - does not like milk, uses butter, cheese, ice cream, yogurt (staple). Gluten free pretzels, nuts are the snacks. Sodas like Sprite, 3 of 16oz bottles.  Exercise: limited  Tobacco: None (due to asthma) Alcohol: quit  Sleep: She got COVID in November, since then she started having trouble sleeping. She takes muscle relaxer for spasms, and that didn't make her drowsy. She would just lay awake at night - in spite of going swimming and trying relaxation techniques. PCP said COVID brought on insomnia. Now, she is on sleep medicine - takes sometimes - after a few days of no sleep. Helps fall asleep but sleeps for 1.5hrs before awaking. Unable to take a daytime nap- feels drowsy/eyes go heavy but cannot fall asleep. What is the patient's sleep pattern?: Trouble staying asleep . SDOH: Accountable Health Communities Health-Related Social Needs Screening Tool ([employer]) SDOH questions were documented and reviewed (EMR or [employer]) within the past 12 months or since hospitalization?: No What is your living situation today? (ref #1): I have a steady place to live Think about the place you live. Do you have problems with any of the following? (ref #2): None of the above Within the past 12 months, you worried that your food would run out before you got money to buy more (ref #3): Never true Within the past 12 months, the food you bought just didn't last and you didn't have money to get more (ref #4): Never true In the past 12 months, has lack of reliable transportation kept  you from medical appointments, meetings, work or from getting things needed for daily living? (ref #5): No In the past 12 months, has the electric, gas, oil, or  water company threatened to shut off services in your home? (ref #6): No How often does anyone, including family and friends, physically hurt you? (ref #7): Never (1) How often does anyone, including family and friends, insult or talk down to you? (ref #8): Never (1) How often does anyone, including friends and family, threaten you with harm? (ref #9): Never (1) How often does anyone, including family and friends, scream or curse at you? (ref #10): Never (1) . Medication Adherence Does the HC have access to medication refill history?: No Name and location of Current pharmacy: [facility] / [facility] Current Rx insurance plan: HTA Are meds synced by current pharmacy?: Yes Are meds delivered by current pharmacy?: Yes - by local pharmacy Additional Info: Is having trouble with as needed meds from [facility], so uses [facility]. For Maintenance [facility] she likes [facility]. Assessment:: Adherent . Hypertension (HTN) Most Recent BP: 122/82 Most Recent HR: 78 taken on: 10/26/2022 Care Gap: Need BP documented or last BP 140/90 or higher: Addressed Assessed today?: Yes BP today is: 119/83 Heart Rate is: 83 (rest is <60) - 10/30/22 9.57AM.  Goal: <130/80 mmHG Is Patient checking BP at home?: Yes Has patient experienced hypotension, dizziness, falls or bradycardia?: Yes Details: She is currently dealing with tachycardia, DOE, Dizziness. We discussed: Proper Home BP Measurement, Contacting PCP office for signs and symptoms of high or low blood pressure (hypotension, dizziness, falls, headaches, edema) Assessment:: Uncontrolled Drug: Amlodipine 10mg  QD. Pharmacist Assessment: Appropriate, Effective, Query Safety Drug: Metoprolol Tartrate 25mg  BID. Pharmacist Assessment: Appropriate, Query Effectiveness Plan/Follow up: Recommend Modify: Amlodipine 5mg  (dose cut to half) daily. Order and Review:  BP and Pulse logs twice a day for 1 week (10/30/22) Counsel: Watch for - decrease  tachycardia episodes and if BP increases, it stays below 130/80 Anxiety Most Recent GAD-7 Score: No recent score. Assessed today?: No Drug: Clonazepam 0.5mg  BID. Marland Kitchen Depression Most recent PHQ-9 Score: No recent score. Assessed today?: Yes In your opinion, how do you feel your depression symptoms have been controlled over the past 3 months?: Stable / stayed the same Additional Info: - She was seeing a Tour manager at one time. We discussed: Other Details: Therapist to help with situational depression. Assessment:: Controlled Drug: Bupropion 300mg  QD. Pharmacist Assessment: Appropriate, Effective, Safe, Accessible Drug:  Duloxetine 60mg  "take 60mg  with 20mg  to equal 80mg  daily". Pharmacist Assessment: Appropriate, Effective, Safe, Accessible Plan/Follow up: Patient reports she feels well controlled. . Asthma Pre-bronchodilator FEV1: No scores on file. Post-bronchodilator FEV1: No scores on file. Most recent Hemoglobin: 11.8 taken on: 04/23/2022 Most recent Eosinophils: 0% taken on: 04/23/2022 Assessed today?: No Drug: Symbicort 160-4.44mcg/ACT 2 puffs QD. Drug: Azelastine 157mcg/spray.  Drug: Flonase 24mcg/ACT QD. Drug: Loratadine 10mg  QD. Drug: Albuterol 156mcg/ACT 1-2 puffs QID PRN. Marland Kitchen Hypothyroidism Most recent TSH: 1.35 taken on: 09/04/2022 Previous TSH: 1.660 taken on: 04/23/2022 Most recent T4: 1.35 taken on: 09/04/2022 Most recent T3: 106 taken on: 09/04/2022 Assessed today?: No Drug: Levothyroxine QD. General Assessed today?: No Preventative Health Care Gap: Colorectal cancer screening: Addressed Care Gap: Breast cancer screening: Needs to be addressed Care Gap: Annual Wellness Visit (AWV): Addressed Pharmacist Interventions Intervention Details Pharmacist Interventions discussed: Yes Modified Therapy: Dose related undesirable effect Monitoring: Routine monitoring. Lynann Bologna, PharmD Chart review Televisit 80 mins   Documentation 10  mins

## 2022-11-06 ENCOUNTER — Telehealth: Payer: Self-pay

## 2022-11-06 NOTE — Telephone Encounter (Signed)
Focused Pharmacist Outreach HTN and Tachycardia on Exertion Details of the Visit: She cut back on amlodipine to 1/2 tablet. She is still having symptoms of tachycardia on exertion. 10/30/22: 117/83 74 10/31/22 128/74 84   132/75 59 11/01/22  120/84 75    118/81 107 11/02/22  135/88  89    133/81  72 11/03/22 130/82 93      134/84  102 11/04/22  132/82  92    128/78  70 11/05/22 115/77  78      123/81  80 11/06/22 108/60  86  Cardiologist appt is middle of May She is other wise feeling ok , Recommended she continue amlodipine at 5mg  and keep track of her BP for another 2 weeks.  Plan :Call in 1 week on 11/13/22 for BP readings. Date of next Pharmacist Follow-up: 11/13/2022 Prev CMCS Pharmacist Note:  - Patient is on Amlodipine 10mg  daily for HTN, one SE of that medicine is tachycardia. Recommended cutting amlodipine in 1/2 ( to 5mg ) from today. - Recommended checking BP and HR twice a day for 1 week. I will call her on the 26th and get BP readings. Lynann Bologna ,PharmD  Call and documentation 

## 2022-11-15 NOTE — Progress Notes (Signed)
Cardiology Office Note:    Date:  11/16/2022   ID:  Morgan [name], DOB [birth year], MRN [id number]  PCP:  Crist Fat, MD   [facility] Providers Cardiologist:  Reatha Harps, MD     Referring MD: Crist Fat, MD   Chief Complaint  Patient presents with   Follow-up    History of Present Illness:    Morgan [name] is a 62 y.o. female with a hx of hypertension, GERD, thyroid dysfunction, fibromyalgia, hyperlipidemia, GAD, depression, fatigue, palpitations.  She was admitted to [facility] on 08/20/2022 until 08/21/2022 evaluated for shortness of breath and chest pain, troponin negative, EKG without changes, nuclear stress test showed no evidence of reversible ischemia with preserved EF.  Pain was felt to be likely due to MSK versus anxiety.  She establish care with Dr. Flora Lipps on 09/04/2022 for hospital follow-up.  She continued to have chest pain, as her recent stress test was negative the decision was made to obtain a coronary CTA for definitive evaluation.  Coronary CTA on 09/21/2022 revealed a calcium score of 0.  She wore a ZIO monitor around this time which revealed predominantly sinus bradycardia 53 bpm, 2 supraventricular tachycardia runs, symptoms occurred with normal sinus rhythm and sinus tachycardia.  She was most recently evaluated by her PCP on 10/26/2022, it appears she was doing well at this time.  She presents today a for follow-up of her hypertension and tachycardia.  She states that the symptoms started in January following a brief viral illness.  She then began to have episodes of near syncope, tachycardia, diaphoresis that could occur with physical activity or while resting.  Recently wore a ZIO monitor which revealed predominant rhythm was sinus bradycardia, with runs of SVT.  She denies orthostatic sensation/changes.  She checks her blood pressure regularly at home, her blood pressure readings have been around 100-110 systolic she denies chest pain,  dyspnea, pnd, orthopnea, n, v, dizziness, syncope, edema, weight gain, or early satiety.   Past Medical History:  Diagnosis Date   Allergy    seasonal   Anemia    Anxiety    Arthritis    Asthma    Celiac disease    Depression    Fibromyalgia    Fibula fracture    GERD (gastroesophageal reflux disease)    Headache    HLD (hyperlipidemia)    Hx of migraines    Hypertension    Hypothyroidism    Lupus (HCC)    Myalgia and myositis, unspecified    Other B-complex deficiencies    PTSD (post-traumatic stress disorder)    Recurrent HSV (herpes simplex virus)    Thoracic outlet syndrome    Thyroiditis    Unspecified hemorrhoids without mention of complication    Varicose veins of lower extremities with other complications    Vertigo     Past Surgical History:  Procedure Laterality Date   ABDOMINAL HYSTERECTOMY  1998   COLONOSCOPY  05/14/1999   biopsy negative   ESOPHAGOGASTRODUODENOSCOPY  07/13/2002   normal   FIBULA FRACTURE SURGERY     MANDIBLE SURGERY     NASAL SINUS SURGERY     OTHER SURGICAL HISTORY     laser surgery for endometriosis   TOTAL VAGINAL HYSTERECTOMY  04/12/1997   VENOGRAM N/A 03/06/2014   Procedure: VENOGRAM;  Surgeon: Fransisco Hertz, MD;  Location: [facility] CATH LAB;  Service: Cardiovascular;  Laterality: N/A;    Current Medications: Current Meds  Medication Sig   acetaminophen (TYLENOL)  500 MG tablet Take 500 mg by mouth every 6 (six) hours as needed.   amLODipine (NORVASC) 5 MG tablet Take 5 mg by mouth daily.   budesonide-formoterol (SYMBICORT) 160-4.5 MCG/ACT inhaler Inhale 2 puffs into the lungs at bedtime.   buPROPion (WELLBUTRIN XL) 300 MG 24 hr tablet Take 1 tablet by mouth daily.   carisoprodol (SOMA) 350 MG tablet Take 350 mg by mouth 3 (three) times daily as needed for muscle spasms.   clobetasol ointment (TEMOVATE) 0.05 % Apply 1 application  topically 2 (two) times daily as needed.   clonazePAM (KLONOPIN) 0.5 MG tablet Take 0.5 mg by mouth  Once PRN for anxiety.   Cyanocobalamin (B-12) 3000 MCG CAPS Take 1 capsule by mouth daily.   dapsone 25 MG tablet Take 25-100 mg by mouth daily as needed (for dermatitis).    DULoxetine (CYMBALTA) 60 MG capsule Take 1 capsule by mouth daily.   esomeprazole (NEXIUM) 40 MG capsule Take 40 mg by mouth daily at 12 noon.   famotidine (PEPCID) 10 MG tablet Take 1 tablet by mouth at bedtime.   fluticasone (FLONASE) 50 MCG/ACT nasal spray Place 1 spray into both nostrils daily.   ibuprofen (ADVIL,MOTRIN) 200 MG tablet Take 800 mg by mouth 2 (two) times daily as needed for headache or moderate pain.   levothyroxine (SYNTHROID) 100 MCG tablet TAKE ONE TABLET BY MOUTH monday-saturday before breakfast and TAKE TWO TABLETS BY MOUTH ONCE WEEKLY BEFORE BREAKFAST ON SUNDAYS   loratadine (CLARITIN) 10 MG tablet Take 10 mg by mouth daily.     metoprolol tartrate (LOPRESSOR) 25 MG tablet Take 25 mg by mouth 2 (two) times daily.   rosuvastatin (CRESTOR) 10 MG tablet Take 1 tablet (10 mg total) by mouth at bedtime.   Sod Fluoride-Potassium Nitrate (SODIUM FLUORIDE 5000 SENSITIVE) 1.1-5 % GEL Take by mouth 2 (two) times daily.   valACYclovir (VALTREX) 500 MG tablet Take 1-2 tablets (500-1,000 mg total) by mouth daily as needed (for outbreaks).   zolpidem (AMBIEN) 10 MG tablet Take 1 tablet (10 mg total) by mouth at bedtime as needed for sleep.     Allergies:   Iodine, Iohexol, Gabapentin, and Pregabalin   Social History   Socioeconomic History   Marital status: Divorced    Spouse name: Not on file   Number of children: 0   Years of education: Not on file   Highest education level: Not on file  Occupational History   Occupation: Radiographer, therapeutic: [employer]   Occupation: Disability  Tobacco Use   Smoking status: Never   Smokeless tobacco: Never  Substance and Sexual Activity   Alcohol use: Yes    Comment: Very rare   Drug use: No   Sexual activity: Not on file  Other Topics Concern   Not on  file  Social History Narrative   Divorced    No children    Living in [place] (12/10)   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  ROS:   Please see the history of present illness.    All other systems reviewed and are negative.  EKGs/Labs/Other Studies Reviewed:    The following studies were reviewed today: Cardiac Studies & Procedures     STRESS TESTS  MYOCARDIAL PERFUSION IMAGING 09/08/2022     MONITORS  LONG TERM MONITOR (3-14 DAYS) 09/22/2022  Narrative Patch Wear Time:  6 days and 12 hours (2024-02-28T08:34:43-0500 to 2024-03-05T20:43:51-0500)  Patient had a min HR of 53 bpm (sinus bradycardia), max HR of 128 bpm (sinus tachycardia), and avg HR of 74 bpm (normal sinus rhythm). Predominant underlying rhythm was Sinus Rhythm. 2 Supraventricular tachycardia runs occurred, the run with the fastest interval lasting 28.7 secs with a max rate of 115 bpm (avg 104 bpm); the run with the fastest interval was also the longest. Isolated SVEs were rare (<1.0%), SVE Couplets were rare (<1.0%), and SVE Triplets were rare (<1.0%). Isolated VEs were rare (<1.0%), and no VE Couplets or VE Triplets were present. Ventricular Bigeminy and Trigeminy were present.  Impression: Brief SVT detected not associated with symptoms (2 episodes in 7 days; longest duration 28 seconds). Symptoms occurred with normal sinus rhythm and  sinus tachycardia. Rare ectopy.  Gerri Spore T. Flora Lipps, MD, [facility]  [facility] [address] [place], [place] [address] [phone] 9:53 PM   CT SCANS  CT CORONARY MORPH W/CTA COR W/SCORE 09/24/2022  Addendum 09/24/2022 12:45 AM ADDENDUM REPORT: 09/24/2022 00:43  EXAM: OVER-READ INTERPRETATION  CT CHEST  The following report is an over-read performed by radiologist Dr. Donetta Potts [facility] on 09/24/2022. This over-read does not include interpretation of cardiac or coronary anatomy or pathology. The cardiac interpretation by the cardiologist is attached.  COMPARISON:  08/31/2022.  FINDINGS: Heart is normal in size and there is no pericardial effusion. The aorta and pulmonary trunk are normal in caliber. No mediastinal or hilar lymphadenopathy. The esophagus is within normal limits.  The visualized lungs are clear.  No effusion or pneumothorax.  Fatty infiltration of the liver is noted.  Degenerative changes are present in the thoracic spine. Old rib fractures noted on the left. No acute or suspicious osseous abnormality.  IMPRESSION: 1. No acute abnormality. 2. Hepatic steatosis.   Electronically Signed By: Thornell Sartorius M.D. On: 09/24/2022 00:43  Narrative CLINICAL DATA:  Chest pain  EXAM: Cardiac CTA  MEDICATIONS: Sub lingual nitro. 4mg  and lopressor 25mg   13 hour contrast allergy prep with prednisone and benadryl  TECHNIQUE: The patient was scanned on a Siemens Force 192 slice scanner. Gantry rotation speed was 250 msecs. Collimation was .6 mm. A 90 kV prospective scan was triggered in the ascending thoracic aorta at 140 HU's Full mA was used between 35% and 75% of the R-R interval. Average HR during the scan was 68 bpm. The 3D data set was interpreted on a dedicated work station using MPR, MIP and VRT modes. A total of 80 cc of contrast was used.  FINDINGS: Non-cardiac: See separate report from [facility]  Radiology. No significant findings on limited lung and soft tissue windows.  Calcium Score: No calcium noted  Coronary Arteries: Right dominant with no anomalies  LM: Normal  LAD: Normal tortuous mid vessel but no obvious bridging on cine images  D1: Normal  D2: Normal  Circumflex: Normal  OM1: Normal  AV groove: Normal  RCA: Normal  PDA: Normal  PLA: Normal  IMPRESSION: 1.  Calcium score 0  2.  Normal ascending thoracic aorta 3.4 cm  3.  Normal right dominant coronary arteries  Charlton Haws  Electronically Signed: By: Charlton Haws M.D. On: 09/21/2022 15:10           EKG:  EKG is not ordered today.    Recent Labs: 09/04/2022: BNP 12.9; BUN 14; Creatinine, Ser 0.79; Potassium 4.0; Sodium 140; TSH 1.550  Recent Lipid Panel    Component Value Date/Time   CHOL 174 08/04/2011 0500   TRIG 54.0 08/04/2011 0500   HDL 39.00 (L) 08/04/2011 0500   CHOLHDL 4 08/04/2011 0500   VLDL 10.8 08/04/2011 0500   LDLCALC 124 (H) 08/04/2011 0500     Risk Assessment/Calculations:                Physical Exam:    VS:  BP 98/80 (BP Location: Right Arm, Patient Position: Sitting, Cuff Size: Normal)   Pulse 70   Ht 5\' 4"  (1.626 m)   Wt 199 lb (90.3 kg)   SpO2 96%   BMI 34.16 kg/m     Wt Readings from Last 3 Encounters:  11/16/22 199 lb (90.3 kg)  10/26/22 201 lb 9.6 oz (91.4 kg)  09/04/22 200 lb 3.2 oz (90.8 kg)     GEN:  Well nourished, well developed in no acute distress HEENT: Normal NECK: No JVD; No carotid bruits LYMPHATICS: No lymphadenopathy CARDIAC: regular rhythm but slow, no murmurs, rubs, gallops RESPIRATORY:  Clear to auscultation without rales, wheezing or rhonchi  ABDOMEN: Soft, non-tender, non-distended MUSCULOSKELETAL:  No edema; No deformity  SKIN: Warm and dry NEUROLOGIC:  Alert and oriented x 3 PSYCHIATRIC:  Normal affect   ASSESSMENT:    1. Inappropriate sinus tachycardia   2. Precordial pain   3. Palpitations   4. SOB  (shortness of breath) on exertion   5. Essential hypertension   6. Near syncope    PLAN:    In order of problems listed above:  Inappropriate sinus tachycardia -she has been checking her blood pressure and heart rate several times throughout the day, she reports episodes of her heart racing into the 100-110 range, this can be accompanied by near syncope if she is exerting herself.  Denies orthostasis.  We discussed triggers, including avoiding hot showers, avoiding caffeine--which she was already doing.  We discussed increasing her oral hydration to a minimum of 64 ounces of fluid per day, increasing her sodium intake to see if this will help alleviate some of her symptoms, as well as compression socks.  We discussed when her symptoms start to immediately lay down until symptoms subside. Hypertension-her amlodipine was recently decreased to 5 mg daily approximately 3 weeks ago, blood pressure in the office today is 98/80, I want her to hold her amlodipine at this time.  She will continue to take metoprolol 25 mg twice daily.  She will continue to check her blood pressure daily with instructions to notify me via MyChart if her systolic blood pressure remains greater than 130. Precordial pain-she has had no recurrence of chest pain, recent coronary CTA revealed a calcium score of 0.  This is provided some reassurance for her. Palpitations-what she describes seems to be most associated with tachycardic episodes.  Continue metoprolol 25 mg twice daily.  She has been evaluated thoroughly from a cardiac perspective as well by her PCP without any findings that are attributed to this.  Disposition-hold amlodipine, increase water intake, increase sodium intake,  wear compression socks.  Return in 3 months, sooner if needed.           Medication Adjustments/Labs and Tests Ordered: Current medicines are reviewed at length with the patient today.  Concerns regarding medicines are outlined above.  No orders  of the defined types were placed in this encounter.  No orders of the defined types were placed in this encounter.   Patient Instructions  Medication Instructions:  Your physician has recommended you make the following change in your medication:  Hold Amlodipine Double your water intake, add salt to food Contact by MyChart if BP is greater than 130  *If you need a refill on your cardiac medications before your next appointment, please call your pharmacy*   Lab Work: NONE If you have labs (blood work) drawn today and your tests are completely normal, you will receive your results only by: MyChart Message (if you have MyChart) OR A paper copy in the mail If you have any lab test that is abnormal or we need to change your treatment, we will call you to review the results.   Testing/Procedures: NONE   Follow-Up: At [facility], you and your health needs are our priority.  As part of our continuing mission to provide you with exceptional heart care, we have created designated Provider Care Teams.  These Care Teams include your primary Cardiologist (physician) and Advanced Practice Providers (APPs -  Physician Assistants and Nurse Practitioners) who all work together to provide you with the care you need, when you need it.  We recommend signing up for the patient portal called "MyChart".  Sign up information is provided on this After Visit Summary.  MyChart is used to connect with patients for Virtual Visits (Telemedicine).  Patients are able to view lab/test results, encounter notes, upcoming appointments, etc.  Non-urgent messages can be sent to your provider as well.   To learn more about what you can do with MyChart, go to [email or web address].    Your next appointment:   3 month(s)  Provider:   Gypsy Balsam, MD    Other Instructions     Signed, Flossie Dibble, NP  11/16/2022 3:55 PM    [facility]

## 2022-11-16 ENCOUNTER — Encounter: Payer: Self-pay | Admitting: Cardiology

## 2022-11-16 ENCOUNTER — Ambulatory Visit: Payer: PPO | Attending: Student | Admitting: Cardiology

## 2022-11-16 VITALS — BP 98/80 | HR 70 | Ht 64.0 in | Wt 199.0 lb

## 2022-11-16 DIAGNOSIS — R55 Syncope and collapse: Secondary | ICD-10-CM

## 2022-11-16 DIAGNOSIS — R072 Precordial pain: Secondary | ICD-10-CM | POA: Diagnosis not present

## 2022-11-16 DIAGNOSIS — I10 Essential (primary) hypertension: Secondary | ICD-10-CM

## 2022-11-16 DIAGNOSIS — R0602 Shortness of breath: Secondary | ICD-10-CM

## 2022-11-16 DIAGNOSIS — R002 Palpitations: Secondary | ICD-10-CM

## 2022-11-16 DIAGNOSIS — I4711 Inappropriate sinus tachycardia, so stated: Secondary | ICD-10-CM

## 2022-11-16 NOTE — Patient Instructions (Signed)
Medication Instructions:  Your physician has recommended you make the following change in your medication:  Hold Amlodipine Double your water intake, add salt to food Contact by MyChart if BP is greater than 130  *If you need a refill on your cardiac medications before your next appointment, please call your pharmacy*   Lab Work: NONE If you have labs (blood work) drawn today and your tests are completely normal, you will receive your results only by: MyChart Message (if you have MyChart) OR A paper copy in the mail If you have any lab test that is abnormal or we need to change your treatment, we will call you to review the results.   Testing/Procedures: NONE   Follow-Up: At [facility], you and your health needs are our priority.  As part of our continuing mission to provide you with exceptional heart care, we have created designated Provider Care Teams.  These Care Teams include your primary Cardiologist (physician) and Advanced Practice Providers (APPs -  Physician Assistants and Nurse Practitioners) who all work together to provide you with the care you need, when you need it.  We recommend signing up for the patient portal called "MyChart".  Sign up information is provided on this After Visit Summary.  MyChart is used to connect with patients for Virtual Visits (Telemedicine).  Patients are able to view lab/test results, encounter notes, upcoming appointments, etc.  Non-urgent messages can be sent to your provider as well.   To learn more about what you can do with MyChart, go to [email or web address].    Your next appointment:   3 month(s)  Provider:   Gypsy Balsam, MD    Other Instructions

## 2022-12-01 DIAGNOSIS — D225 Melanocytic nevi of trunk: Secondary | ICD-10-CM | POA: Diagnosis not present

## 2022-12-01 DIAGNOSIS — L130 Dermatitis herpetiformis: Secondary | ICD-10-CM | POA: Diagnosis not present

## 2022-12-14 ENCOUNTER — Other Ambulatory Visit: Payer: Self-pay | Admitting: Internal Medicine

## 2022-12-16 ENCOUNTER — Other Ambulatory Visit: Payer: Self-pay | Admitting: Internal Medicine

## 2022-12-16 MED ORDER — CARISOPRODOL 350 MG PO TABS: 350.0000 mg | ORAL_TABLET | Freq: Three times a day (TID) | ORAL | 1 refills | Status: DC | PRN

## 2022-12-16 MED ORDER — CLONAZEPAM 0.5 MG PO TABS: 0.5000 mg | ORAL_TABLET | Freq: Once | ORAL | 2 refills | Status: DC | PRN

## 2022-12-18 NOTE — Telephone Encounter (Signed)
Already sent in

## 2023-01-05 ENCOUNTER — Other Ambulatory Visit: Payer: Self-pay | Admitting: Internal Medicine

## 2023-01-08 DIAGNOSIS — R0600 Dyspnea, unspecified: Secondary | ICD-10-CM | POA: Diagnosis not present

## 2023-01-08 DIAGNOSIS — J441 Chronic obstructive pulmonary disease with (acute) exacerbation: Secondary | ICD-10-CM | POA: Diagnosis not present

## 2023-01-08 DIAGNOSIS — E7800 Pure hypercholesterolemia, unspecified: Secondary | ICD-10-CM | POA: Diagnosis not present

## 2023-01-08 DIAGNOSIS — F419 Anxiety disorder, unspecified: Secondary | ICD-10-CM | POA: Diagnosis not present

## 2023-01-08 DIAGNOSIS — Z87440 Personal history of urinary (tract) infections: Secondary | ICD-10-CM | POA: Diagnosis not present

## 2023-01-08 DIAGNOSIS — Z7952 Long term (current) use of systemic steroids: Secondary | ICD-10-CM | POA: Diagnosis not present

## 2023-01-08 DIAGNOSIS — Z91041 Radiographic dye allergy status: Secondary | ICD-10-CM | POA: Diagnosis not present

## 2023-01-08 DIAGNOSIS — F32A Depression, unspecified: Secondary | ICD-10-CM | POA: Diagnosis not present

## 2023-01-08 DIAGNOSIS — Z7951 Long term (current) use of inhaled steroids: Secondary | ICD-10-CM | POA: Diagnosis not present

## 2023-01-08 DIAGNOSIS — E039 Hypothyroidism, unspecified: Secondary | ICD-10-CM | POA: Diagnosis not present

## 2023-01-08 DIAGNOSIS — Z792 Long term (current) use of antibiotics: Secondary | ICD-10-CM | POA: Diagnosis not present

## 2023-01-08 DIAGNOSIS — Z6834 Body mass index (BMI) 34.0-34.9, adult: Secondary | ICD-10-CM | POA: Diagnosis not present

## 2023-01-08 DIAGNOSIS — R0789 Other chest pain: Secondary | ICD-10-CM | POA: Diagnosis not present

## 2023-01-08 DIAGNOSIS — R0602 Shortness of breath: Secondary | ICD-10-CM | POA: Diagnosis not present

## 2023-01-08 DIAGNOSIS — R0902 Hypoxemia: Secondary | ICD-10-CM | POA: Diagnosis not present

## 2023-01-08 DIAGNOSIS — L130 Dermatitis herpetiformis: Secondary | ICD-10-CM | POA: Diagnosis not present

## 2023-01-08 DIAGNOSIS — R079 Chest pain, unspecified: Secondary | ICD-10-CM | POA: Diagnosis not present

## 2023-01-08 DIAGNOSIS — D649 Anemia, unspecified: Secondary | ICD-10-CM | POA: Diagnosis not present

## 2023-01-08 DIAGNOSIS — Z79899 Other long term (current) drug therapy: Secondary | ICD-10-CM | POA: Diagnosis not present

## 2023-01-08 DIAGNOSIS — E669 Obesity, unspecified: Secondary | ICD-10-CM | POA: Diagnosis not present

## 2023-01-08 DIAGNOSIS — K900 Celiac disease: Secondary | ICD-10-CM | POA: Diagnosis not present

## 2023-01-08 DIAGNOSIS — I10 Essential (primary) hypertension: Secondary | ICD-10-CM | POA: Diagnosis not present

## 2023-01-08 DIAGNOSIS — G540 Brachial plexus disorders: Secondary | ICD-10-CM | POA: Diagnosis not present

## 2023-01-08 DIAGNOSIS — M1990 Unspecified osteoarthritis, unspecified site: Secondary | ICD-10-CM | POA: Diagnosis not present

## 2023-01-11 DIAGNOSIS — R0902 Hypoxemia: Secondary | ICD-10-CM | POA: Diagnosis not present

## 2023-01-11 DIAGNOSIS — G540 Brachial plexus disorders: Secondary | ICD-10-CM | POA: Diagnosis not present

## 2023-01-11 DIAGNOSIS — R42 Dizziness and giddiness: Secondary | ICD-10-CM | POA: Diagnosis not present

## 2023-01-11 DIAGNOSIS — J441 Chronic obstructive pulmonary disease with (acute) exacerbation: Secondary | ICD-10-CM | POA: Diagnosis not present

## 2023-01-20 ENCOUNTER — Encounter: Payer: Self-pay | Admitting: Internal Medicine

## 2023-01-20 ENCOUNTER — Ambulatory Visit: Payer: PPO | Admitting: Internal Medicine

## 2023-01-20 VITALS — BP 110/80 | HR 82 | Temp 97.8°F | Resp 18 | Ht 64.0 in | Wt 195.0 lb

## 2023-01-20 DIAGNOSIS — R0902 Hypoxemia: Secondary | ICD-10-CM

## 2023-01-20 HISTORY — DX: Hypoxemia: R09.02

## 2023-01-20 NOTE — Assessment & Plan Note (Signed)
She is not having an ashtma or COPD excerbation today.  She states her breathing has improved.  Her oxygen sats at rest are 97% on 2L.  I took her oxygen off and she was satting at 96%.  We had her ambulate on RA and she maintained at 97% on RA.  Her HR went up to 107.  We saw a lot of this tachycardia after COVID a few years ago.  She has seen cardiology and will continue this plan.  I told her she could come off the oxygen.

## 2023-01-20 NOTE — Progress Notes (Signed)
Office Visit  Subjective   Patient ID: Morgan [name]   DOB: Feb 21, 1961   Age: 62 y.o.   MRN: [id number]   Chief Complaint Chief Complaint  Patient presents with   hosital follow up    Hospital follow up     History of Present Illness Morgan [name] is a 62 yo female who comes in today for a hospital followup where she was admitted to [facility] from 01/08/2023 until 01/09/2023.  She began breaking out with herpatiformis dermatitis where this has been associated with her celiac disease.  She began taking dapsone on her own accord.  The patient began having increased SOB and pain radiation down her left arm for a few days before admission.  She went to the urgent care but was found hypoxic with oxygen sats at home of 83%.  They sent her to the ER where they did a CXR that showed no active cardiopulmonary disease.  They also did a CTA of the chest that showed no acute cardiopulmonary disease and no evidence of PE.  They noted she had some tachycardia but she had told them she has positional tachycardia with heart rate in the 140's with just simple walking as an outpatient since 08/2022.  She was admitted to [facility] with acute COPD exacerbation with hypoxia.  She was placed on oxygen.  They noted she had no wheezing and did not feel her hypoxia was related to her asthma.  She tells me that she has had problems with anemia with dapsone in the past.  They noted she had anemia with a HgB 10.6 and they did iron studies where her iron was 52 and her ferritin was 19 with a reticulocyte count at 4%.  She was placed on iron and was sent home prednisone and oxygen via [place].  Today, she has not had any other outbreaks of dermatitis herpatiformis and she has not taken anymore dapsone.  She states her breathing has been doing ok.  She is currently on 2L of oxygen via [place].  She denies any fevers, chills, wheezing or other problems.       Past Medical History Past Medical History:  Diagnosis Date   Allergy    seasonal   Anemia     Anxiety    Arthritis    Asthma    Celiac disease    Depression    Fibromyalgia    Fibula fracture    GERD (gastroesophageal reflux disease)    Headache    HLD (hyperlipidemia)    Hx of migraines    Hypertension    Hypothyroidism    Lupus (HCC)    Myalgia and myositis, unspecified    Other B-complex deficiencies    PTSD (post-traumatic stress disorder)    Recurrent HSV (herpes simplex virus)    Thoracic outlet syndrome    Thyroiditis    Unspecified hemorrhoids without mention of complication    Varicose veins of lower extremities with other complications    Vertigo      Allergies Allergies  Allergen Reactions   Iodine Other (See Comments)    REACTION: SOB with injectable iodine   Iohexol Shortness Of Breath   Gabapentin    Pregabalin Other (See Comments)    Altered mental status     Medications  Current Outpatient Medications:    acetaminophen (TYLENOL) 500 MG tablet, Take 500 mg by mouth every 6 (six) hours as needed., Disp: , Rfl:    amLODipine (NORVASC) 10 MG tablet, TAKE ONE TABLET BY MOUTH  EVERYDAY AT BEDTIME, Disp: 30 tablet, Rfl: 1   amLODipine (NORVASC) 5 MG tablet, Take 5 mg by mouth daily., Disp: , Rfl:    budesonide-formoterol (SYMBICORT) 160-4.5 MCG/ACT inhaler, Inhale 2 puffs into the lungs at bedtime., Disp: , Rfl:    buPROPion (WELLBUTRIN XL) 300 MG 24 hr tablet, TAKE ONE TABLET BY MOUTH EVERY MORNING, Disp: 90 tablet, Rfl: 1   carisoprodol (SOMA) 350 MG tablet, Take 1 tablet (350 mg total) by mouth 3 (three) times daily as needed for muscle spasms., Disp: 90 tablet, Rfl: 1   clobetasol ointment (TEMOVATE) 0.05 %, Apply 1 application  topically 2 (two) times daily as needed., Disp: , Rfl:    clonazePAM (KLONOPIN) 0.5 MG tablet, Take 1 tablet (0.5 mg total) by mouth Once PRN for anxiety., Disp: 30 tablet, Rfl: 2   Cyanocobalamin (B-12) 3000 MCG CAPS, Take 1 capsule by mouth daily., Disp: , Rfl:    dapsone 25 MG tablet, Take 25-100 mg by mouth daily as  needed (for dermatitis). , Disp: , Rfl:    DULoxetine (CYMBALTA) 60 MG capsule, TAKE ONE CAPSULE BY MOUTH EVERY EVENING, Disp: 90 capsule, Rfl: 1   esomeprazole (NEXIUM) 40 MG capsule, Take 40 mg by mouth daily at 12 noon., Disp: , Rfl:    famotidine (PEPCID) 10 MG tablet, Take 1 tablet by mouth at bedtime., Disp: , Rfl:    fluticasone (FLONASE) 50 MCG/ACT nasal spray, Place 1 spray into both nostrils daily., Disp: , Rfl:    ibuprofen (ADVIL,MOTRIN) 200 MG tablet, Take 800 mg by mouth 2 (two) times daily as needed for headache or moderate pain., Disp: , Rfl:    levothyroxine (SYNTHROID) 100 MCG tablet, TAKE ONE TABLET BY MOUTH monday-saturday before breakfast and TAKE TWO TABLETS BY MOUTH ONCE WEEKLY BEFORE BREAKFAST ON SUNDAYS, Disp: 90 tablet, Rfl: 1   loratadine (CLARITIN) 10 MG tablet, Take 10 mg by mouth daily.  , Disp: , Rfl:    metoprolol tartrate (LOPRESSOR) 25 MG tablet, TAKE ONE TABLET BY MOUTH TWICE DAILY, Disp: 180 tablet, Rfl: 1   rosuvastatin (CRESTOR) 10 MG tablet, TAKE ONE TABLET BY MOUTH EVERYDAY AT BEDTIME, Disp: 90 tablet, Rfl: 1   Sod Fluoride-Potassium Nitrate (SODIUM FLUORIDE 5000 SENSITIVE) 1.1-5 % GEL, Take by mouth 2 (two) times daily., Disp: , Rfl:    valACYclovir (VALTREX) 500 MG tablet, Take 1-2 tablets (500-1,000 mg total) by mouth daily as needed (for outbreaks)., Disp: 30 tablet, Rfl: 1   zolpidem (AMBIEN) 10 MG tablet, Take 1 tablet (10 mg total) by mouth at bedtime as needed for sleep., Disp: 30 tablet, Rfl: 2   Review of Systems Review of Systems  Constitutional:  Negative for chills and fever.  Eyes:  Negative for blurred vision and double vision.  Respiratory:  Negative for cough, shortness of breath and wheezing.   Cardiovascular:  Negative for chest pain, palpitations and leg swelling.  Gastrointestinal:  Positive for constipation. Negative for abdominal pain, diarrhea, nausea and vomiting.  Musculoskeletal:  Negative for myalgias.  Skin:  Negative for  itching and rash.  Neurological:  Positive for dizziness and weakness. Negative for headaches.       Objective:    Vitals BP 110/80 (BP Location: Left Arm, Patient Position: Sitting, Cuff Size: Normal)   Pulse 82   Temp 97.8 F (36.6 C)   Resp 18   Ht 5\' 4"  (1.626 m)   Wt 195 lb (88.5 kg)   SpO2 97%   BMI 33.47 kg/m  Physical Examination Physical Exam Constitutional:      Appearance: Normal appearance. She is not ill-appearing.  Cardiovascular:     Rate and Rhythm: Normal rate and regular rhythm.     Pulses: Normal pulses.     Heart sounds: No murmur heard.    No friction rub. No gallop.  Pulmonary:     Effort: Pulmonary effort is normal. No respiratory distress.     Breath sounds: No wheezing, rhonchi or rales.  Abdominal:     General: Bowel sounds are normal. There is no distension.     Palpations: Abdomen is soft.     Tenderness: There is no abdominal tenderness.  Musculoskeletal:     Right lower leg: No edema.     Left lower leg: No edema.  Skin:    General: Skin is warm and dry.     Findings: No rash.  Neurological:     Mental Status: She is alert.        Assessment & Plan:   Hypoxia She is not having an ashtma or COPD excerbation today.  She states her breathing has improved.  Her oxygen sats at rest are 97% on 2L.  I took her oxygen off and she was satting at 96%.  We had her ambulate on RA and she maintained at 97% on RA.  Her HR went up to 107.  We saw a lot of this tachycardia after COVID a few years ago.  She has seen cardiology and will continue this plan.  I told her she could come off the oxygen.    Return in about 2 months (around 03/23/2023).   Crist Fat, MD

## 2023-01-22 ENCOUNTER — Ambulatory Visit: Payer: PPO | Admitting: Internal Medicine

## 2023-01-28 ENCOUNTER — Other Ambulatory Visit: Payer: Self-pay | Admitting: Internal Medicine

## 2023-01-28 MED ORDER — ZOLPIDEM TARTRATE 10 MG PO TABS: 10.0000 mg | ORAL_TABLET | Freq: Every evening | ORAL | 2 refills | Status: DC | PRN

## 2023-02-04 ENCOUNTER — Encounter: Payer: Self-pay | Admitting: Internal Medicine

## 2023-02-08 DIAGNOSIS — R0902 Hypoxemia: Secondary | ICD-10-CM

## 2023-02-17 ENCOUNTER — Ambulatory Visit: Payer: PPO | Attending: Cardiology | Admitting: Cardiology

## 2023-02-17 ENCOUNTER — Encounter: Payer: Self-pay | Admitting: Cardiology

## 2023-02-17 VITALS — BP 92/70 | HR 86 | Ht 64.0 in | Wt 193.2 lb

## 2023-02-17 DIAGNOSIS — R072 Precordial pain: Secondary | ICD-10-CM

## 2023-02-17 DIAGNOSIS — R0789 Other chest pain: Secondary | ICD-10-CM | POA: Diagnosis not present

## 2023-02-17 DIAGNOSIS — R0609 Other forms of dyspnea: Secondary | ICD-10-CM | POA: Diagnosis not present

## 2023-02-17 DIAGNOSIS — R002 Palpitations: Secondary | ICD-10-CM | POA: Diagnosis not present

## 2023-02-17 DIAGNOSIS — I10 Essential (primary) hypertension: Secondary | ICD-10-CM

## 2023-02-17 MED ORDER — AMLODIPINE BESYLATE 5 MG PO TABS: 5.0000 mg | ORAL_TABLET | Freq: Every day | ORAL | 3 refills | Status: DC

## 2023-02-17 NOTE — Patient Instructions (Addendum)
Medication Instructions:  DECREASE: Amlodipine to 5mg  daily- You may cut your current dose in half, your next refill will reflect your new dose.   Lab Work: None Ordered If you have labs (blood work) drawn today and your tests are completely normal, you will receive your results only by: MyChart Message (if you have MyChart) OR A paper copy in the mail If you have any lab test that is abnormal or we need to change your treatment, we will call you to review the results.   Testing/Procedures: 24hr BP monitor- next week   Follow-Up: At [facility], you and your health needs are our priority.  As part of our continuing mission to provide you with exceptional heart care, we have created designated Provider Care Teams.  These Care Teams include your primary Cardiologist (physician) and Advanced Practice Providers (APPs -  Physician Assistants and Nurse Practitioners) who all work together to provide you with the care you need, when you need it.  We recommend signing up for the patient portal called "MyChart".  Sign up information is provided on this After Visit Summary.  MyChart is used to connect with patients for Virtual Visits (Telemedicine).  Patients are able to view lab/test results, encounter notes, upcoming appointments, etc.  Non-urgent messages can be sent to your provider as well.   To learn more about what you can do with MyChart, go to [email or web address].    Your next appointment:   2 month(s)  The format for your next appointment:   In Person  Provider:   Gypsy Balsam, MD    Other Instructions NA

## 2023-02-17 NOTE — Progress Notes (Signed)
Cardiology Office Note:    Date:  02/17/2023   ID:  Morgan [name], DOB 1960-12-10, MRN [id number]  PCP:  Crist Fat, MD  Cardiologist:  Gypsy Balsam, MD    Referring MD: Leonia Reader, Barbara Cower, MD   Chief Complaint  Patient presents with   Tachycardia    History of Present Illness:    Morgan [name] is a 62 y.o. female with past medical history significant for what appears to be fibromyalgia, GERD, essential hypertension, hyperlipidemia, depression, fatigue, inappropriate sinus tachycardia.  Apparently in December she got COVID after that she started having problems she complained of having palpitations so she could get the sensation that her heart starts beating up.  Also she got episode that suddenly she started being sweaty swimmy headed nauseated.  In the matter-of-fact she describes 1 episode like this today before she came to our office she was trying to sweep the floor started getting the sensation have to sit down and lay down and after that she is feeling poorly.  She did wear monitor which shows some episode of supraventricular tachycardia but those were asymptomatic, her symptoms were related to sinus tachycardia, she did have echocardiogram which was normal, she did have a stress test which was also normal and after the coronary CT angio which showed calcium score 0 with no coronary artery disease.  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  She does not exercise on the regular basis she spent 0.0 slammed being at home.  Last time she saw our nurse practitioner who very appropriately advised her to stay well-hydrated and as well asked to lay down when she started the sensation feeling coming.  She has been complying with this and coping with the situation quite well, she was noted to have elevated blood pressure lately and increase her amlodipine 1 more time.  Past Medical History:  Diagnosis Date   Allergy     seasonal   Anemia    Anxiety    Arthritis    Asthma    Celiac disease    Depression    Fibromyalgia    Fibula fracture    GERD (gastroesophageal reflux disease)    Headache    HLD (hyperlipidemia)    Hx of migraines    Hypertension    Hypothyroidism    Lupus (HCC)    Myalgia and myositis, unspecified    Other B-complex deficiencies    PTSD (post-traumatic stress disorder)    Recurrent HSV (herpes simplex virus)    Thoracic outlet syndrome    Thyroiditis    Unspecified hemorrhoids without mention of complication    Varicose veins of lower extremities with other complications    Vertigo     Past Surgical History:  Procedure Laterality Date   ABDOMINAL HYSTERECTOMY  1998   COLONOSCOPY  05/14/1999   biopsy negative   ESOPHAGOGASTRODUODENOSCOPY  07/13/2002   normal   FIBULA FRACTURE SURGERY     MANDIBLE SURGERY     NASAL SINUS SURGERY     OTHER SURGICAL HISTORY     laser surgery for endometriosis   TOTAL VAGINAL HYSTERECTOMY  04/12/1997   VENOGRAM N/A 03/06/2014   Procedure: VENOGRAM;  Surgeon: Fransisco Hertz, MD;  Location[facility] CATH LAB;  Service: Cardiovascular;  Laterality: N/A;    Current Medications: Current Meds  Medication Sig   acetaminophen (TYLENOL) 500 MG tablet Take 500 mg by mouth every 6 (six) hours as needed for mild  pain or moderate pain.   amLODipine (NORVASC) 10 MG tablet TAKE ONE TABLET BY MOUTH EVERYDAY AT BEDTIME (Patient taking differently: Take 10 mg by mouth daily.)   budesonide-formoterol (SYMBICORT) 160-4.5 MCG/ACT inhaler Inhale 2 puffs into the lungs at bedtime.   buPROPion (WELLBUTRIN XL) 300 MG 24 hr tablet TAKE ONE TABLET BY MOUTH EVERY MORNING   carisoprodol (SOMA) 350 MG tablet Take 1 tablet (350 mg total) by mouth 3 (three) times daily as needed for muscle spasms.   clobetasol ointment (TEMOVATE) 0.05 % Apply 1 application  topically 2 (two) times daily as needed.   clonazePAM (KLONOPIN) 0.5 MG tablet Take 1 tablet (0.5 mg total) by mouth  Once PRN for anxiety.   Cyanocobalamin (B-12) 3000 MCG CAPS Take 1 capsule by mouth daily.   dapsone 25 MG tablet Take 25-100 mg by mouth daily as needed (for dermatitis).    DULoxetine (CYMBALTA) 60 MG capsule TAKE ONE CAPSULE BY MOUTH EVERY EVENING   esomeprazole (NEXIUM) 40 MG capsule Take 40 mg by mouth daily at 12 noon.   famotidine (PEPCID) 10 MG tablet Take 1 tablet by mouth at bedtime.   fluticasone (FLONASE) 50 MCG/ACT nasal spray Place 1 spray into both nostrils daily.   ibuprofen (ADVIL,MOTRIN) 200 MG tablet Take 800 mg by mouth 2 (two) times daily as needed for headache or moderate pain.   levothyroxine (SYNTHROID) 100 MCG tablet TAKE ONE TABLET BY MOUTH monday-saturday before breakfast and TAKE TWO TABLETS BY MOUTH ONCE WEEKLY BEFORE BREAKFAST ON SUNDAYS (Patient taking differently: Take 100 mcg by mouth daily before breakfast.)   loratadine (CLARITIN) 10 MG tablet Take 10 mg by mouth daily.     metoprolol tartrate (LOPRESSOR) 25 MG tablet TAKE ONE TABLET BY MOUTH TWICE DAILY   rosuvastatin (CRESTOR) 10 MG tablet TAKE ONE TABLET BY MOUTH EVERYDAY AT BEDTIME (Patient taking differently: Take 10 mg by mouth daily.)   Sod Fluoride-Potassium Nitrate (SODIUM FLUORIDE 5000 SENSITIVE) 1.1-5 % GEL Take 1 Application by mouth 2 (two) times daily.   valACYclovir (VALTREX) 500 MG tablet Take 1-2 tablets (500-1,000 mg total) by mouth daily as needed (for outbreaks).   zolpidem (AMBIEN) 10 MG tablet Take 1 tablet (10 mg total) by mouth at bedtime as needed for sleep.   [DISCONTINUED] amLODipine (NORVASC) 5 MG tablet Take 5 mg by mouth daily.     Allergies:   Iodine, Iohexol, Gabapentin, and Pregabalin   Social History   Socioeconomic History   Marital status: Divorced    Spouse name: Not on file   Number of children: 0   Years of education: Not on file   Highest education level: Not on file  Occupational History   Occupation: Radiographer, therapeutic: [employer]   Occupation:  Disability  Tobacco Use   Smoking status: Never   Smokeless tobacco: Never  Vaping Use   Vaping status: Never Used  Substance and Sexual Activity   Alcohol use: Yes    Comment: Very rare   Drug use: No   Sexual activity: Not on file  Other Topics Concern   Not on file  Social History Narrative   Divorced    No children    Living in [place] (12/10)   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 ROS:   Please see the history of present illness.    All 14 point review of systems negative except as described per history of present illness  EKGs/Labs/Other Studies Reviewed:    EKG Interpretation Date/Time:  Wednesday February 17 2023 14:22:44 EDT Ventricular Rate:  86 PR Interval:  166 QRS Duration:  76 QT Interval:  366 QTC Calculation: 437 R Axis:   54  Text Interpretation: Normal sinus rhythm Normal ECG When compared with ECG of 06-Mar-2014 05:59, No significant change was found Confirmed by Gypsy Balsam [phone]) on 02/17/2023 2:32:49 PM    Recent Labs: 09/04/2022: BNP 12.9; BUN 14; Creatinine, Ser 0.79; Potassium 4.0; Sodium 140; TSH 1.550  Recent Lipid Panel    Component Value Date/Time   CHOL 174 08/04/2011 0500   TRIG 54.0 08/04/2011 0500   HDL 39.00 (L) 08/04/2011 0500   CHOLHDL 4 08/04/2011 0500   VLDL 10.8 08/04/2011 0500   LDLCALC 124 (H) 08/04/2011 0500    Physical Exam:    VS:   BP 92/70 (BP Location: Left Arm, Patient Position: Sitting)   Pulse 86   Ht 5\' 4"  (1.626 m)   Wt 193 lb 3.2 oz (87.6 kg)   SpO2 96%   BMI 33.16 kg/m     Wt Readings from Last 3 Encounters:  02/17/23 193 lb 3.2 oz (87.6 kg)  01/20/23 195 lb (88.5 kg)  11/16/22 199 lb (90.3 kg)     GEN:  Well nourished, well developed in no acute distress HEENT: Normal NECK: No JVD; No carotid bruits LYMPHATICS: No lymphadenopathy CARDIAC: RRR, no murmurs, no rubs, no gallops RESPIRATORY:  Clear to auscultation without rales, wheezing or rhonchi  ABDOMEN: Soft, non-tender, non-distended MUSCULOSKELETAL:  No edema; No deformity  SKIN: Warm and dry LOWER EXTREMITIES: no swelling NEUROLOGIC:  Alert and oriented x 3 PSYCHIATRIC:  Normal affect   ASSESSMENT:    1. Precordial pain   2. Chest pain, atypical   3. Dyspnea on exertion   4. Palpitations    PLAN:    In order of problems listed above:  Precordial chest pain.  Denies having any, all cardiac workup so far negative. Autonomic instability.  I encouraged her to drink plenty of fluids to wear elastic stockings, I will ask her to cut down amlodipine from 10 to 5 mg daily, in a week I will put 24 hours blood pressure monitor monitor on her to see exactly what the distribution of the blood pressure within 24 hours is.  I encouraged her also her to start doing some exercises standing gently carefully going for a walk want her to stay well-hydrated so she needs to drink some water before going and when she is there she needs to keep drinking some fluids. Dyspnea exertion all cardiac workup negative I think part of this could be deconditioning, on top of that she was anemic which probably contribute to her symptomatology.  She have appointment scheduled to have her blood count checked next month.   Medication Adjustments/Labs and Tests Ordered: Current medicines are reviewed at length with the patient today.  Concerns regarding medicines are  outlined above.  Orders Placed This Encounter  Procedures   EKG 12-Lead   Medication changes: No orders of the defined types were placed in this encounter.   Signed, Georgeanna Lea, MD, [facility] 02/17/2023 2:58 PM    [facility]

## 2023-02-17 NOTE — Addendum Note (Signed)
Addended by: Baldo Ash D on: 02/17/2023 03:07 PM   Modules accepted: Orders

## 2023-02-18 ENCOUNTER — Telehealth: Payer: Self-pay

## 2023-02-18 NOTE — Telephone Encounter (Signed)
LM to return my call to schedule 24 BP monitor.

## 2023-02-19 NOTE — Telephone Encounter (Signed)
Appt schedule

## 2023-02-19 NOTE — Telephone Encounter (Signed)
Patient was returning call. Please advise ?

## 2023-02-24 ENCOUNTER — Ambulatory Visit: Payer: PPO | Attending: Cardiology

## 2023-02-24 DIAGNOSIS — I10 Essential (primary) hypertension: Secondary | ICD-10-CM

## 2023-02-26 ENCOUNTER — Other Ambulatory Visit: Payer: Self-pay | Admitting: Internal Medicine

## 2023-03-18 ENCOUNTER — Other Ambulatory Visit: Payer: Self-pay | Admitting: Internal Medicine

## 2023-03-19 ENCOUNTER — Telehealth: Payer: Self-pay

## 2023-03-19 NOTE — Telephone Encounter (Signed)
Left message on My Chart with normal results per Dr. Krasowski's note. Routed to PCP. 

## 2023-03-22 ENCOUNTER — Encounter: Payer: Self-pay | Admitting: Internal Medicine

## 2023-03-22 ENCOUNTER — Other Ambulatory Visit: Payer: Self-pay | Admitting: Internal Medicine

## 2023-03-22 ENCOUNTER — Ambulatory Visit: Payer: PPO | Admitting: Internal Medicine

## 2023-03-22 VITALS — BP 126/78 | HR 61 | Temp 97.2°F | Resp 18 | Ht 64.0 in | Wt 192.0 lb

## 2023-03-22 DIAGNOSIS — R7303 Prediabetes: Secondary | ICD-10-CM

## 2023-03-22 DIAGNOSIS — E039 Hypothyroidism, unspecified: Secondary | ICD-10-CM | POA: Diagnosis not present

## 2023-03-22 DIAGNOSIS — F331 Major depressive disorder, recurrent, moderate: Secondary | ICD-10-CM

## 2023-03-22 DIAGNOSIS — I10 Essential (primary) hypertension: Secondary | ICD-10-CM | POA: Diagnosis not present

## 2023-03-22 DIAGNOSIS — D649 Anemia, unspecified: Secondary | ICD-10-CM | POA: Insufficient documentation

## 2023-03-22 DIAGNOSIS — F411 Generalized anxiety disorder: Secondary | ICD-10-CM | POA: Diagnosis not present

## 2023-03-22 DIAGNOSIS — G4700 Insomnia, unspecified: Secondary | ICD-10-CM | POA: Diagnosis not present

## 2023-03-22 HISTORY — DX: Major depressive disorder, recurrent, moderate: F33.1

## 2023-03-22 HISTORY — DX: Essential (primary) hypertension: I10

## 2023-03-22 HISTORY — DX: Prediabetes: R73.03

## 2023-03-22 MED ORDER — CLONAZEPAM 0.5 MG PO TABS: 0.5000 mg | ORAL_TABLET | Freq: Two times a day (BID) | ORAL | 2 refills | Status: DC | PRN

## 2023-03-22 NOTE — Assessment & Plan Note (Signed)
Plan as above.  

## 2023-03-22 NOTE — Assessment & Plan Note (Signed)
I want her to continue on her current meds.  She states clonazepam helps with sleep.  I asked her to go from once a day dosing to BID dosing prn.

## 2023-03-22 NOTE — Assessment & Plan Note (Signed)
Her BP is currently controlled.  We will continue on her current meds.

## 2023-03-22 NOTE — Assessment & Plan Note (Signed)
She seems euthyroid.  WE will check her TFT's today.

## 2023-03-22 NOTE — Progress Notes (Signed)
Office Visit  Subjective   Morgan [name] ID: Morgan Morgan [name]   DOB: 08/26/1960   Age: 62 y.o.   MRN: [id number]   Chief Complaint Chief Complaint  Morgan [name] presents with   Follow-up     History of Present Illness Morgan Morgan [name] is a 62 yo female who comes in today for followup where I saw her 2 months ago for a followup hospital discharge where she where she was admitted to Morgan Morgan [name] in 12/2022.  She began breaking out with herpatiformis dermatitis where this has been associated with her celiac disease.  She began taking dapsone on her own accord and has actually had to take some dapsone this week.  Morgan Morgan [name] began having increased SOB and pain radiation down her left arm for a few days before admission.  She went to Morgan Morgan [name] but was found hypoxic with oxygen sats at home of 83%.  They sent her to Morgan Morgan [name] where they did a CXR that showed no active cardiopulmonary disease.  They also did a CTA of Morgan chest that showed no acute cardiopulmonary disease and no evidence of PE.  They noted she had some tachycardia but she had told them she has positional tachycardia with heart rate in Morgan 140's with just simple walking as an outpatient since 08/2022.  She was admitted to Morgan Morgan [name] with acute COPD exacerbation with hypoxia.  She was placed on oxygen.  They noted she had no wheezing and did not feel her hypoxia was related to her asthma.  She tells me that she has had problems with anemia with dapsone in Morgan past.  They noted she had anemia with a HgB 10.6 and they did iron studies where her iron was 52 and her ferritin was 19 with a reticulocyte count at 4%.  She was placed on iron and was sent home prednisone and oxygen via Morgan [name].  She is now off oxygen.  She remains on oral iron.  She denies any problems with breathing today.    Morgan Morgan [name] is a 62 yo female returns for followup of her depression and anxiety as well as her PTSD.  She is having some increased anxiety due to her recent illness as well as problems with her  father's health.  Today, she states her depression is moderate but her anxiety is severe and she is having problems with worsening insomnia.  I did see her in 06/24/2022 for worsening insomnia since her COVID-19 infection.  I had a discussion with her regarding her soma and clonazepam.  I only wanted her to use clonazepam as needed and not for sleep.  We started her on a trial of ambien but she had to stop this as it made her do things at night she would not remember including shopping and cooking.  I had increased her amitriptyline from 25mg  to 50mg  at night which had helped but she developed dry mouth and eyes and again was weaned off her amitriptyline.  She remains on wellbutryin XL 300mg  daily, cymbalta 60mg  po daily, and clonazepam 0.5mg  po BID prn which she she is using 3-4 times per day during Morgan day.  She is no longer seeing a counsellor due to Morgan COVID-19 pandemic.   She denies difficulty concentrating, difficulty performing routine daily activities, fatigue, helpless feeling, suicidal ideation, loss of appetite, and social withdrawal. This Morgan [name] feels that she is able to [name] for herself. She currently lives alone. She has no significant prior history of mental health disorders. She was using amitryptyline  for her nerve pain down her left arm from her thoracic outlet syndrome but was also using this for her depression and sleep.    Morgan Morgan [name] is a 62 year old Caucasian/White female who returns for a follow-up visit for her prediabetes. On her yearly exam in 04/2021 we noted that she was prediabetic with a HgBA1c of 6%. I asked her to watch her diet and exercise. Since Morgan last visit, there have been no problems. She remains on no medications; Morgan prediabetes is being managed by dietary intervention alone. She is not walking as much as they would like.. She specifically denies unexplained abdominal pain, nausea or vomiting. She does not routinely check blood sugars. She came in fasting today in  anticipation of lab work.   Morgan Morgan [name] is a 62 year old Caucasian/White female who returns for a regularly scheduled thyroid check.  She is currently on levothyroxine Mon-Sat and on Sundays.  She claims to have no symptoms suggestive of thyroid imbalance specifically denying cold intolerance, heat intolerance, tremors, unexplained weight changes.  She was having some dry mouth episodes where she saw her rheumatologist and they ruled out Sjogrens syndrome with negative SSA/SSB antibodies.  I reviewed their notes and they felt her dry mouth was due to her amitriptyline.    Morgan Morgan [name] is a 62 year old Caucasian/White female who presents for a follow-up evaluation of hypertension.   She had some autonomic instablity since COVID-19 with her BP.  Cardiology has seen her and reduced her amlodipine from 10mg  to 5mg  daily since her last visit.  Morgan Morgan [name] has  been checking her blood pressure at home.  Her systolic BP has been running 120-130 range.  Morgan Morgan [name]'s current medications include: amlodipine 5 mg daily and metoprolol tartrate 25 mg twice daily. Morgan Morgan [name] has been tolerating her medications well. Morgan Morgan [name] denies any visual changes, chest pain, shortness of breath, and weakness/numbness. She reports there have been no other symptoms noted.        Past Medical History Past Medical History:  Diagnosis Date   Allergy    seasonal   Anemia    Anxiety    Arthritis    Asthma    Celiac disease    Depression    Fibromyalgia    Fibula fracture    GERD (gastroesophageal reflux disease)    Headache    HLD (hyperlipidemia)    Hx of migraines    Hypertension    Hypothyroidism    Lupus (HCC)    Myalgia and myositis, unspecified    Other B-complex deficiencies    PTSD (post-traumatic stress disorder)    Recurrent HSV (herpes simplex virus)    Thoracic outlet syndrome    Thyroiditis    Unspecified hemorrhoids without mention of complication    Varicose veins of lower  extremities with other complications    Vertigo      Allergies Allergies  Allergen Reactions   Iodine Other (See Comments)    REACTION: SOB with injectable iodine   Iohexol Shortness Of Breath   Gabapentin    Pregabalin Other (See Comments)    Altered mental status     Medications  Current Outpatient Medications:    acetaminophen (TYLENOL) 500 MG tablet, Take 500 mg by mouth every 6 (six) hours as needed for mild pain or moderate pain., Disp: , Rfl:    amLODipine (NORVASC) 5 MG tablet, Take 1 tablet (5 mg total) by mouth daily., Disp: 90 tablet, Rfl: 3  budesonide-formoterol (SYMBICORT) 160-4.5 MCG/ACT inhaler, Inhale 2 puffs into Morgan lungs at bedtime., Disp: , Rfl:    buPROPion (WELLBUTRIN XL) 300 MG 24 hr tablet, TAKE ONE TABLET BY MOUTH EVERY MORNING, Disp: 90 tablet, Rfl: 1   carisoprodol (SOMA) 350 MG tablet, Take 1 tablet (350 mg total) by mouth 3 (three) times daily as needed for muscle spasms., Disp: 90 tablet, Rfl: 1   clobetasol ointment (TEMOVATE) 0.05 %, Apply 1 application  topically 2 (two) times daily as needed., Disp: , Rfl:    clonazePAM (KLONOPIN) 0.5 MG tablet, TAKE 1 TABLET(0.5 MG) BY MOUTH 1 TIME AS NEEDED FOR ANXIETY, Disp: 30 tablet, Rfl: 1   Cyanocobalamin (B-12) 3000 MCG CAPS, Take 1 capsule by mouth daily., Disp: , Rfl:    dapsone 25 MG tablet, Take 25-100 mg by mouth daily as needed (for dermatitis). , Disp: , Rfl:    DULoxetine (CYMBALTA) 60 MG capsule, TAKE ONE CAPSULE BY MOUTH EVERY EVENING, Disp: 90 capsule, Rfl: 1   esomeprazole (NEXIUM) 40 MG capsule, Take 40 mg by mouth daily at 12 noon., Disp: , Rfl:    famotidine (PEPCID) 10 MG tablet, Take 1 tablet by mouth at bedtime., Disp: , Rfl:    fluticasone (FLONASE) 50 MCG/ACT nasal spray, Place 1 spray into both nostrils daily., Disp: , Rfl:    ibuprofen (ADVIL,MOTRIN) 200 MG tablet, Take 800 mg by mouth 2 (two) times daily as needed for headache or moderate pain., Disp: , Rfl:    levothyroxine  (SYNTHROID) 100 MCG tablet, TAKE ONE TABLET BY MOUTH monday-saturday before breakfast and TAKE TWO TABLETS BY MOUTH ONCE WEEKLY BEFORE BREAKFAST ON SUNDAYS (Morgan [name] taking differently: Take 100 mcg by mouth daily before breakfast.), Disp: 90 tablet, Rfl: 1   loratadine (CLARITIN) 10 MG tablet, Take 10 mg by mouth daily.  , Disp: , Rfl:    metoprolol tartrate (LOPRESSOR) 25 MG tablet, TAKE ONE TABLET BY MOUTH TWICE DAILY, Disp: 180 tablet, Rfl: 1   rosuvastatin (CRESTOR) 10 MG tablet, TAKE ONE TABLET BY MOUTH EVERYDAY AT BEDTIME (Morgan [name] taking differently: Take 10 mg by mouth daily.), Disp: 90 tablet, Rfl: 1   Sod Fluoride-Potassium Nitrate (SODIUM FLUORIDE 5000 SENSITIVE) 1.1-5 % GEL, Take 1 Application by mouth 2 (two) times daily., Disp: , Rfl:    valACYclovir (VALTREX) 500 MG tablet, Take 1-2 tablets (500-1,000 mg total) by mouth daily as needed (for outbreaks)., Disp: 30 tablet, Rfl: 1   Review of Systems Review of Systems  Constitutional:  Negative for chills and fever.  Eyes:  Negative for blurred vision and double vision.  Respiratory:  Negative for cough, shortness of breath and wheezing.   Cardiovascular:  Negative for chest pain, palpitations and leg swelling.  Gastrointestinal:  Negative for abdominal pain, constipation, diarrhea, nausea and vomiting.  Genitourinary:  Negative for frequency.  Musculoskeletal:  Negative for myalgias.  Skin:  Positive for itching and rash.  Neurological:  Negative for dizziness, weakness and headaches.  Endo/Heme/Allergies:  Negative for polydipsia.       Objective:    Vitals BP 126/78   Pulse 61   Temp (!) 97.2 F (36.2 C)   Resp 18   Ht 5\' 4"  (1.626 m)   Wt 192 lb (87.1 kg)   SpO2 95%   BMI 32.96 kg/m    Physical Examination Physical Exam Constitutional:      Appearance: Normal appearance. She is not ill-appearing.  Cardiovascular:     Rate and Rhythm: Normal rate and regular rhythm.  Pulses: Normal pulses.     Heart sounds:  No murmur heard.    No friction rub. No gallop.  Pulmonary:     Effort: Pulmonary effort is normal. No respiratory distress.     Breath sounds: No wheezing, rhonchi or rales.  Abdominal:     General: Bowel sounds are normal. There is no distension.     Palpations: Abdomen is soft.     Tenderness: There is no abdominal tenderness.  Musculoskeletal:     Right lower leg: No edema.     Left lower leg: No edema.  Skin:    General: Skin is warm and dry.     Findings: No rash.  Neurological:     General: No focal deficit present.     Mental Status: She is alert and oriented to person, place, and time.  Psychiatric:        Mood and Affect: Mood normal.        Behavior: Behavior normal.        Assessment & Plan:   Essential hypertension Her BP is currently controlled.  We will continue on her current meds.  HYPOTHYROIDISM She seems euthyroid.  WE will check her TFT's today.  Prediabetes We will check her HgBA1c today.  I want her to continue to diet and exercise.  Insomnia I want her to continue on her current meds.  She states clonazepam helps with sleep.  I asked her to go from once a day dosing to BID dosing prn.  GAD (generalized anxiety disorder) She is to continue on her current psychotropic meds.  DEPRESSION Plan as above.  Anemia We will recheck her anemia labs today including her blood counts and her iron studies.    Return in about 3 months (around 06/21/2023) for annual.   Crist Fat, MD

## 2023-03-22 NOTE — Assessment & Plan Note (Signed)
We will recheck her anemia labs today including her blood counts and her iron studies.

## 2023-03-22 NOTE — Assessment & Plan Note (Signed)
We will check her HgBA1c today.  I want her to continue to diet and exercise.

## 2023-03-22 NOTE — Assessment & Plan Note (Signed)
She is to continue on her current psychotropic meds.

## 2023-03-23 LAB — CBC WITH DIFFERENTIAL/PLATELET
Basophils Absolute: 0 10*3/uL (ref 0.0–0.2)
Basos: 0 %
EOS (ABSOLUTE): 0 10*3/uL (ref 0.0–0.4)
Eos: 0 %
Hematocrit: 39.4 % (ref 34.0–46.6)
Hemoglobin: 12.3 g/dL (ref 11.1–15.9)
Immature Grans (Abs): 0 10*3/uL (ref 0.0–0.1)
Immature Granulocytes: 0 %
Lymphocytes Absolute: 3.4 10*3/uL — ABNORMAL HIGH (ref 0.7–3.1)
Lymphs: 44 %
MCH: 28.3 pg (ref 26.6–33.0)
MCHC: 31.2 g/dL — ABNORMAL LOW (ref 31.5–35.7)
MCV: 91 fL (ref 79–97)
Monocytes Absolute: 0.9 10*3/uL (ref 0.1–0.9)
Monocytes: 12 %
Neutrophils Absolute: 3.5 10*3/uL (ref 1.4–7.0)
Neutrophils: 44 %
Platelets: 400 10*3/uL (ref 150–450)
RBC: 4.34 x10E6/uL (ref 3.77–5.28)
RDW: 14.9 % (ref 11.7–15.4)
WBC: 7.8 10*3/uL (ref 3.4–10.8)

## 2023-03-23 LAB — IRON: Iron: 75 ug/dL (ref 27–159)

## 2023-03-23 LAB — TSH: TSH: 1.02 u[IU]/mL (ref 0.450–4.500)

## 2023-03-23 LAB — FERRITIN: Ferritin: 9 ng/mL — ABNORMAL LOW (ref 15–150)

## 2023-03-23 LAB — T4, FREE: Free T4: 1.39 ng/dL (ref 0.82–1.77)

## 2023-03-23 LAB — HEMOGLOBIN A1C
Est. average glucose Bld gHb Est-mCnc: 126 mg/dL
Hgb A1c MFr Bld: 6 % — ABNORMAL HIGH (ref 4.8–5.6)

## 2023-03-24 ENCOUNTER — Ambulatory Visit: Payer: PPO | Admitting: Internal Medicine

## 2023-03-25 NOTE — Progress Notes (Signed)
She can stop the oral iron. Send her the 3 stool guiac cards kit (not FIT test) and have her return this. Her labs look good.  Patient is aware, will pick up guiac cards 9/13

## 2023-04-24 ENCOUNTER — Other Ambulatory Visit: Payer: Self-pay | Admitting: Internal Medicine

## 2023-04-27 ENCOUNTER — Other Ambulatory Visit: Payer: Self-pay | Admitting: Internal Medicine

## 2023-04-27 MED ORDER — CARISOPRODOL 350 MG PO TABS: 350.0000 mg | ORAL_TABLET | Freq: Three times a day (TID) | ORAL | 0 refills | Status: DC | PRN

## 2023-05-05 ENCOUNTER — Encounter: Payer: Self-pay | Admitting: Cardiology

## 2023-05-06 ENCOUNTER — Encounter: Payer: Self-pay | Admitting: Cardiology

## 2023-05-06 ENCOUNTER — Ambulatory Visit: Payer: PPO | Attending: Cardiology | Admitting: Cardiology

## 2023-05-06 VITALS — BP 122/78 | HR 76 | Ht 64.0 in | Wt 189.0 lb

## 2023-05-06 DIAGNOSIS — R002 Palpitations: Secondary | ICD-10-CM

## 2023-05-06 DIAGNOSIS — I10 Essential (primary) hypertension: Secondary | ICD-10-CM

## 2023-05-06 DIAGNOSIS — R7303 Prediabetes: Secondary | ICD-10-CM

## 2023-05-06 NOTE — Progress Notes (Signed)
Cardiology Office Note:    Date:  05/06/2023   ID:  Morgan [name], DOB 11/09/1960, MRN [id number]  PCP:  Crist Fat, MD  Cardiologist:  Gypsy Balsam, MD    Referring MD: Leonia Reader, Barbara Cower, MD   Chief Complaint  Patient presents with   Follow-up    History of Present Illness:    Morgan [name] is a 62 y.o. female with past medical history significant for fibromyalgia, GERD, essential hypertension, hyperlipidemia, depression, fatigue, inappropriate sinus tachycardia.  All problems started after COVID since that time she does have palpitations.  She gets quite extensive cardiac evaluation done including monitor which did not show any significant pathology except sinus tachycardia, she did have supraventricular tachycardia but those were asymptomatic, she did have coronary CT angio calcium score 0 and normal coronaries.  Echocardiogram normal ejection fraction.  What I did last time I reduce dose of her amlodipine to 5 mg daily she was 24 blood pressure monitor which showed relatively low blood pressure.  She said this maneuver make her feel little bit better she is try to BL be more active that seems to be helping as well.  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   Disorder of skin or subcutaneous tissue 07/11/2010   Qualifier: Diagnosis of   By: Milinda Antis MD, Colon Flattery     IMO SNOMED Dx Update Oct 2024     DOE (dyspnea on exertion) 10/26/2022   Dry eye syndrome of both eyes  06/24/2020   Dysphonia 03/08/2019   Dyspnea 08/26/2022   Essential hypertension 03/22/2023   Fatigue 08/26/2011   Fibromyalgia    Fibula fracture    GAD (generalized anxiety disorder) 09/21/2007   Sees psychiatrist - has had inpt tx         GERD 05/20/2007   Qualifier: Diagnosis of   By: Milinda Antis MD, Colon Flattery        GERD (gastroesophageal reflux disease)    Headache    Hemorrhoids 05/20/2007   Qualifier: Diagnosis of   By: Milinda Antis MD, Colon Flattery     IMO SNOMED Dx Update Oct 2024     Herpes simplex virus (HSV) infection 05/20/2007   IMO SNOMED Dx Update Oct 2024     History of asthma 03/08/2019   History of balance disorder 12/04/2011   HLD (hyperlipidemia)    Hx of migraines    Hypertension    Hypothyroidism    Hypoxia 01/20/2023   Insomnia 06/24/2022   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   Joint pain 08/26/2011   Seeing Dr Jon Billings for fibromyalgia and possible SLE or other autoimmune variant  Also bursitis of hips and some OA     Laryngospasms 03/08/2019   Leg pain 12/04/2011   Lupus    Mild episode of recurrent major depressive disorder (HCC) 07/24/2022   Moderate episode of recurrent major depressive  disorder (HCC) 03/22/2023   Myalgia and myositis, unspecified    Neck pain 12/04/2011   Numbness 03/08/2014   Other B-complex deficiencies    Palpitations 07/24/2022   Prediabetes 03/22/2023   PTSD (post-traumatic stress disorder)    Recurrent HSV (herpes simplex virus)    Sacroiliitis (HCC) 05/26/2012   Shoulder impingement syndrome, left 10/22/2021   Strain of left trapezius muscle 04/10/2014   Thoracic outlet syndrome    Thoracic outlet syndrome of left thoracic outlet 05/22/2015   Thyroiditis    Thyrotoxicosis 05/20/2007   Qualifier: Diagnosis of   By: Milinda Antis MD, Colon Flattery     IMO SNOMED Dx Update Oct 2024     Tinea corporis 08/04/2011   Unspecified hemorrhoids without mention of complication    Varicose veins of bilateral lower extremities with other complications  07/11/2010   Qualifier: Diagnosis of   By: Milinda Antis MD, Colon Flattery     IMO SNOMED Dx Update Oct 2024     Varicose veins of lower extremities with other complications    Vertigo    Vitreous floater, bilateral 06/24/2020    Past Surgical History:  Procedure Laterality Date   ABDOMINAL HYSTERECTOMY  1998   COLONOSCOPY  05/14/1999   biopsy negative   ESOPHAGOGASTRODUODENOSCOPY  07/13/2002   normal   FIBULA FRACTURE SURGERY     MANDIBLE SURGERY     NASAL SINUS SURGERY     OTHER SURGICAL HISTORY     laser surgery for endometriosis   TOTAL VAGINAL HYSTERECTOMY  04/12/1997   VENOGRAM N/A 03/06/2014   Procedure: VENOGRAM;  Surgeon: Fransisco Hertz, MD;  Location: [facility] CATH LAB;  Service: Cardiovascular;  Laterality: N/A;    Current Medications: Current Meds  Medication Sig   acetaminophen (TYLENOL) 500 MG tablet Take 500 mg by mouth every 6 (six) hours as needed for mild pain or moderate pain.   amLODipine (NORVASC) 5 MG tablet Take 1 tablet (5 mg total) by mouth daily.   budesonide-formoterol (SYMBICORT) 160-4.5 MCG/ACT inhaler Inhale 2 puffs into the lungs at bedtime.   buPROPion (WELLBUTRIN XL) 300 MG 24 hr tablet TAKE ONE TABLET BY MOUTH EVERY MORNING   carisoprodol (SOMA) 350 MG tablet Take 1 tablet (350 mg total) by mouth 3 (three) times daily as needed. for muscle spams (Patient taking differently: Take 350 mg by mouth 3 (three) times daily as needed for muscle spasms. for muscle spams)   clobetasol ointment (TEMOVATE) 0.05 % Apply 1 application  topically 2 (two) times daily as needed.   clonazePAM (KLONOPIN) 0.5 MG tablet Take 1 tablet (0.5 mg total) by mouth 2 (two) times daily as needed for anxiety.   Cyanocobalamin (B-12) 3000 MCG CAPS Take 1 capsule by mouth daily.   dapsone 25 MG tablet Take 25-100 mg by mouth daily as needed (for dermatitis).    DULoxetine (CYMBALTA) 60 MG capsule TAKE ONE CAPSULE BY MOUTH EVERY EVENING   esomeprazole (NEXIUM) 40 MG capsule Take 40 mg by mouth daily  at 12 noon.   famotidine (PEPCID) 10 MG tablet Take 1 tablet by mouth at bedtime.   fluticasone (FLONASE) 50 MCG/ACT nasal spray Place 1 spray into both nostrils daily.   ibuprofen (ADVIL,MOTRIN) 200 MG tablet Take 800 mg by mouth 2 (two) times daily as needed for headache or moderate pain.   levothyroxine (SYNTHROID) 100 MCG tablet TAKE ONE TABLET BY MOUTH monday-saturday before breakfast and TAKE TWO TABLETS BY MOUTH ONCE WEEKLY BEFORE BREAKFAST ON SUNDAYS (Patient taking differently: Take 100 mcg  by mouth daily before breakfast.)   loratadine (CLARITIN) 10 MG tablet Take 10 mg by mouth daily.     metoprolol tartrate (LOPRESSOR) 25 MG tablet TAKE ONE TABLET BY MOUTH TWICE DAILY   rosuvastatin (CRESTOR) 10 MG tablet TAKE ONE TABLET BY MOUTH EVERYDAY AT BEDTIME (Patient taking differently: Take 10 mg by mouth daily.)   Sod Fluoride-Potassium Nitrate (SODIUM FLUORIDE 5000 SENSITIVE) 1.1-5 % GEL Take 1 Application by mouth 2 (two) times daily.   valACYclovir (VALTREX) 500 MG tablet Take 1-2 tablets (500-1,000 mg total) by mouth daily as needed (for outbreaks).     Allergies:   Iodine, Iohexol, Gabapentin, and Pregabalin   Social History   Socioeconomic History   Marital status: Divorced    Spouse name: Not on file   Number of children: 0   Years of education: Not on file   Highest education level: Not on file  Occupational History   Occupation: Radiographer, therapeutic: [employer]   Occupation: Disability  Tobacco Use   Smoking status: Never   Smokeless tobacco: Never  Vaping Use   Vaping status: Never Used  Substance and Sexual Activity   Alcohol use: Yes    Comment: Very rare   Drug use: No   Sexual activity: Not on file  Other Topics Concern   Not on file  Social History Narrative   Divorced    No children    Living in [place] (12/10)   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 ROS:   Please see the history of present illness.    All 14 point review of systems negative except as described per history of present illness  EKGs/Labs/Other Studies Reviewed:         Recent Labs: 09/04/2022: BNP 12.9; BUN 14; Creatinine, Ser 0.79; Potassium 4.0; Sodium 140 03/22/2023: Hemoglobin 12.3; Platelets 400; TSH 1.020  Recent Lipid Panel    Component Value Date/Time   CHOL 174 08/04/2011 0500   TRIG 54.0 08/04/2011 0500   HDL 39.00 (L) 08/04/2011 0500   CHOLHDL 4 08/04/2011 0500   VLDL 10.8 08/04/2011 0500   LDLCALC 124 (H) 08/04/2011 0500    Physical Exam:    VS:  BP 122/78 (BP Location: Right Arm, Patient Position: Sitting)   Pulse 76   Ht 5\' 4"  (1.626 m)   Wt 189 lb (85.7 kg)   SpO2 92%   BMI 32.44 kg/m     Wt Readings from Last 3 Encounters:  05/06/23 189 lb (85.7 kg)  03/22/23 192 lb (87.1 kg)  02/17/23 193 lb 3.2 oz (87.6 kg)     GEN:  Well nourished, well developed in no acute distress HEENT: Normal NECK: No JVD; No carotid bruits LYMPHATICS: No lymphadenopathy CARDIAC: RRR, no murmurs, no rubs, no gallops RESPIRATORY:  Clear to auscultation without rales, wheezing or rhonchi  ABDOMEN: Soft, non-tender, non-distended MUSCULOSKELETAL:  No  edema; No deformity  SKIN: Warm and dry LOWER EXTREMITIES: no swelling NEUROLOGIC:  Alert and oriented x 3 PSYCHIATRIC:  Normal affect   ASSESSMENT:    1. Essential hypertension   2. Palpitations   3. Prediabetes     PLAN:    In order of problems listed above:  Inappropriate sinus tachycardia post COVID I suspect autonomic instability play significant role here.  I did put 24 hours blood pressure monitor on her will try demonstrated low blood pressure I will completely discontinue amlodipine and see if that helps encouraged her to drink plenty of fluids and be active if this will not be enough you may be forced to increase dose of metoprolol.  She takes 25 twice daily at that time we will switch to long-acting form 75 mg daily. Palpitations plan as described above. Prediabetes managed by antimedicine team.  I encouraged her to be more active.   Medication Adjustments/Labs and Tests Ordered: Current medicines are reviewed at length with the patient today.  Concerns regarding medicines are outlined above.  No orders of the defined types were placed in this encounter.  Medication changes: No orders of the defined types were placed in this encounter.   Signed, Georgeanna Lea, MD, [facility] 05/06/2023 2:08 PM    [facility]

## 2023-05-06 NOTE — Patient Instructions (Addendum)
Medication Instructions:   STOP: Amlodipine   Lab Work: None Ordered If you have labs (blood work) drawn today and your tests are completely normal, you will receive your results only by: MyChart Message (if you have MyChart) OR A paper copy in the mail If you have any lab test that is abnormal or we need to change your treatment, we will call you to review the results.   Testing/Procedures: None Ordered   Follow-Up: At [facility], you and your health needs are our priority.  As part of our continuing mission to provide you with exceptional heart care, we have created designated Provider Care Teams.  These Care Teams include your primary Cardiologist (physician) and Advanced Practice Providers (APPs -  Physician Assistants and Nurse Practitioners) who all work together to provide you with the care you need, when you need it.  We recommend signing up for the patient portal called "MyChart".  Sign up information is provided on this After Visit Summary.  MyChart is used to connect with patients for Virtual Visits (Telemedicine).  Patients are able to view lab/test results, encounter notes, upcoming appointments, etc.  Non-urgent messages can be sent to your provider as well.   To learn more about what you can do with MyChart, go to [email or web address].    Your next appointment:   3 month(s)  The format for your next appointment:   In Person  Provider:   Gypsy Balsam, MD    Other Instructions NA

## 2023-06-23 ENCOUNTER — Other Ambulatory Visit: Payer: Self-pay | Admitting: Internal Medicine

## 2023-06-23 ENCOUNTER — Ambulatory Visit: Payer: PPO | Admitting: Internal Medicine

## 2023-06-23 ENCOUNTER — Encounter: Payer: Self-pay | Admitting: Internal Medicine

## 2023-06-23 VITALS — BP 124/78 | HR 72 | Temp 98.9°F | Resp 18 | Ht 64.0 in | Wt 187.0 lb

## 2023-06-23 DIAGNOSIS — E039 Hypothyroidism, unspecified: Secondary | ICD-10-CM | POA: Diagnosis not present

## 2023-06-23 DIAGNOSIS — Z8669 Personal history of other diseases of the nervous system and sense organs: Secondary | ICD-10-CM

## 2023-06-23 DIAGNOSIS — G4700 Insomnia, unspecified: Secondary | ICD-10-CM

## 2023-06-23 DIAGNOSIS — M797 Fibromyalgia: Secondary | ICD-10-CM | POA: Insufficient documentation

## 2023-06-23 DIAGNOSIS — J4520 Mild intermittent asthma, uncomplicated: Secondary | ICD-10-CM | POA: Diagnosis not present

## 2023-06-23 DIAGNOSIS — M8580 Other specified disorders of bone density and structure, unspecified site: Secondary | ICD-10-CM

## 2023-06-23 DIAGNOSIS — Z0000 Encounter for general adult medical examination without abnormal findings: Secondary | ICD-10-CM

## 2023-06-23 DIAGNOSIS — R7303 Prediabetes: Secondary | ICD-10-CM | POA: Diagnosis not present

## 2023-06-23 DIAGNOSIS — K219 Gastro-esophageal reflux disease without esophagitis: Secondary | ICD-10-CM

## 2023-06-23 DIAGNOSIS — K900 Celiac disease: Secondary | ICD-10-CM

## 2023-06-23 DIAGNOSIS — E7800 Pure hypercholesterolemia, unspecified: Secondary | ICD-10-CM | POA: Diagnosis not present

## 2023-06-23 DIAGNOSIS — M62838 Other muscle spasm: Secondary | ICD-10-CM

## 2023-06-23 DIAGNOSIS — I10 Essential (primary) hypertension: Secondary | ICD-10-CM | POA: Diagnosis not present

## 2023-06-23 DIAGNOSIS — F4310 Post-traumatic stress disorder, unspecified: Secondary | ICD-10-CM

## 2023-06-23 DIAGNOSIS — F411 Generalized anxiety disorder: Secondary | ICD-10-CM

## 2023-06-23 DIAGNOSIS — E6609 Other obesity due to excess calories: Secondary | ICD-10-CM | POA: Insufficient documentation

## 2023-06-23 DIAGNOSIS — L130 Dermatitis herpetiformis: Secondary | ICD-10-CM

## 2023-06-23 DIAGNOSIS — F331 Major depressive disorder, recurrent, moderate: Secondary | ICD-10-CM | POA: Diagnosis not present

## 2023-06-23 DIAGNOSIS — D509 Iron deficiency anemia, unspecified: Secondary | ICD-10-CM

## 2023-06-23 DIAGNOSIS — G43909 Migraine, unspecified, not intractable, without status migrainosus: Secondary | ICD-10-CM | POA: Insufficient documentation

## 2023-06-23 DIAGNOSIS — Z6832 Body mass index (BMI) 32.0-32.9, adult: Secondary | ICD-10-CM | POA: Diagnosis not present

## 2023-06-23 DIAGNOSIS — I4711 Inappropriate sinus tachycardia, so stated: Secondary | ICD-10-CM

## 2023-06-23 DIAGNOSIS — G540 Brachial plexus disorders: Secondary | ICD-10-CM

## 2023-06-23 DIAGNOSIS — E66811 Obesity, class 1: Secondary | ICD-10-CM | POA: Insufficient documentation

## 2023-06-23 DIAGNOSIS — G509 Disorder of trigeminal nerve, unspecified: Secondary | ICD-10-CM

## 2023-06-23 MED ORDER — ROSUVASTATIN CALCIUM 10 MG PO TABS: 10.0000 mg | ORAL_TABLET | Freq: Every evening | ORAL | 1 refills | Status: DC

## 2023-06-23 MED ORDER — CLONAZEPAM 0.5 MG PO TABS: 0.5000 mg | ORAL_TABLET | Freq: Two times a day (BID) | ORAL | 2 refills | Status: DC | PRN

## 2023-06-23 MED ORDER — CARISOPRODOL 350 MG PO TABS: 350.0000 mg | ORAL_TABLET | Freq: Three times a day (TID) | ORAL | 0 refills | Status: DC | PRN

## 2023-06-23 NOTE — Assessment & Plan Note (Signed)
She has not been having problems with this and she is no longer on medications.

## 2023-06-23 NOTE — Assessment & Plan Note (Signed)
She controls this with cymbalta.  We will continue to follow.

## 2023-06-23 NOTE — Assessment & Plan Note (Signed)
We willl recheck her iron studies today.

## 2023-06-23 NOTE — Assessment & Plan Note (Signed)
Plan as below.

## 2023-06-23 NOTE — Assessment & Plan Note (Signed)
Plan as above.  

## 2023-06-23 NOTE — Assessment & Plan Note (Signed)
Health maintenance discussed.  She needs a mammogram and her colonoscopy which we will arrange.  We will obtain some yearly labs.

## 2023-06-23 NOTE — Assessment & Plan Note (Signed)
She uses dapsone as needed for flares.

## 2023-06-23 NOTE — Assessment & Plan Note (Signed)
She seems euthyroid today.  We will check her TFT's. 

## 2023-06-23 NOTE — Assessment & Plan Note (Signed)
This is stable.  She remains on wellbutrin XL at this time.  Her anxiety can be situational.  Her PTSD is stable.  We will continue to follow.

## 2023-06-23 NOTE — Assessment & Plan Note (Signed)
She controls her prediabetes with diet.  We will check her HgBA1c.

## 2023-06-23 NOTE — Assessment & Plan Note (Signed)
Her workup by cardiology and myself is described as above.  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

## 2023-06-23 NOTE — Assessment & Plan Note (Signed)
This is controlled. She will continue on symbicort and use albuterol HFA as needed.

## 2023-06-23 NOTE — Progress Notes (Signed)
 Office Visit  Subjective   Patient ID: Paulett Doser   DOB: 11-Aug-1960   Age: 62 y.o.   MRN: [id number]   Chief Complaint Chief Complaint  Patient presents with   Annual Exam     History of Present Illness Larayne Roffers is a 62 year old Caucasian/White female who presents for her annual health maintenance exam. She is due for the following health maintenance studies: colonoscopy and screening labs. This patient's past medical history Anxiety Disorder, Asthma, Celiac Disease, Depression, Dermatitis Herpetiformis, Fibromyalgia, Hypertension, Hypothyroidism, Posttraumatic stress disorder, and Thoracic outlet syndrome.   Her last eye exam was done in 06/2022 and she denies any problems with her vision.  She had a colonoscopy in 2014 where this was normal.  She is supposed to go back this year for a repeat colonoscopy.   She had a digital screening mammogram on 11/17/2021 and this was normal. She had a total hysterectomy in 1998. The patient does not exercise.  She does not smoke.  She does obtain yearly flu vaccines. She had a pneumovax 23 done in 2016. She had a shingrix vaccine in 09/2016. She has had 3 COVID-19 vaccines including 1 booster. There is no family history of heart disease or stroke. The patient is not on an ASA.   Mrs. Cremeans was admitted to [facility] in 12/2022 due to breaking out with herpatiformis dermatitis where this has been associated with her celiac disease.  The patient began having increased SOB and pain radiation down her left arm for a few days before admission.  She went to the urgent care but was found hypoxic with oxygen sats at home of 83%.  They sent her to the ER where they did a CXR that showed no active cardiopulmonary disease.  They also did a CTA of the chest that showed no acute cardiopulmonary disease and no evidence of PE.  They noted she had some tachycardia but she had told them she has positional tachycardia with heart rate in the 140's with just simple walking as an  outpatient since 08/2022.  She was admitted to [facility] with acute COPD exacerbation with hypoxia.  She was placed on oxygen.  They noted she had no wheezing and did not feel her hypoxia was related to her asthma.  She tells me that she has had problems with anemia with dapsone in the past.  They noted she had anemia with a HgB 10.6 and they did iron studies where her iron was 52 and her ferritin was 19 with a reticulocyte count at 4%.  She was placed on iron and was sent home prednisone and oxygen via [place].  She is now off oxygen.  She is no longer on oral iron.  She denies any problems with breathing today.  Her last iron studies were done in 03/2023 and this showed an iron level of 75 and a HgB of 12.3   The patient is a 62 yo female returns for followup of her depression and anxiety as well as her PTSD.  She is having some increased anxiety due to her recent illness as well as problems with her father's health but she believes this is situational.   Today, she states her depression is moderate but her anxiety is moderate.  She states her insomnia has been doing better.  She stopped taking ambien as it made her cook and shop and not remember.  I did see her in 06/24/2022 for worsening insomnia since her COVID-19 infection.  I had a  discussion with her regarding her soma and clonazepam.  I only wanted her to use clonazepam as needed and not for sleep.  We started her on a trial of ambien but she had to stop this as it made her do things at night she would not remember including shopping and cooking.  I had increased her amitriptyline from 25mg  to 50mg  at night which had helped but she developed dry mouth and eyes and again was weaned off her amitriptyline.  She remains on wellbutryin XL 300mg  daily, cymbalta 60mg  po daily, and clonazepam 0.5mg  po BID prn which she she is using a couple of times per week.  She is no longer seeing a counsellor due to the COVID-19 pandemic.   She denies difficulty concentrating, difficulty  performing routine daily activities, fatigue, helpless feeling, suicidal ideation, loss of appetite, and social withdrawal. This patient feels that she is able to care for herself. She currently lives alone. She has no significant prior history of mental health disorders. She was using amitryptyline for her nerve pain down her left arm from her thoracic outlet syndrome but was also using this for her depression and sleep but again she stopped this.     The patient is a 62 year old Caucasian/White female who returns for a follow-up visit for her prediabetes. On her yearly exam in 04/2021 we noted that she was prediabetic with a HgBA1c of 6%. I asked her to watch her diet and exercise. Since the last visit, there have been no problems. She remains on no medications; the prediabetes is being managed by dietary intervention alone. She is not walking as much as they would like.. She specifically denies unexplained abdominal pain, nausea or vomiting. She does not routinely check blood sugars. She came in fasting today in anticipation of lab work.    The patient is a 62 year old Caucasian/White female who returns for a regularly scheduled thyroid check.  She is currently on levothyroxine Mon-Sat and on Sundays.  She claims to have no symptoms suggestive of thyroid imbalance specifically denying cold intolerance, heat intolerance, tremors, unexplained weight changes.  She was having some dry mouth episodes where she saw her rheumatologist and they ruled out Sjogrens syndrome with negative SSA/SSB antibodies.  I reviewed their notes and they felt her dry mouth was due to her amitriptyline.    The patient is a 62 year old Caucasian/White female who presents for a follow-up evaluation of hypertension.   She had some autonomic instablity since COVID-19 with her BP.  Cardiology has seen her and reduced her amlodipine from 10mg  to 5mg  daily and she last cardiology on 05/06/2023.  They did discontinue her  amlodipine at that time and they are discussing switching her metoprolol to long acting metoprolol.  The patient has  been checking her blood pressure at home.  Her systolic BP has been running 120-130 range.  The patient's current medications include: metoprolol tartrate 25 mg twice daily. The patient has been tolerating her medications well. The patient denies any visual changes, chest pain, shortness of breath, and weakness/numbness. She reports there have been no other symptoms noted.   We ddi see Mrs. Gonser in 10/2022 where she presented for followup of her SOB/chest pressure.  I saw her a few months prior where we ordered a CTA of her chest.  This showed no evidence of pulmonary embolism.  She did followup with cardiology who placed a zio patch on 09/22/2022 and this showed a predominance of  sinus rhythm.  She also underwent a cardiac CT angiogram with calcium scoring on 09/24/2022 and this showed a calcium score of 0 and a normal right dominant arteries.  Again, she was she was admitted to [facility] from 2/8 until 2/9 where she was seen for SOB and chest pain.  She had serial cardiac enzymes that were negative and she has no evidence of EKG changes.  There was no SOB but she continued to have chest pain throughout her hospitalization.  The patient underwent nuclear stress testing where the vasodilator stress test actually made her pain worse however she showed no evidence of reversible ischemia and she had a preserved EF.  There was no evidence of arrythmia seen on telemetry.  They felt her chest pain was like due to musculoskeletal versus anxiety related.  She states that anytime she exerted herself, she would get substernal pressure which she states it goes away with rest.  It can take her 30 min for this pain to go away.  There is no associated SOB but she does get nauseated with it as well as palpitations as well as dizziness.  She no longer has any complaints of chest pain or SOB.  This was probably related to  her autonomic instability.  The patient saw me for her annual exam in 04/2022 where she had a recurrence of her neurogenic cough after experiencing COVDI-19 in 03/2022. Her cough has resolved and has not reoccureed.  The patient does have a history of neurogenic cough where she has been followed by [facility].  She had problems with coughing for 2 years where she had developed neurogenic cough from the whooping cough.  She ended up with a laryngeal nerve block in 02/2021 where her cough resolved at that time.  ENT have advised her to continue with nasal sinus irrigations, Flonase, Nexium, Amitriptyline and wean off of Clonidine at that time.  She had nerve ablation x 2 and a nerve block which did not help.  Again, she did have a left sided laryngeal nerve block done in 02/2021 and this has resolved her chronic cough at that time.  She stopped coughing 1-2 days after the procedure.  She was seen by ENT and pulmonary at [facility] in the past  where they wanted to change her beta blocker to a calcium channel blocker and stop her clonidine.  She again had pertussis and ended up having 3 rib fractures on the left.  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  Dr. Heath Gold from pulmonary did a bronchoscopy in 06/2018 and initially scheduled her to see pulmonary at [facility] since the etiology of her chronic cough was not discovered.  Again, she has a history of muscle spasm located in her left chest and shoulder area.  This has been a chronic ongoing problem since she had her brachial plexus release surgery in 2016 where she had thoracic outlet obstruction. Again, she broke multiple ribs on her left side due to chronic cough where she had pertussis.  She is regularly on soma for her spasms.  Today, she denies any f/c, SOB, wheezing, or reflux problems.    She also was having left eyeball sensitivity which she saw neurology in 04/2021  which they felt was an atypical migraine variant.  She states her migraines since she was a teenager.  These headaches occur on her left side and can last over 24 hours.  There is no auras with it but she does have photophonophobia and her headaches are worsened with exertion.  She  could have a migraines maybe once a month but her last migraine was 56 months ago.  Unfortunately, she continued to have left eye pain with left facial numbness and mild facial tingling where she followed up in 01/2022 with neurology.  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  She saw ophthalmology and neurology arranged a MRI/MRA of her brain on 02/13/2022 that showed an unremarkable brain MRI with and without contrast with no acute intracranial pathology or other finding to explain the patient's left-sided facial symptoms with normal intracranial vasculature. 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  However, the patient states the tingling in her cheek has  resolved and her eye pain resolved.  She maybe has one migraine every other month.  Mrs. Cadieux had problems of vertigo in 2022.  She was also having some headaches which I felt could be vestibular migraines.  She would have headaches that started at the same time as her vertigo.  She would wake up with headaches that were severe.  They normally occur on the left side of her head on her forehead or occipital area and can last for over 24 hours.  The headaches will worsen and extend into her "whole head" where before it was a sharp pain in her left side of her head but then will become a dull aching pain.  She denies any auras but states she gets photophobia and nausea.  She has a history of migraines but has not had a migraine in over 20 years.  These headaches that occur in the  morning are also associated with return of her vertigo.  She states that if she is lying in bed and turns her head, the room will spin.  This also occurs when she gets up in the morning.  She has been doing her home vestibular exercises.  She treats the headaches with ASA, Tylenol and ibuprofen and this does not help.  Again, she began having intermittent episodes of vertigo in 02/2020.  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  They did vestibular testing and noticed she had some vertical nystagmus.  Epley manuevers did help.  I did see her again in 04/2020 where her headaches occurred and she was having double vision.   I did a MRI of her brain on 05/02/2020 which was normal.  We sent her to ENT which was unremarkable.   We also sent her to neurology whom she saw in 06/2020 where they felt that we needed to just give it time and her dizziness should hopefully go away.  He had a discussion about her use of meclizine.  They recommended a possible sleep study. She denied any snoring, frequent awakenings at night, daytime napping at that time and she stated she felt well rested in the morning.  This vertigo did resolve and has not recurred.  Glendola Renderos is a 62 year old Caucasian/White female who returns for a routine follow-up evaluation of her fibromyalgia.  The patient states there has been no change to her pain this past year.  Her pain will worsen if she gets "stressed out".  Again, the patient  will have  flare-up periods of her pain. She has pain that involves her ankles, lower extremities, neck, shoulders, lower back, and upper back. She has been taking duloxetine 60 mg oral capsule,delayed release(DR/EC) daily with no reported side effects.  She has an extensive history of muscle spasms where she has been tried on different medications.  These spasms involve her legs, neck, chest and shoulders.  All but the Soma were ineffective.  She did physical therapy this past year  which helped over the summer.   She was using Soma as needed where she was using it about 4 times per week but with PT she is now using this maybe twice a week.  She has been using advil prn as well for her pain.     Zoye Kerkman returns today for routine followup on her cholesterol. On routine lab testing in 04/2021, we noted her cholesterol was still elevated and I asked her to increase her crestor to 10mg  po qhs. Overall, she states she is doing well and is without any complaints or problems at this time. She specifically denies myalgias and fatigue. She remains on dietary management as well as the following cholesterol lowering medications Crestor oral tablet 10 mg at bedtime (she ran out of this 2 weeks ago). She is fasting in anticipation for labs today.    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  She states that this regimen is usually effective for management of her symptoms.   The patient, who is a post-menopausal female, returns for followup of her osteopenia. She has a history of a total abdominal hystrectomy where she was on premarin for about 2 years and stopped it at age 81. She has a history of a fracture of a bone in her leg, a fracture of a bone in her arm, and rib fractures. She reports a family history of osteoporosis. The following risk factors are noted: high caffeine intake and daily prednisone where she had "autoimmune disorder" where she was on prednisone for connective tissue disease. She denies the following: smoking, diabetes mellitus, alcohol consumption of more that 7 ounces per week, hyperthyroidism, surgical resection of her bowel, and surgical resection of her stomach. She states she does not exercise routinely. A bone mineral density study was performed 02/2016 where she scored a t-score of -1.7.  She had a repeat bone density done in 02/2018 which showed a t-score of -1.9 where she has osteopenia.  She does have a history of Vit D  defiency and she remains on supplementation at this time.  The patient is a 62 year old Caucasian/White female who returns with a history of asthma.  She remains on Symbicort at this time.  She is no longer on singulair.  She is on albuterol HFA as needed where she does not need to use this often.   The symptoms are aggravated by exposure to allergens and exposure to cold air. She is reportedly allergic to pollens and dust. The onset of symptoms varies from sudden to gradual and the symptoms tend to be intermittent. There are no additional complaints. Symptoms denied: chronic night cough, shortness of breath, increased respiratory rate, and chest tightness. Her past medical history is notable for a history of allergic rhinoconjunctivitis.  She remains on zyrtec and flonase.  The patient has a history of autoimmune disease where GI told her she had celiac disease which was diagnosed at around age 62.  They endoscopy and the patient tells  me that she had positive biopsies and blood work.  She does follow a strict gluten free diet which does help her as well for her Dermatitis Herpetiformis.  She was on Dapsone when she has a flareup of her dermatitis but this is rare but she has anemia as discussed above and she has not been using. She has no current symptoms at this time.                 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   Disorder of skin or subcutaneous tissue 07/11/2010   Qualifier: Diagnosis of   By:  Milinda Antis MD, Colon Flattery     IMO SNOMED Dx Update Oct 2024     DOE (dyspnea on exertion) 10/26/2022   Dry eye syndrome of both eyes 06/24/2020   Dysphonia 03/08/2019   Dyspnea 08/26/2022   Essential hypertension 03/22/2023   Fatigue 08/26/2011   Fibromyalgia    Fibula fracture    GAD (generalized anxiety disorder) 09/21/2007   Sees psychiatrist - has had inpt tx         GERD 05/20/2007   Qualifier: Diagnosis of   By: Milinda Antis MD, Colon Flattery        GERD (gastroesophageal reflux disease)    Headache    Hemorrhoids 05/20/2007   Qualifier: Diagnosis of   By: Milinda Antis MD, Colon Flattery     IMO SNOMED Dx Update Oct 2024     Herpes simplex virus (HSV) infection 05/20/2007   IMO SNOMED Dx Update Oct 2024     History of asthma 03/08/2019   History of balance disorder 12/04/2011   HLD (hyperlipidemia)    Hx of migraines    Hypertension    Hypothyroidism    Hypoxia 01/20/2023   Insomnia 06/24/2022   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   Joint pain 08/26/2011   Seeing Dr Jon Billings for fibromyalgia and possible SLE or other autoimmune variant  Also bursitis of hips and some OA     Laryngospasms 03/08/2019   Leg pain 12/04/2011   Lupus    Mild episode of recurrent major depressive disorder (HCC) 07/24/2022   Moderate episode of recurrent major depressive disorder (HCC) 03/22/2023   Myalgia and myositis, unspecified    Neck pain 12/04/2011   Numbness 03/08/2014   Other B-complex deficiencies    Palpitations 07/24/2022   Prediabetes 03/22/2023   PTSD (post-traumatic stress disorder)    Recurrent HSV (herpes simplex virus)    Sacroiliitis (HCC) 05/26/2012   Shoulder impingement syndrome, left 10/22/2021   Strain of left trapezius muscle 04/10/2014   Thoracic outlet syndrome    Thoracic outlet syndrome of left thoracic outlet 05/22/2015   Thyroiditis    Thyrotoxicosis 05/20/2007   Qualifier: Diagnosis of   By: Milinda Antis MD, Colon Flattery     IMO SNOMED Dx Update Oct 2024     Tinea corporis 08/04/2011  Unspecified hemorrhoids without mention of complication    Varicose veins of bilateral lower extremities with other complications 07/11/2010   Qualifier: Diagnosis of   By: Milinda Antis MD, Colon Flattery     IMO SNOMED Dx Update Oct 2024     Varicose veins of lower extremities with other complications    Vertigo    Vitreous floater, bilateral 06/24/2020     Allergies Allergies  Allergen Reactions   Iodine Other (See Comments)    REACTION: SOB with injectable iodine   Iohexol Shortness Of Breath   Gabapentin    Pregabalin Other (See Comments)    Altered mental status     Medications  Current Outpatient Medications:    acetaminophen (TYLENOL) 500 MG tablet, Take 500 mg by mouth every 6 (six) hours as needed for mild pain or moderate pain., Disp: , Rfl:    budesonide-formoterol (SYMBICORT) 160-4.5 MCG/ACT inhaler, Inhale 2 puffs into the lungs at bedtime., Disp: , Rfl:    buPROPion (WELLBUTRIN XL) 300 MG 24 hr tablet, TAKE ONE TABLET BY MOUTH EVERY MORNING, Disp: 90 tablet, Rfl: 1   carisoprodol (SOMA) 350 MG tablet, Take 1 tablet (350 mg total) by mouth 3 (three) times daily as needed. for muscle spams (Patient taking differently: Take 350 mg by mouth 3 (three) times daily as needed for muscle spasms. for muscle spams), Disp: 90 tablet, Rfl: 0   clobetasol ointment (TEMOVATE) 0.05 %, Apply 1 application  topically 2 (two) times daily as needed., Disp: , Rfl:    clonazePAM (KLONOPIN) 0.5 MG tablet, Take 1 tablet (0.5 mg total) by mouth 2 (two) times daily as needed for anxiety., Disp: 60 tablet, Rfl: 2   Cyanocobalamin (B-12) 3000 MCG CAPS, Take 1 capsule by mouth daily., Disp: , Rfl:    dapsone 25 MG tablet, Take 25-100 mg by mouth daily as needed (for dermatitis). , Disp: , Rfl:    DULoxetine (CYMBALTA) 60 MG capsule, TAKE ONE CAPSULE BY MOUTH EVERY EVENING, Disp: 90 capsule, Rfl: 1   esomeprazole (NEXIUM) 40 MG capsule, Take 40 mg by mouth daily at 12 noon., Disp: , Rfl:    famotidine (PEPCID)  10 MG tablet, Take 1 tablet by mouth at bedtime., Disp: , Rfl:    fluticasone (FLONASE) 50 MCG/ACT nasal spray, Place 1 spray into both nostrils daily., Disp: , Rfl:    ibuprofen (ADVIL,MOTRIN) 200 MG tablet, Take 800 mg by mouth 2 (two) times daily as needed for headache or moderate pain., Disp: , Rfl:    levothyroxine (SYNTHROID) 100 MCG tablet, TAKE ONE TABLET BY MOUTH monday-saturday before breakfast and TAKE TWO TABLETS BY MOUTH ONCE WEEKLY BEFORE BREAKFAST ON SUNDAYS (Patient taking differently: Take 100 mcg by mouth daily before breakfast.), Disp: 90 tablet, Rfl: 1   loratadine (CLARITIN) 10 MG tablet, Take 10 mg by mouth daily.  , Disp: , Rfl:    metoprolol tartrate (LOPRESSOR) 25 MG tablet, TAKE ONE TABLET BY MOUTH TWICE DAILY, Disp: 180 tablet, Rfl: 1   rosuvastatin (CRESTOR) 10 MG tablet, TAKE ONE TABLET BY MOUTH EVERYDAY AT BEDTIME (Patient taking differently: Take 10 mg by mouth daily.), Disp: 90 tablet, Rfl: 1   Sod Fluoride-Potassium Nitrate (SODIUM FLUORIDE 5000 SENSITIVE) 1.1-5 % GEL, Take 1 Application by mouth 2 (two) times daily., Disp: , Rfl:    valACYclovir (VALTREX) 500 MG tablet, Take 1-2 tablets (500-1,000 mg total) by mouth daily as needed (for outbreaks)., Disp: 30 tablet, Rfl: 1   Review of Systems Review of Systems  Constitutional:  Negative for chills, fever and malaise/fatigue.  Eyes:  Negative for blurred vision and double vision.  Respiratory:  Negative for cough, sputum production, shortness of breath and wheezing.   Cardiovascular:  Negative for chest pain, palpitations and leg swelling.  Gastrointestinal:  Positive for heartburn. Negative for abdominal pain, blood in stool, constipation, diarrhea, melena, nausea and vomiting.  Genitourinary:  Negative for frequency and hematuria.  Musculoskeletal:  Negative for myalgias.  Skin:  Negative for itching and rash.  Neurological:  Positive for headaches. Negative for dizziness and weakness.  Endo/Heme/Allergies:   Negative for polydipsia.       Objective:    Vitals BP 124/78   Pulse 72   Temp 98.9 F (37.2 C)   Resp 18   Ht 5\' 4"  (1.626 m)   Wt 187 lb (84.8 kg)   SpO2 96%   BMI 32.10 kg/m    Physical Examination Physical Exam Constitutional:      Appearance: Normal appearance. She is not ill-appearing.  HENT:     Head: Normocephalic and atraumatic.     Right Ear: Tympanic membrane, ear canal and external ear normal.     Left Ear: Tympanic membrane, ear canal and external ear normal.     Nose: Nose normal. No congestion or rhinorrhea.     Mouth/Throat:     Mouth: Mucous membranes are moist.     Pharynx: Oropharynx is clear. No oropharyngeal exudate or posterior oropharyngeal erythema.  Eyes:     General: No scleral icterus.    Conjunctiva/sclera: Conjunctivae normal.     Pupils: Pupils are equal, round, and reactive to light.  Cardiovascular:     Rate and Rhythm: Normal rate and regular rhythm.     Pulses: Normal pulses.     Heart sounds: No murmur heard.    No friction rub. No gallop.  Pulmonary:     Effort: Pulmonary effort is normal. No respiratory distress.     Breath sounds: No wheezing, rhonchi or rales.  Abdominal:     General: Bowel sounds are normal. There is no distension.     Palpations: Abdomen is soft.     Tenderness: There is no abdominal tenderness.  Musculoskeletal:     Cervical back: Neck supple. No tenderness.     Right lower leg: No edema.     Left lower leg: No edema.  Lymphadenopathy:     Cervical: No cervical adenopathy.  Skin:    General: Skin is warm and dry.     Findings: No rash.  Neurological:     General: No focal deficit present.     Mental Status: She is alert and oriented to person, place, and time.  Psychiatric:        Mood and Affect: Mood normal.        Behavior: Behavior normal.        Assessment & Plan:   Inappropriate sinus tachycardia (HCC) Her workup by cardiology and myself is described as above.  Cardiology is looking  to place her possibly on long acting metoprolol but her autonomic dysfunction with tachycardia has improved over this past year.  Essential hypertension Her BP is controlled.  She is now off her amlodipine.  She will continue with her metoprolol.  Episodic migraine She is no longer on preventative medications as neurology took her off topamax.  She can use nurtec as needed for migraines.  Asthma This is controlled. She will continue on symbicort and use albuterol HFA as needed.  GERD She can  take her pepcid at night and nexium in the AM.  Celiac disease She is controlling her celiac disease with her diet.  Hypothyroidism She seems euthyroid today.  We will check her TFT's.  Trigeminal neuropathy The patient never had to start medications for her neuropathy as the symptoms resolved.  Osteopenia Continue calcium and vit D and I want her to do weight bearing exercises like walking.  FIBROMYALGIA She controls this with cymbalta.  We will continue to follow.  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  Thoracic outlet syndrome of left thoracic outlet She has not been having problems with this and she is no longer on medications.  PTSD (post-traumatic stress disorder) Plan as below.  Prediabetes She controls her prediabetes with diet.  We will check her HgBA1c.  Moderate episode of recurrent major depressive disorder (HCC) This is stable.  She remains on wellbutrin XL at this time.  Her anxiety can be situational.  Her PTSD is stable.  We will continue to follow.  Insomnia This has improved over the interim as well.  Hypercholesterolemia She stopped taking her crestor 2 weeks ago.  We will recheck her FLP today.  GAD (generalized anxiety disorder) Plan as above.  Class 1 obesity due to excess calories with serious comorbidity and body mass index (BMI) of 32.0 to 32.9 in adult She states she is going to try to go back to water aerobics for exercise.   She has lost some weight as she is watching her diet and eating healthy.  BMI 32.0-32.9,adult Plan as above.  Anemia We willl recheck her iron studies today.  Annual physical exam Health maintenance discussed.  She needs a mammogram and her colonoscopy which we will arrange.  We will obtain some yearly labs.    No follow-ups on file.   Crist Fat, MD

## 2023-06-23 NOTE — Assessment & Plan Note (Signed)
She is controlling her celiac disease with her diet.

## 2023-06-23 NOTE — Assessment & Plan Note (Signed)
Her BP is controlled.  She is now off her amlodipine.  She will continue with her metoprolol.

## 2023-06-23 NOTE — Assessment & Plan Note (Signed)
This has improved over the interim as well.

## 2023-06-23 NOTE — Assessment & Plan Note (Signed)
She stopped taking her crestor 2 weeks ago.  We will recheck her FLP today.

## 2023-06-23 NOTE — Assessment & Plan Note (Signed)
The patient never had to start medications for her neuropathy as the symptoms resolved.

## 2023-06-23 NOTE — Assessment & Plan Note (Signed)
She is no longer on preventative medications as neurology took her off topamax.  She can use nurtec as needed for migraines.

## 2023-06-23 NOTE — Assessment & Plan Note (Signed)
Continue calcium and vit D and I want her to do weight bearing exercises like walking.

## 2023-06-23 NOTE — Assessment & Plan Note (Signed)
She states she is going to try to go back to water aerobics for exercise.  She has lost some weight as she is watching her diet and eating healthy.

## 2023-06-23 NOTE — Assessment & Plan Note (Signed)
She can take her pepcid at night and nexium in the AM.

## 2023-06-24 LAB — CMP14 + ANION GAP
ALT: 13 [IU]/L (ref 0–32)
AST: 14 [IU]/L (ref 0–40)
Albumin: 4.2 g/dL (ref 3.8–4.9)
Alkaline Phosphatase: 122 [IU]/L — ABNORMAL HIGH (ref 44–121)
Anion Gap: 12 mmol/L (ref 10.0–18.0)
BUN/Creatinine Ratio: 20 (ref 12–28)
BUN: 19 mg/dL (ref 8–27)
Bilirubin Total: <0.2 mg/dL (ref 0.0–1.2)
CO2: 24 mmol/L (ref 20–29)
Calcium: 9.7 mg/dL (ref 8.7–10.3)
Chloride: 99 mmol/L (ref 96–106)
Creatinine, Ser: 0.95 mg/dL (ref 0.57–1.00)
Globulin, Total: 2.9 g/dL (ref 1.5–4.5)
Glucose: 111 mg/dL — ABNORMAL HIGH (ref 70–99)
Potassium: 4.8 mmol/L (ref 3.5–5.2)
Sodium: 135 mmol/L (ref 134–144)
Total Protein: 7.1 g/dL (ref 6.0–8.5)
eGFR: 69 mL/min/{1.73_m2} (ref >59)

## 2023-06-24 LAB — CBC WITH DIFFERENTIAL/PLATELET
Basophils Absolute: 0.1 10*3/uL (ref 0.0–0.2)
Basos: 1 %
EOS (ABSOLUTE): 0 10*3/uL (ref 0.0–0.4)
Eos: 0 %
Hematocrit: 42.1 % (ref 34.0–46.6)
Hemoglobin: 13.4 g/dL (ref 11.1–15.9)
Immature Grans (Abs): 0 10*3/uL (ref 0.0–0.1)
Immature Granulocytes: 1 %
Lymphocytes Absolute: 2.1 10*3/uL (ref 0.7–3.1)
Lymphs: 30 %
MCH: 28 pg (ref 26.6–33.0)
MCHC: 31.8 g/dL (ref 31.5–35.7)
MCV: 88 fL (ref 79–97)
Monocytes Absolute: 0.6 10*3/uL (ref 0.1–0.9)
Monocytes: 9 %
Neutrophils Absolute: 4 10*3/uL (ref 1.4–7.0)
Neutrophils: 59 %
Platelets: 408 10*3/uL (ref 150–450)
RBC: 4.79 x10E6/uL (ref 3.77–5.28)
RDW: 15 % (ref 11.7–15.4)
WBC: 6.8 10*3/uL (ref 3.4–10.8)

## 2023-06-24 LAB — LIPID PANEL
Chol/HDL Ratio: 7.7 {ratio} — ABNORMAL HIGH (ref 0.0–4.4)
Cholesterol, Total: 316 mg/dL — ABNORMAL HIGH (ref 100–199)
HDL: 41 mg/dL (ref >39)
LDL Chol Calc (NIH): 238 mg/dL — ABNORMAL HIGH (ref 0–99)
Triglycerides: 185 mg/dL — ABNORMAL HIGH (ref 0–149)
VLDL Cholesterol Cal: 37 mg/dL (ref 5–40)

## 2023-06-24 LAB — HEMOGLOBIN A1C
Est. average glucose Bld gHb Est-mCnc: 123 mg/dL
Hgb A1c MFr Bld: 5.9 % — ABNORMAL HIGH (ref 4.8–5.6)

## 2023-06-24 LAB — TSH: TSH: 3.79 u[IU]/mL (ref 0.450–4.500)

## 2023-06-24 LAB — IRON: Iron: 44 ug/dL (ref 27–159)

## 2023-06-24 LAB — T4, FREE: Free T4: 0.98 ng/dL (ref 0.82–1.77)

## 2023-06-24 LAB — FERRITIN: Ferritin: 12 ng/mL — ABNORMAL LOW (ref 15–150)

## 2023-06-29 ENCOUNTER — Other Ambulatory Visit: Payer: Self-pay

## 2023-06-29 MED ORDER — ROSUVASTATIN CALCIUM 10 MG PO TABS: 20.0000 mg | ORAL_TABLET | Freq: Every evening | ORAL | 1 refills | Status: DC

## 2023-06-29 NOTE — Progress Notes (Signed)
Rx Change from 10- to 20 mg Crestor

## 2023-06-29 NOTE — Progress Notes (Signed)
Her labs look good but her cholestgerol is not under control. She needs to increase her crestor to 20mg  at bedtime  Patient is awre

## 2023-07-28 ENCOUNTER — Other Ambulatory Visit: Payer: Self-pay | Admitting: Internal Medicine

## 2023-07-29 ENCOUNTER — Other Ambulatory Visit: Payer: Self-pay

## 2023-07-29 MED ORDER — DULOXETINE HCL 60 MG PO CPEP: 60.0000 mg | ORAL_CAPSULE | Freq: Every evening | ORAL | 1 refills | Status: DC

## 2023-07-29 MED ORDER — METOPROLOL TARTRATE 25 MG PO TABS: 25.0000 mg | ORAL_TABLET | Freq: Two times a day (BID) | ORAL | 1 refills | Status: DC

## 2023-07-29 MED ORDER — BUPROPION HCL ER (XL) 300 MG PO TB24: 300.0000 mg | ORAL_TABLET | Freq: Every morning | ORAL | 1 refills | Status: DC

## 2023-07-30 ENCOUNTER — Other Ambulatory Visit: Payer: Self-pay | Admitting: Internal Medicine

## 2023-07-30 MED ORDER — CARISOPRODOL 350 MG PO TABS: 350.0000 mg | ORAL_TABLET | Freq: Three times a day (TID) | ORAL | 2 refills | Status: DC | PRN

## 2023-08-03 ENCOUNTER — Other Ambulatory Visit: Payer: Self-pay | Admitting: Internal Medicine

## 2023-08-03 MED ORDER — FLUTICASONE-SALMETEROL 250-50 MCG/ACT IN AEPB: 1.0000 | INHALATION_SPRAY | Freq: Two times a day (BID) | RESPIRATORY_TRACT | 5 refills | Status: DC

## 2023-08-06 ENCOUNTER — Encounter: Payer: Self-pay | Admitting: Cardiology

## 2023-08-06 ENCOUNTER — Ambulatory Visit: Payer: PPO | Attending: Cardiology | Admitting: Cardiology

## 2023-08-06 VITALS — BP 134/72 | HR 84 | Ht 64.0 in | Wt 190.0 lb

## 2023-08-06 DIAGNOSIS — E7800 Pure hypercholesterolemia, unspecified: Secondary | ICD-10-CM | POA: Diagnosis not present

## 2023-08-06 DIAGNOSIS — I4711 Inappropriate sinus tachycardia, so stated: Secondary | ICD-10-CM | POA: Diagnosis not present

## 2023-08-06 DIAGNOSIS — I10 Essential (primary) hypertension: Secondary | ICD-10-CM

## 2023-08-06 DIAGNOSIS — E039 Hypothyroidism, unspecified: Secondary | ICD-10-CM | POA: Diagnosis not present

## 2023-08-06 NOTE — Patient Instructions (Signed)
Medication Instructions:  Your physician recommends that you continue on your current medications as directed. Please refer to the Current Medication list given to you today.  *If you need a refill on your cardiac medications before your next appointment, please call your pharmacy*   Lab Work: None Ordered If you have labs (blood work) drawn today and your tests are completely normal, you will receive your results only by: MyChart Message (if you have MyChart) OR A paper copy in the mail If you have any lab test that is abnormal or we need to change your treatment, we will call you to review the results.   Testing/Procedures: None Ordered   Follow-Up: At [facility], you and your health needs are our priority.  As part of our continuing mission to provide you with exceptional heart care, we have created designated Provider Care Teams.  These Care Teams include your primary Cardiologist (physician) and Advanced Practice Providers (APPs -  Physician Assistants and Nurse Practitioners) who all work together to provide you with the care you need, when you need it.  We recommend signing up for the patient portal called "MyChart".  Sign up information is provided on this After Visit Summary.  MyChart is used to connect with patients for Virtual Visits (Telemedicine).  Patients are able to view lab/test results, encounter notes, upcoming appointments, etc.  Non-urgent messages can be sent to your provider as well.   To learn more about what you can do with MyChart, go to [email or web address].    Your next appointment:   6 month(s)  The format for your next appointment:   In Person  Provider:   Gypsy Balsam, MD    Other Instructions NA

## 2023-08-06 NOTE — Progress Notes (Signed)
Cardiology Office Note:    Date:  08/06/2023   ID:  Morgan [name], DOB 1960/09/12, MRN [id number]  PCP:  Crist Fat, MD  Cardiologist:  Gypsy Balsam, MD    Referring MD: Leonia Reader, Barbara Cower, MD   Chief Complaint  Patient presents with   Hypertension    History of Present Illness:    Morgan [name] is a 63 y.o. female past medical history significant for prior fibromyalgia, GERD, essential hypertension, hyperlipidemia, depression, fatigue, inappropriate sinus tachycardia.  Everything started after COVID since February 2023 she complained of having palpitations which she mean by that spitting up heart rate as well as fatigue tiredness cardiac evaluation included echocardiogram which showed preserved ejection fraction, coronary calcium score was 0 coronary arteries were normal she was noted to have low blood pressure and amlodipine has been discontinued. She comes today to months for follow-up she says she is feeling better she described the fact she feels 50% better but there is a lot of issue with the family she take care of her sick mother as well as her father who required dialysis 3 times a week and she is very stressed out about that.  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   Allergy    seasonal   Anemia    Anxiety    Arthritis    Asthma    Asthma 05/20/2007   Qualifier: Diagnosis of   By: Milinda Antis MD, Colon Flattery     IMO SNOMED Dx Update Oct 2024     Back pain 12/04/2011   Carpal tunnel syndrome of left wrist 12/03/2021   Celiac disease    Chest pain 08/26/2022   Chronic cough 03/08/2019   Connective tissue disorder (HCC) 08/17/2014   Depression    Dermatitis herpetiformis 08/26/2011   Disorder of skin or subcutaneous tissue 07/11/2010   Qualifier: Diagnosis of   By: Milinda Antis MD, Colon Flattery     IMO SNOMED Dx Update Oct 2024     DOE (dyspnea on exertion) 10/26/2022   Dry eye syndrome of both eyes 06/24/2020   Dysphonia 03/08/2019    Dyspnea 08/26/2022   Essential hypertension 03/22/2023   Fatigue 08/26/2011   Fibromyalgia    Fibula fracture    GAD (generalized anxiety disorder) 09/21/2007   Sees psychiatrist - has had inpt tx         GERD 05/20/2007   Qualifier: Diagnosis of   By: Milinda Antis MD, Colon Flattery        GERD (gastroesophageal reflux disease)    Headache    Hemorrhoids 05/20/2007   Qualifier: Diagnosis of   By: Milinda Antis MD, Colon Flattery     IMO SNOMED Dx Update Oct 2024     Herpes simplex virus (HSV) infection 05/20/2007   IMO SNOMED Dx Update Oct 2024     History of asthma 03/08/2019   History of balance disorder 12/04/2011   HLD (hyperlipidemia)    Hx of migraines    Hypertension    Hypothyroidism    Hypoxia 01/20/2023   Insomnia 06/24/2022   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   Joint pain 08/26/2011   Seeing Dr Jon Billings for fibromyalgia and possible SLE or other autoimmune variant  Also bursitis of hips and some OA     Laryngospasms 03/08/2019   Leg pain 12/04/2011   Lupus    Mild episode of recurrent major depressive disorder (HCC) 07/24/2022   Moderate episode of recurrent major depressive disorder (HCC) 03/22/2023   Myalgia and  myositis, unspecified    Neck pain 12/04/2011   Numbness 03/08/2014   Other B-complex deficiencies    Palpitations 07/24/2022   Prediabetes 03/22/2023   PTSD (post-traumatic stress disorder)    Recurrent HSV (herpes simplex virus)    Sacroiliitis (HCC) 05/26/2012   Shoulder impingement syndrome, left 10/22/2021   Strain of left trapezius muscle 04/10/2014   Thoracic outlet syndrome    Thoracic outlet syndrome of left thoracic outlet 05/22/2015   Thyroiditis    Thyrotoxicosis 05/20/2007   Qualifier: Diagnosis of   By: Milinda Antis MD, Colon Flattery     IMO SNOMED Dx Update Oct 2024     Tinea corporis 08/04/2011   Unspecified hemorrhoids without mention of complication    Varicose veins of bilateral lower extremities with other complications 07/11/2010   Qualifier: Diagnosis of   By:  Milinda Antis MD, Colon Flattery     IMO SNOMED Dx Update Oct 2024     Varicose veins of lower extremities with other complications    Vertigo    Vitreous floater, bilateral 06/24/2020    Past Surgical History:  Procedure Laterality Date   ABDOMINAL HYSTERECTOMY  1998   COLONOSCOPY  05/14/1999   biopsy negative   ESOPHAGOGASTRODUODENOSCOPY  07/13/2002   normal   FIBULA FRACTURE SURGERY     MANDIBLE SURGERY     NASAL SINUS SURGERY     OTHER SURGICAL HISTORY     laser surgery for endometriosis   TOTAL VAGINAL HYSTERECTOMY  04/12/1997   VENOGRAM N/A 03/06/2014   Procedure: VENOGRAM;  Surgeon: Fransisco Hertz, MD;  Location: [facility] CATH LAB;  Service: Cardiovascular;  Laterality: N/A;    Current Medications: Current Meds  Medication Sig   acetaminophen (TYLENOL) 500 MG tablet Take 500 mg by mouth every 6 (six) hours as needed for mild pain or moderate pain.   buPROPion (WELLBUTRIN XL) 300 MG 24 hr tablet Take 1 tablet (300 mg total) by mouth every morning.   carisoprodol (SOMA) 350 MG tablet Take 1 tablet (350 mg total) by mouth 3 (three) times daily as needed for muscle spasms.   clobetasol ointment (TEMOVATE) 0.05 % Apply 1 application  topically 2 (two) times daily as needed (Rash).   clonazePAM (KLONOPIN) 0.5 MG tablet Take 1 tablet (0.5 mg total) by mouth 2 (two) times daily as needed for anxiety.   Cyanocobalamin (B-12) 3000 MCG CAPS Take 1 capsule by mouth daily.   dapsone 25 MG tablet Take 25-100 mg by mouth daily as needed (for dermatitis).    DULoxetine (CYMBALTA) 60 MG capsule Take 1 capsule (60 mg total) by mouth every evening.   esomeprazole (NEXIUM) 40 MG capsule Take 40 mg by mouth daily at 12 noon.   famotidine (PEPCID) 10 MG tablet Take 1 tablet by mouth at bedtime.   fluticasone (FLONASE) 50 MCG/ACT nasal spray Place 1 spray into both nostrils daily.   fluticasone-salmeterol (ADVAIR DISKUS) 250-50 MCG/ACT AEPB Inhale 1 puff into the lungs in the morning and at bedtime.   ibuprofen  (ADVIL,MOTRIN) 200 MG tablet Take 800 mg by mouth 2 (two) times daily as needed for headache or moderate pain.   levothyroxine (SYNTHROID) 100 MCG tablet TAKE ONE TABLET BY MOUTH monday-saturday before breakfast and TAKE TWO TABLETS BY MOUTH ONCE WEEKLY BEFORE BREAKFAST ON SUNDAYS (Patient taking differently: Take 100 mcg by mouth daily before breakfast.)   loratadine (CLARITIN) 10 MG tablet Take 10 mg by mouth daily.     metoprolol tartrate (LOPRESSOR) 25 MG tablet Take 1 tablet (  25 mg total) by mouth 2 (two) times daily.   rosuvastatin (CRESTOR) 10 MG tablet Take 2 tablets (20 mg total) by mouth at bedtime.   Sod Fluoride-Potassium Nitrate (SODIUM FLUORIDE 5000 SENSITIVE) 1.1-5 % GEL Take 1 Application by mouth 2 (two) times daily.   valACYclovir (VALTREX) 500 MG tablet Take 1-2 tablets (500-1,000 mg total) by mouth daily as needed (for outbreaks).     Allergies:   Iodine, Iohexol, Gabapentin, and Pregabalin   Social History   Socioeconomic History   Marital status: Divorced    Spouse name: Not on file   Number of children: 0   Years of education: Not on file   Highest education level: Not on file  Occupational History   Occupation: Radiographer, therapeutic: [employer]   Occupation: Disability  Tobacco Use   Smoking status: Never   Smokeless tobacco: Never  Vaping Use   Vaping status: Never Used  Substance and Sexual Activity   Alcohol use: Yes    Comment: Very rare   Drug use: No   Sexual activity: Not on file  Other Topics Concern   Not on file  Social History Narrative   Divorced    No children    Living in [place] (12/10)   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 ROS:   Please see the history of present illness.    All 14 point review of systems negative except as described per history of present illness  EKGs/Labs/Other Studies Reviewed:         Recent Labs: 09/04/2022: BNP 12.9 06/23/2023: ALT 13; BUN 19; Creatinine, Ser 0.95; Hemoglobin 13.4; Platelets 408; Potassium 4.8; Sodium 135; TSH 3.790  Recent Lipid Panel    Component Value Date/Time   CHOL 316 (H) 06/23/2023 0947   TRIG 185 (H) 06/23/2023 0947   HDL 41 06/23/2023 0947   CHOLHDL 7.7 (H) 06/23/2023 0947   CHOLHDL 4 08/04/2011 0500   VLDL 10.8 08/04/2011 0500   LDLCALC 238 (H) 06/23/2023 0947    Physical Exam:    VS:  BP 134/72 (BP Location: Left Arm, Patient Position: Sitting)   Pulse 84   Ht 5\' 4"  (1.626 m)   Wt 190 lb (86.2 kg)   SpO2 97%   BMI 32.61 kg/m     Wt Readings from Last 3 Encounters:  08/06/23 190 lb (86.2 kg)  06/23/23 187 lb (84.8 kg)  05/06/23 189 lb (85.7 kg)     GEN:  Well nourished, well developed in no acute distress HEENT: Normal NECK: No JVD; No carotid bruits LYMPHATICS: No lymphadenopathy CARDIAC: RRR, no murmurs, no rubs, no gallops RESPIRATORY:  Clear to auscultation without rales, wheezing or rhonchi  ABDOMEN: Soft, non-tender, non-distended MUSCULOSKELETAL:  No edema; No deformity  SKIN: Warm and dry LOWER EXTREMITIES: no swelling NEUROLOGIC:  Alert and oriented x 3 PSYCHIATRIC:  Normal affect  ASSESSMENT:    1. Inappropriate sinus tachycardia (HCC)   2. Essential hypertension   3. Acquired hypothyroidism   4. Hypercholesterolemia    PLAN:    In order of problems listed above:  Weakness fatigue tiredness inappropriate sinus tachycardia I think we  dealing with some autonomic instability after COVID likely things are getting better I will not change any medications today. Essential hypertension blood pressure still creeping up now to the point that medication need to be started but schedule follow-up for blood pressure on the regular basis send results to me. Acquired hypothyroidism that being managed by primary care physician last TSH I see is from December 11 of last year 3.79 good level of TSH. Dyslipidemia she stopped taking her Crestor for about a month after that LDL 138 HDL 41 clearly unacceptable she is back on Crestor we will wait for results of the fasting lipid profile    Medication Adjustments/Labs and Tests Ordered: Current medicines are reviewed at length with the patient today.  Concerns regarding medicines are outlined above.  No orders of the defined types were placed in this encounter.  Medication changes: No orders of the defined types were placed in this encounter.   Signed, Georgeanna Lea, MD, [facility] 08/06/2023 1:40 PM    [facility]

## 2023-09-14 ENCOUNTER — Other Ambulatory Visit (HOSPITAL_BASED_OUTPATIENT_CLINIC_OR_DEPARTMENT_OTHER): Payer: Self-pay

## 2023-09-14 ENCOUNTER — Ambulatory Visit (HOSPITAL_BASED_OUTPATIENT_CLINIC_OR_DEPARTMENT_OTHER)
Admission: RE | Admit: 2023-09-14 | Discharge: 2023-09-14 | Disposition: A | Discharge status: Home / Self Care | Source: Ambulatory Visit | Attending: Family Medicine | Admitting: Family Medicine

## 2023-09-14 ENCOUNTER — Encounter (HOSPITAL_BASED_OUTPATIENT_CLINIC_OR_DEPARTMENT_OTHER): Payer: Self-pay

## 2023-09-14 VITALS — BP 146/74 | HR 66 | Temp 98.3°F | Resp 18

## 2023-09-14 DIAGNOSIS — Z87898 Personal history of other specified conditions: Secondary | ICD-10-CM | POA: Diagnosis not present

## 2023-09-14 DIAGNOSIS — J4531 Mild persistent asthma with (acute) exacerbation: Secondary | ICD-10-CM | POA: Diagnosis not present

## 2023-09-14 DIAGNOSIS — U071 COVID-19: Secondary | ICD-10-CM

## 2023-09-14 MED ORDER — PROMETHAZINE-DM 6.25-15 MG/5ML PO SYRP
5.0000 mL | ORAL_SOLUTION | Freq: Four times a day (QID) | ORAL | 0 refills | Status: DC | PRN
  Filled 2023-09-14: qty 118, 6d supply, fill #0

## 2023-09-14 MED ORDER — PAXLOVID (300/100) 20 X 150 MG & 10 X 100MG PO TBPK
3.0000 | ORAL_TABLET | Freq: Two times a day (BID) | ORAL | 0 refills | Status: AC
  Filled 2023-09-14: qty 30, 5d supply, fill #0

## 2023-09-14 NOTE — ED Provider Notes (Signed)
 [name]    CSN: [id number] Arrival date & time: 09/14/23  1521      History   Chief Complaint Chief Complaint  Patient presents with   Cough    Tested positive for Covid - Entered by patient   Covid     HPI Morgan [name] is a 63 y.o. female.   Patient reports that she spent the weekend with a niece.  Her niece left on Sunday night sick.  Her niece called her yesterday, 09/13/2023, and let her know that the niece was positive for COVID.  Patient tested for COVID and flu last night and she is positive.  Her symptoms only started yesterday, 09/13/2023.  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  She has had to have nerve blocks for the cough.  Her ear nose and throat has warned her that whenever she gets something respiratory she needs to get after it quickly so she does not get bad enough that she needs more nerve blocks.  Prednisone has never worked well for her.  She does have asthma and has all the appropriate inhalers.  She is wheezing some in the last 24 hours but had treatments before she came in today.  She had a photo on her phone dated yesterday that shows were positive COVID test and it was a test that tested for COVID, flu A and flu B.  It was positive for COVID only.   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   Allergy    seasonal   Anemia    Anxiety    Arthritis    Asthma    Asthma 05/20/2007   Qualifier: Diagnosis of   By: Milinda Antis MD, Colon Flattery     IMO SNOMED Dx Update Oct 2024     Back pain 12/04/2011   Carpal tunnel syndrome of left wrist 12/03/2021   Celiac disease    Chest pain 08/26/2022   Chronic cough 03/08/2019   Connective tissue disorder (HCC) 08/17/2014   Depression    Dermatitis  herpetiformis 08/26/2011   Disorder of skin or subcutaneous tissue 07/11/2010   Qualifier: Diagnosis of   By: Milinda Antis MD, Colon Flattery     IMO SNOMED Dx Update Oct 2024     DOE (dyspnea on exertion) 10/26/2022   Dry eye syndrome of both eyes 06/24/2020   Dysphonia 03/08/2019   Dyspnea 08/26/2022   Essential hypertension 03/22/2023   Fatigue 08/26/2011   Fibromyalgia    Fibula fracture    GAD (generalized anxiety disorder) 09/21/2007   Sees psychiatrist - has had inpt tx         GERD 05/20/2007   Qualifier: Diagnosis of   By: Milinda Antis MD, Colon Flattery        GERD (gastroesophageal reflux disease)    Headache    Hemorrhoids 05/20/2007   Qualifier: Diagnosis of   By: Milinda Antis MD, Colon Flattery     IMO SNOMED Dx Update Oct 2024     Herpes simplex virus (HSV) infection 05/20/2007   IMO SNOMED Dx Update Oct 2024     History of asthma 03/08/2019   History of balance disorder 12/04/2011   HLD (hyperlipidemia)    Hx of migraines  Hypertension    Hypothyroidism    Hypoxia 01/20/2023   Insomnia 06/24/2022   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   Joint pain 08/26/2011   Seeing Dr Jon Billings for fibromyalgia and possible SLE or other autoimmune variant  Also bursitis of hips and some OA     Laryngospasms 03/08/2019   Leg pain 12/04/2011   Lupus    Mild episode of recurrent major depressive disorder (HCC) 07/24/2022   Moderate episode of recurrent major depressive disorder (HCC) 03/22/2023   Myalgia and myositis, unspecified    Neck pain 12/04/2011   Numbness 03/08/2014   Other B-complex deficiencies    Palpitations 07/24/2022   Prediabetes 03/22/2023   PTSD (post-traumatic stress disorder)    Recurrent HSV (herpes simplex virus)    Sacroiliitis (HCC) 05/26/2012   Shoulder impingement syndrome, left 10/22/2021   Strain of left trapezius muscle 04/10/2014   Thoracic outlet syndrome    Thoracic outlet syndrome of left thoracic outlet 05/22/2015   Thyroiditis    Thyrotoxicosis 05/20/2007   Qualifier:  Diagnosis of   By: Milinda Antis MD, Colon Flattery     IMO SNOMED Dx Update Oct 2024     Tinea corporis 08/04/2011   Unspecified hemorrhoids without mention of complication    Varicose veins of bilateral lower extremities with other complications 07/11/2010   Qualifier: Diagnosis of   By: Milinda Antis MD, Colon Flattery     IMO SNOMED Dx Update Oct 2024     Varicose veins of lower extremities with other complications    Vertigo    Vitreous floater, bilateral 06/24/2020    Patient Active Problem List   Diagnosis Date Noted   Annual physical exam 06/23/2023   Hypercholesterolemia 06/23/2023   PTSD (post-traumatic stress disorder) 06/23/2023   Fibromyalgia 06/23/2023   Episodic migraine 06/23/2023   Muscle spasm 06/23/2023   History of thoracic outlet syndrome 06/23/2023   Osteopenia 06/23/2023   Celiac disease 06/23/2023   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   BMI 32.0-32.9,adult 06/23/2023   Class 1 obesity due to excess calories with serious comorbidity and body mass index (BMI) of 32.0 to 32.9 in adult 06/23/2023   Anemia 03/22/2023   Moderate episode of recurrent major depressive disorder (HCC) 03/22/2023   Hypothyroidism 03/22/2023   Prediabetes 03/22/2023   Essential hypertension 03/22/2023   DOE (dyspnea on exertion) 10/26/2022   Dyspnea 08/26/2022   Chest pain 08/26/2022   Palpitations 07/24/2022   Insomnia 06/24/2022   Carpal tunnel syndrome of left wrist 12/03/2021   Shoulder impingement syndrome, left 10/22/2021   Dry eye syndrome of both eyes 06/24/2020   Vitreous floater, bilateral 06/24/2020   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   Chronic cough 03/08/2019   Dysphonia 03/08/2019   History of asthma 03/08/2019   Laryngospasms 03/08/2019   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   Thoracic outlet syndrome of left thoracic outlet 05/22/2015   Connective tissue disorder (HCC) 08/17/2014   Strain of left trapezius muscle 04/10/2014    Numbness 03/08/2014   Chest pain, atypical 08/01/2012   Sacroiliitis (HCC) 05/26/2012   Back pain 12/04/2011   Leg pain 12/04/2011   Neck pain 12/04/2011   History of balance disorder 12/04/2011   Fatigue 08/26/2011   Joint pain 08/26/2011   Dermatitis herpetiformis 08/26/2011   Tinea corporis 08/04/2011   Varicose veins of bilateral lower extremities with other complications 07/11/2010   Disorder of skin or subcutaneous tissue 07/11/2010   WEAKNESS 09/17/2008   GAD (generalized anxiety disorder) 09/21/2007   Herpes  simplex virus (HSV) infection 05/20/2007   VITAMIN B12 DEFICIENCY 05/20/2007   HYPERLIPIDEMIA 05/20/2007   DEPRESSION 05/20/2007   Hemorrhoids 05/20/2007   Asthma 05/20/2007   GERD 05/20/2007   FIBROMYALGIA 05/20/2007    Past Surgical History:  Procedure Laterality Date   ABDOMINAL HYSTERECTOMY  1998   COLONOSCOPY  05/14/1999   biopsy negative   ESOPHAGOGASTRODUODENOSCOPY  07/13/2002   normal   FIBULA FRACTURE SURGERY     MANDIBLE SURGERY     NASAL SINUS SURGERY     OTHER SURGICAL HISTORY     laser surgery for endometriosis   TOTAL VAGINAL HYSTERECTOMY  04/12/1997   VENOGRAM N/A 03/06/2014   Procedure: VENOGRAM;  Surgeon: Fransisco Hertz, MD;  Location: [facility] CATH LAB;  Service: Cardiovascular;  Laterality: N/A;    OB History   No obstetric history on file.      Home Medications    Prior to Admission medications   Medication Sig Start Date End Date Taking? Authorizing Provider  buPROPion (WELLBUTRIN XL) 300 MG 24 hr tablet Take 1 tablet (300 mg total) by mouth every morning. 07/29/23  Yes Crist Fat, MD  carisoprodol (SOMA) 350 MG tablet Take 1 tablet (350 mg total) by mouth 3 (three) times daily as needed for muscle spasms. 07/30/23  Yes Crist Fat, MD  clonazePAM (KLONOPIN) 0.5 MG tablet Take 1 tablet (0.5 mg total) by mouth 2 (two) times daily as needed for anxiety. 06/23/23  Yes Crist Fat, MD  DULoxetine (CYMBALTA) 60 MG capsule Take 1  capsule (60 mg total) by mouth every evening. 07/29/23  Yes Crist Fat, MD  levothyroxine (SYNTHROID) 100 MCG tablet TAKE ONE TABLET BY MOUTH monday-saturday before breakfast and TAKE TWO TABLETS BY MOUTH ONCE WEEKLY BEFORE BREAKFAST ON SUNDAYS Patient taking differently: Take 100 mcg by mouth daily before breakfast. 01/05/23  Yes Crist Fat, MD  metoprolol tartrate (LOPRESSOR) 25 MG tablet Take 1 tablet (25 mg total) by mouth 2 (two) times daily. 07/29/23  Yes Crist Fat, MD  nirmatrelvir/ritonavir (PAXLOVID, 300/100,) 20 x 150 MG & 10 x 100MG  TBPK Take 3 tablets by mouth 2 (two) times daily for 5 days. 09/14/23 09/19/23 Yes Prescilla Sours, FNP  promethazine-dextromethorphan (PROMETHAZINE-DM) 6.25-15 MG/5ML syrup Take 5 mLs by mouth 4 (four) times daily as needed. 09/14/23  Yes Prescilla Sours, FNP  rosuvastatin (CRESTOR) 10 MG tablet Take 2 tablets (20 mg total) by mouth at bedtime. 06/29/23  Yes Crist Fat, MD  valACYclovir (VALTREX) 500 MG tablet Take 1-2 tablets (500-1,000 mg total) by mouth daily as needed (for outbreaks). 09/18/22  Yes Crist Fat, MD  acetaminophen (TYLENOL) 500 MG tablet Take 500 mg by mouth every 6 (six) hours as needed for mild pain or moderate pain.    [provider]  clobetasol ointment (TEMOVATE) 0.05 % Apply 1 application  topically 2 (two) times daily as needed (Rash).    [provider]  Cyanocobalamin (B-12) 3000 MCG CAPS Take 1 capsule by mouth daily.    [provider]  dapsone 25 MG tablet Take 25-100 mg by mouth daily as needed (for dermatitis).     [provider]  esomeprazole (NEXIUM) 40 MG capsule Take 40 mg by mouth daily at 12 noon. 02/01/18   [provider]  famotidine (PEPCID) 10 MG tablet Take 1 tablet by mouth at bedtime.    [provider]  fluticasone (FLONASE) 50 MCG/ACT nasal spray Place 1 spray into both nostrils daily.  [provider]  fluticasone-salmeterol (ADVAIR DISKUS)  250-50 MCG/ACT AEPB Inhale 1 puff into the lungs in the morning and at bedtime. 08/03/23   Crist Fat, MD  ibuprofen (ADVIL,MOTRIN) 200 MG tablet Take 800 mg by mouth 2 (two) times daily as needed for headache or moderate pain.    [provider]  loratadine (CLARITIN) 10 MG tablet Take 10 mg by mouth daily.      [provider]  Sod Fluoride-Potassium Nitrate (SODIUM FLUORIDE 5000 SENSITIVE) 1.1-5 % GEL Take 1 Application by mouth 2 (two) times daily. 01/01/22   [provider]    Family History Family History  Problem Relation Age of Onset   Colon polyps Father    Hypertension Father    Asthma Mother    Hypertension Mother    Irritable bowel syndrome Mother    Cancer Mother    Varicose Veins Mother    Colon cancer Maternal Grandmother    Uterine cancer Other    Kidney disease Other     Social History Social History   Tobacco Use   Smoking status: Never   Smokeless tobacco: Never  Vaping Use   Vaping status: Never Used  Substance Use Topics   Alcohol use: Yes    Comment: Very rare   Drug use: No     Allergies   Iodine, Iohexol, Gabapentin, and Pregabalin   Review of Systems Review of Systems  Constitutional:  Negative for chills and fever.  HENT:  Positive for congestion, postnasal drip and rhinorrhea. Negative for ear pain and sore throat.   Eyes:  Negative for pain and visual disturbance.  Respiratory:  Positive for cough. Negative for shortness of breath.   Cardiovascular:  Negative for chest pain and palpitations.  Gastrointestinal:  Negative for abdominal pain, constipation, diarrhea, nausea and vomiting.  Genitourinary:  Negative for dysuria and hematuria.  Musculoskeletal:  Negative for arthralgias and back pain.  Skin:  Negative for color change and rash.  Neurological:  Negative for seizures and syncope.  All other systems reviewed and are negative.    Physical Exam Triage Vital Signs ED Triage Vitals  Encounter Vitals  Group     BP 09/14/23 1547 (!) 146/74     Systolic BP Percentile --      Diastolic BP Percentile --      Pulse Rate 09/14/23 1547 66     Resp 09/14/23 1547 18     Temp 09/14/23 1547 98.3 F (36.8 C)     Temp Source 09/14/23 1547 Oral     SpO2 09/14/23 1547 97 %     Weight --      Height --      Head Circumference --      Peak Flow --      Pain Score 09/14/23 1545 0     Pain Loc --      Pain Education --      Exclude from Growth Chart --    No data found.  Updated Vital Signs BP (!) 146/74 (BP Location: Right Arm)   Pulse 66   Temp 98.3 F (36.8 C) (Oral)   Resp 18   SpO2 97%   Visual Acuity Right Eye Distance:   Left Eye Distance:   Bilateral Distance:    Right Eye Near:   Left Eye Near:    Bilateral Near:     Physical Exam Vitals and nursing note reviewed.  Constitutional:      General: She is not in  acute distress.    Appearance: She is well-developed. She is ill-appearing. She is not toxic-appearing.  HENT:     Head: Normocephalic and atraumatic.     Right Ear: Hearing, tympanic membrane, ear canal and external ear normal.     Left Ear: Hearing, tympanic membrane, ear canal and external ear normal.     Nose: Congestion and rhinorrhea present. Rhinorrhea is clear.     Right Sinus: No maxillary sinus tenderness or frontal sinus tenderness.     Left Sinus: No maxillary sinus tenderness or frontal sinus tenderness.     Mouth/Throat:     Lips: Pink.     Mouth: Mucous membranes are moist.     Pharynx: Uvula midline. No oropharyngeal exudate or posterior oropharyngeal erythema.     Tonsils: No tonsillar exudate.  Eyes:     Conjunctiva/sclera: Conjunctivae normal.     Pupils: Pupils are equal, round, and reactive to light.  Cardiovascular:     Rate and Rhythm: Normal rate and regular rhythm.     Heart sounds: S1 normal and S2 normal. No murmur heard. Pulmonary:     Effort: Pulmonary effort is normal. No respiratory distress.     Breath sounds: Examination of  the right-upper field reveals wheezing. Examination of the left-upper field reveals wheezing. Wheezing (mild and intermittent) present. No decreased breath sounds, rhonchi or rales.  Abdominal:     General: Bowel sounds are normal.     Palpations: Abdomen is soft.     Tenderness: There is no abdominal tenderness.  Musculoskeletal:        General: No swelling.     Cervical back: Neck supple.  Lymphadenopathy:     Head:     Right side of head: No submental, submandibular, tonsillar, preauricular or posterior auricular adenopathy.     Left side of head: No submental, submandibular, tonsillar, preauricular or posterior auricular adenopathy.     Cervical: No cervical adenopathy.     Right cervical: No superficial cervical adenopathy.    Left cervical: No superficial cervical adenopathy.  Skin:    General: Skin is warm and dry.     Capillary Refill: Capillary refill takes less than 2 seconds.     Findings: No rash.  Neurological:     Mental Status: She is alert and oriented to person, place, and time.  Psychiatric:        Mood and Affect: Mood normal.      UC Treatments / Results  Labs (all labs ordered are listed, but only abnormal results are displayed) CMP14 + Anion Gap:06/23/23:    Component Ref Range & Units (hover) 2 mo ago  Glucose 111 High   BUN 19  Creatinine, Ser 0.95  eGFR 69  BUN/Creatinine Ratio 20  Sodium 135  Potassium 4.8  Chloride 99  CO2 24  Anion Gap 12.0  Calcium 9.7  Total Protein 7.1  Albumin 4.2  Globulin, Total 2.9  Bilirubin Total <0.2  Alkaline Phosphatase 122 High   AST 14  ALT 13  Resulting Agency [id number]      EKG   Radiology No results found.  Procedures Procedures (including critical care time)  Medications Ordered in UC Medications - No data to display  Initial Impression / Assessment and Plan / UC Course  I have reviewed the triage vital signs and the nursing notes.  Pertinent labs & imaging results that were available  during my care of the patient were reviewed by me and considered in my medical decision making (  see chart for details).     See discharge instructions to the patient.  She is positive for COVID-19.  Will treat with Paxlovid, 300 mg twice daily.  She will hold certain medications, at the direction of the pharmacist.  She will follow-up if symptoms do not improve, worsen or new symptoms occur.  Also provided Promethazine DM, 5 mL, every 6 hours as needed for cough.  Follow-up as French Ana is here now Final Clinical Impressions(s) / UC Diagnoses   Final diagnoses:  Mild persistent asthma with acute exacerbation  History of chronic cough  COVID-19 virus infection     Discharge Instructions      Positive for COVID-19.  Spoke with the pharmacy and reviewed medications.  She will hold her rosuvastatin, Claritin, clonazepam until she finishes the Paxlovid on the rosuvastatin she will hold for a full 7 days.  Pharmacy will instruct her in more detail.  Complete the Paxlovid as prescribed.  Follow-up if symptoms do not improve, worsen or new symptoms occur.  Promethazine DM, 5 mL, every 6 hours if needed for cough.     ED Prescriptions     Medication Sig Dispense Auth. Provider   nirmatrelvir/ritonavir (PAXLOVID, 300/100,) 20 x 150 MG & 10 x 100MG  TBPK Take 3 tablets by mouth 2 (two) times daily for 5 days. 30 tablet Prescilla Sours, FNP   promethazine-dextromethorphan (PROMETHAZINE-DM) 6.25-15 MG/5ML syrup Take 5 mLs by mouth 4 (four) times daily as needed. 118 mL Prescilla Sours, FNP      PDMP not reviewed this encounter.   Prescilla Sours, FNP 09/14/23 [phone]

## 2023-09-14 NOTE — ED Triage Notes (Signed)
 Pt reports she started feeling bad yesterday she tested for covid this morning and she is positive.

## 2023-09-14 NOTE — Discharge Instructions (Signed)
 Positive for COVID-19.  Spoke with the pharmacy and reviewed medications.  She will hold her rosuvastatin, Claritin, clonazepam until she finishes the Paxlovid on the rosuvastatin she will hold for a full 7 days.  Pharmacy will instruct her in more detail.  Complete the Paxlovid as prescribed.  Follow-up if symptoms do not improve, worsen or new symptoms occur.  Promethazine DM, 5 mL, every 6 hours if needed for cough.

## 2023-09-15 ENCOUNTER — Encounter: Payer: Self-pay | Admitting: Internal Medicine

## 2023-09-20 ENCOUNTER — Ambulatory Visit: Payer: PPO | Admitting: Internal Medicine

## 2023-09-20 ENCOUNTER — Encounter: Payer: Self-pay | Admitting: Internal Medicine

## 2023-09-20 VITALS — BP 128/78 | HR 87 | Temp 98.9°F | Resp 19

## 2023-09-20 DIAGNOSIS — R053 Chronic cough: Secondary | ICD-10-CM

## 2023-09-20 DIAGNOSIS — I10 Essential (primary) hypertension: Secondary | ICD-10-CM

## 2023-09-20 DIAGNOSIS — E7800 Pure hypercholesterolemia, unspecified: Secondary | ICD-10-CM | POA: Diagnosis not present

## 2023-09-20 MED ORDER — HYDROCOD POLI-CHLORPHE POLI ER 10-8 MG/5ML PO SUER: 5.0000 mL | Freq: Two times a day (BID) | ORAL | 0 refills | Status: DC | PRN

## 2023-09-20 NOTE — Addendum Note (Signed)
 Addended by: Crist Fat on: 09/20/2023 02:42 PM   Modules accepted: Orders

## 2023-09-20 NOTE — Assessment & Plan Note (Signed)
 She is recovering from COVID but develops her neurogenic cough whenever she catches a respiratory illness.  We will start her on tussionex.

## 2023-09-20 NOTE — Assessment & Plan Note (Signed)
 We will test her FLP on her next visit.

## 2023-09-20 NOTE — Assessment & Plan Note (Signed)
 Her BP has been doing well.  We will continue to follow.

## 2023-09-20 NOTE — Progress Notes (Signed)
 Office Visit  Subjective   Patient ID: Morgan [name]   DOB: 1961-03-07   Age: 63 y.o.   MRN: [id number]   Chief Complaint Chief Complaint  Patient presents with   Acute Visit    Cold symptoms     History of Present Illness Morgan [name] tells me today that she was diagnosed with COVID-19 about 6 days ago.  She tested herself at that time and she was COVID positive.  She went to urgent care where they started her on paxlovid.  She states she is now improving and denies any symptoms except she now has developed her neurogenic cough she has had before.  This is a paroxysmal cough and she is requesting tussionex which has helped in the past.   The patient is a 63 year old Caucasian/White female who presents for a follow-up evaluation of hypertension.   She had some autonomic instablity since COVID-19 with her BP in the past.  Cardiology has seen her and reduced her amlodipine from 10mg  to 5mg  daily and she last cardiology on 05/06/2023.  They did discontinue her amlodipine at that time and they are discussing switching her metoprolol to long acting metoprolol.  The patient has  been checking her blood pressure at home.  Her systolic BP has been running 120's range.  The patient's current medications include: metoprolol tartrate 25 mg twice daily. The patient has been tolerating her medications well. The patient denies any visual changes, chest pain, shortness of breath, and weakness/numbness. She reports there have been no other symptoms noted.   Morgan [name] returns today for routine followup on her cholesterol. On routine lab testing 3 months ago, we noted her cholesterol was elevated and we increased her crestor from 10mg  to 20mg  daily. Overall, she states she is doing well and is without any complaints or problems at this time. She specifically denies myalgias and fatigue. She remains on dietary management as well as the following cholesterol lowering medications Crestor oral tablet 20 mg at  bedtime. She is fasting in anticipation for labs today.      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   Disorder of skin or subcutaneous tissue 07/11/2010   Qualifier: Diagnosis of   By: Milinda Antis MD, Colon Flattery     IMO SNOMED Dx Update Oct 2024     DOE (dyspnea on exertion) 10/26/2022   Dry eye syndrome of both eyes 06/24/2020   Dysphonia 03/08/2019   Dyspnea 08/26/2022   Essential hypertension 03/22/2023   Fatigue 08/26/2011   Fibromyalgia    Fibula fracture    GAD (generalized anxiety disorder) 09/21/2007   Sees psychiatrist - has had inpt tx         GERD 05/20/2007   Qualifier: Diagnosis of   By: Milinda Antis MD, Colon Flattery        GERD (gastroesophageal reflux disease)    Headache  Hemorrhoids 05/20/2007   Qualifier: Diagnosis of   By: Milinda Antis MD, Colon Flattery     IMO SNOMED Dx Update Oct 2024     Herpes simplex virus (HSV) infection 05/20/2007   IMO SNOMED Dx Update Oct 2024     History of asthma 03/08/2019   History of balance disorder 12/04/2011   HLD (hyperlipidemia)    Hx of migraines    Hypertension    Hypothyroidism    Hypoxia 01/20/2023   Insomnia 06/24/2022   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   Joint pain 08/26/2011   Seeing Dr Jon Billings for fibromyalgia and possible SLE or other autoimmune variant  Also bursitis of hips and some OA     Laryngospasms 03/08/2019   Leg pain 12/04/2011   Lupus    Mild episode of recurrent major depressive disorder (HCC) 07/24/2022   Moderate episode of recurrent  major depressive disorder (HCC) 03/22/2023   Myalgia and myositis, unspecified    Neck pain 12/04/2011   Numbness 03/08/2014   Other B-complex deficiencies    Palpitations 07/24/2022   Prediabetes 03/22/2023   PTSD (post-traumatic stress disorder)    Recurrent HSV (herpes simplex virus)    Sacroiliitis (HCC) 05/26/2012   Shoulder impingement syndrome, left 10/22/2021   Strain of left trapezius muscle 04/10/2014   Thoracic outlet syndrome    Thoracic outlet syndrome of left thoracic outlet 05/22/2015   Thyroiditis    Thyrotoxicosis 05/20/2007   Qualifier: Diagnosis of   By: Milinda Antis MD, Colon Flattery     IMO SNOMED Dx Update Oct 2024     Tinea corporis 08/04/2011   Unspecified hemorrhoids without mention of complication    Varicose veins of bilateral lower extremities with other complications 07/11/2010   Qualifier: Diagnosis of   By: Milinda Antis MD, Colon Flattery     IMO SNOMED Dx Update Oct 2024     Varicose veins of lower extremities with other complications    Vertigo    Vitreous floater, bilateral 06/24/2020     Allergies Allergies  Allergen Reactions   Iodine Other (See Comments)    REACTION: SOB with injectable iodine   Iohexol Shortness Of Breath   Gabapentin    Pregabalin Other (See Comments)    Altered mental status     Medications  Current Outpatient Medications:    chlorpheniramine-HYDROcodone (TUSSIONEX) 10-8 MG/5ML, Take 5 mLs by mouth every 12 (twelve) hours as needed for cough., Disp: 250 mL, Rfl: 0   acetaminophen (TYLENOL) 500 MG tablet, Take 500 mg by mouth every 6 (six) hours as needed for mild pain or moderate pain., Disp: , Rfl:    buPROPion (WELLBUTRIN XL) 300 MG 24 hr tablet, Take 1 tablet (300 mg total) by mouth every morning., Disp: 90 tablet, Rfl: 1   carisoprodol (SOMA) 350 MG tablet, Take 1 tablet (350 mg total) by mouth 3 (three) times daily as needed for muscle spasms., Disp: 90 tablet, Rfl: 2   clobetasol ointment (TEMOVATE) 0.05 %, Apply 1 application   topically 2 (two) times daily as needed (Rash)., Disp: , Rfl:    clonazePAM (KLONOPIN) 0.5 MG tablet, Take 1 tablet (0.5 mg total) by mouth 2 (two) times daily as needed for anxiety., Disp: 60 tablet, Rfl: 2   Cyanocobalamin (B-12) 3000 MCG CAPS, Take 1 capsule by mouth daily., Disp: , Rfl:    dapsone 25 MG tablet, Take 25-100 mg by mouth daily as needed (for dermatitis). , Disp: , Rfl:    DULoxetine (CYMBALTA) 60 MG capsule, Take 1 capsule (60  mg total) by mouth every evening., Disp: 90 capsule, Rfl: 1   esomeprazole (NEXIUM) 40 MG capsule, Take 40 mg by mouth daily at 12 noon., Disp: , Rfl:    famotidine (PEPCID) 10 MG tablet, Take 1 tablet by mouth at bedtime., Disp: , Rfl:    fluticasone (FLONASE) 50 MCG/ACT nasal spray, Place 1 spray into both nostrils daily., Disp: , Rfl:    fluticasone-salmeterol (ADVAIR DISKUS) 250-50 MCG/ACT AEPB, Inhale 1 puff into the lungs in the morning and at bedtime., Disp: 60 each, Rfl: 5   ibuprofen (ADVIL,MOTRIN) 200 MG tablet, Take 800 mg by mouth 2 (two) times daily as needed for headache or moderate pain., Disp: , Rfl:    levothyroxine (SYNTHROID) 100 MCG tablet, TAKE ONE TABLET BY MOUTH monday-saturday before breakfast and TAKE TWO TABLETS BY MOUTH ONCE WEEKLY BEFORE BREAKFAST ON SUNDAYS (Patient taking differently: Take 100 mcg by mouth daily before breakfast.), Disp: 90 tablet, Rfl: 1   loratadine (CLARITIN) 10 MG tablet, Take 10 mg by mouth daily.  , Disp: , Rfl:    metoprolol tartrate (LOPRESSOR) 25 MG tablet, Take 1 tablet (25 mg total) by mouth 2 (two) times daily., Disp: 180 tablet, Rfl: 1   promethazine-dextromethorphan (PROMETHAZINE-DM) 6.25-15 MG/5ML syrup, Take 5 mLs by mouth 4 (four) times daily as needed., Disp: 118 mL, Rfl: 0   rosuvastatin (CRESTOR) 10 MG tablet, Take 2 tablets (20 mg total) by mouth at bedtime., Disp: 90 tablet, Rfl: 1   Sod Fluoride-Potassium Nitrate (SODIUM FLUORIDE 5000 SENSITIVE) 1.1-5 % GEL, Take 1 Application by mouth 2  (two) times daily., Disp: , Rfl:    valACYclovir (VALTREX) 500 MG tablet, Take 1-2 tablets (500-1,000 mg total) by mouth daily as needed (for outbreaks)., Disp: 30 tablet, Rfl: 1   Review of Systems Review of Systems  Constitutional:  Negative for chills, fever and malaise/fatigue.  Eyes:  Negative for blurred vision and double vision.  Respiratory:  Negative for cough and shortness of breath.   Cardiovascular:  Negative for chest pain, palpitations and leg swelling.  Gastrointestinal:  Negative for abdominal pain, constipation, diarrhea, nausea and vomiting.  Musculoskeletal:  Negative for myalgias.  Skin:  Negative for rash.  Neurological:  Negative for dizziness, weakness and headaches.       Objective:    Vitals BP 128/78   Pulse 87   Temp 98.9 F (37.2 C)   Resp 19   SpO2 99%    Physical Examination Physical Exam Constitutional:      Appearance: Normal appearance. She is not ill-appearing.  Cardiovascular:     Rate and Rhythm: Normal rate and regular rhythm.     Pulses: Normal pulses.     Heart sounds: No murmur heard.    No friction rub. No gallop.  Pulmonary:     Effort: Pulmonary effort is normal. No respiratory distress.     Breath sounds: No wheezing, rhonchi or rales.  Abdominal:     General: Bowel sounds are normal. There is no distension.     Palpations: Abdomen is soft.     Tenderness: There is no abdominal tenderness.  Musculoskeletal:     Right lower leg: No edema.     Left lower leg: No edema.  Skin:    General: Skin is warm and dry.     Findings: No rash.  Neurological:     Mental Status: She is alert.        Assessment & Plan:   Chronic cough She is recovering from  COVID but develops her neurogenic cough whenever she catches a respiratory illness.  We will start her on tussionex.  Hypercholesterolemia We will test her FLP on her next visit.  Essential hypertension Her BP has been doing well.  We will continue to follow.    No  follow-ups on file.   Crist Fat, MD

## 2023-10-05 ENCOUNTER — Ambulatory Visit: Admitting: Student

## 2023-10-05 ENCOUNTER — Other Ambulatory Visit: Payer: Self-pay | Admitting: Internal Medicine

## 2023-10-05 ENCOUNTER — Encounter: Payer: Self-pay | Admitting: Student

## 2023-10-05 VITALS — BP 160/96 | HR 85 | Temp 98.5°F | Resp 20 | Ht 64.0 in | Wt 194.2 lb

## 2023-10-05 DIAGNOSIS — R053 Chronic cough: Secondary | ICD-10-CM | POA: Diagnosis not present

## 2023-10-05 MED ORDER — HYDROCOD POLI-CHLORPHE POLI ER 10-8 MG/5ML PO SUER: 5.0000 mL | Freq: Two times a day (BID) | ORAL | 0 refills | Status: DC | PRN

## 2023-10-05 NOTE — Assessment & Plan Note (Signed)
 Antitussives per medication orders- I called the pharmacy to verify dispense of medication sooner. Tussinex prescribed by Dr. Leonia Reader to be filled.   Patient instructed to avoid exposure to tobacco smoke and fumes. Call if shortness of breath worsens, blood in sputum, change in character of cough, development of fever or chills, inability to maintain nutrition and hydration. Avoid exposure to tobacco smoke and fumes.

## 2023-10-05 NOTE — Patient Instructions (Signed)
 Please drink plenty of fluids, also be careful when changing position to decrease risks for falls.

## 2023-10-05 NOTE — Progress Notes (Signed)
 Acute Office Visit  Subjective:     Patient ID: Morgan [name], female    DOB: May 31, 1961, 63 y.o.   MRN: [id number]  Chief Complaint  Patient presents with   Office Visit    Follow up for a cough.    HPI  Subjective:     Morgan [name] is a 63 y.o. female here for evaluation of a cough. Onset of symptoms was 3 weeks ago. Symptoms have been unchanged since that time. The cough is dry, nonproductive, painful, and paroxysmal and is aggravated by  talking, eating, and activity . She reports breaking three ribs in the past and being evaluated by ENT for a nerve block. Associated symptoms include: change in voice and chest pain. Patient does have a history of asthma. Patient does have a history of environmental allergens. Patient has not traveled recently. Patient does not have a history of smoking. Patient has not had a previous chest x-ray.   She was seen by my attending Dr. Leonia Reader on 09/20/23 for complaints of cough after testing + for Covid-19 March 4,2025, she's completed a course of paxlovid and felt her symptoms to be improving except for a neurogenic cough. She has had this before and reports paroxysmal cough, she received tussionex but spilled the medication. Another order was sent but the pharmacy is unable to fill prescription until April 2nd.  She is requesting another medication in the mean time to help with her cough.    Review of Systems  HENT: Negative.    Eyes: Negative.   Respiratory:  Positive for cough. Negative for hemoptysis, sputum production, shortness of breath and wheezing.   Cardiovascular:  Positive for chest pain.  Gastrointestinal: Negative.   Genitourinary: Negative.   Musculoskeletal: Negative.   Skin: Negative.   Neurological:  Positive for headaches.  Endo/Heme/Allergies:  Positive for environmental allergies.  Psychiatric/Behavioral: Negative.          Objective:    BP (!) 160/96   Pulse 85   Temp 98.5 F (36.9 C) (Oral)   Resp 20   Ht 5'  4" (1.626 m)   Wt 194 lb 3.2 oz (88.1 kg)   SpO2 97%   BMI 33.33 kg/m    Physical Exam Vitals reviewed.  Constitutional:      Appearance: She is ill-appearing.  HENT:     Head: Normocephalic and atraumatic.     Nose: Nose normal.     Mouth/Throat:     Mouth: Mucous membranes are moist.  Eyes:     Conjunctiva/sclera: Conjunctivae normal.     Pupils: Pupils are equal, round, and reactive to light.  Cardiovascular:     Rate and Rhythm: Normal rate and regular rhythm.     Pulses: Normal pulses.     Heart sounds: Normal heart sounds.  Pulmonary:     Effort: Tachypnea present.     Breath sounds: Normal breath sounds. No stridor, decreased air movement or transmitted upper airway sounds. No decreased breath sounds, wheezing, rhonchi or rales.     Comments: Paroxsymal cough Chest:     Chest wall: Tenderness present.  Abdominal:     General: Abdomen is flat. Bowel sounds are normal.     Palpations: Abdomen is soft.  Musculoskeletal:        General: Normal range of motion.  Skin:    General: Skin is warm and moist.     Capillary Refill: Capillary refill takes less than 2 seconds.     Coloration: Skin is pale.  Neurological:     General: No focal deficit present.     Mental Status: She is alert and oriented to person, place, and time.  Psychiatric:        Mood and Affect: Mood normal.        Behavior: Behavior normal.     No results found for any visits on 10/05/23.      Assessment & Plan:   Problem List Items Addressed This Visit     Chronic cough - Primary   Antitussives per medication orders- I called the pharmacy to verify dispense of medication sooner. Tussinex prescribed by Dr. Leonia Reader to be filled.   Patient instructed to avoid exposure to tobacco smoke and fumes. Call if shortness of breath worsens, blood in sputum, change in character of cough, development of fever or chills, inability to maintain nutrition and hydration. Avoid exposure to tobacco smoke and  fumes.         No orders of the defined types were placed in this encounter.   Return if symptoms worsen or fail to improve.  Edwena Blow, NP

## 2023-10-30 ENCOUNTER — Other Ambulatory Visit: Payer: Self-pay | Admitting: Internal Medicine

## 2023-11-04 ENCOUNTER — Other Ambulatory Visit: Payer: Self-pay | Admitting: Internal Medicine

## 2023-11-04 MED ORDER — CLONAZEPAM 0.5 MG PO TABS: 0.5000 mg | ORAL_TABLET | Freq: Two times a day (BID) | ORAL | 2 refills | Status: DC | PRN

## 2023-11-24 ENCOUNTER — Other Ambulatory Visit: Payer: Self-pay | Admitting: Internal Medicine

## 2023-11-24 MED ORDER — HYDROCOD POLI-CHLORPHE POLI ER 10-8 MG/5ML PO SUER: 5.0000 mL | Freq: Two times a day (BID) | ORAL | 0 refills | Status: DC | PRN

## 2023-12-02 ENCOUNTER — Other Ambulatory Visit: Payer: Self-pay | Admitting: Internal Medicine

## 2023-12-03 ENCOUNTER — Other Ambulatory Visit: Payer: Self-pay | Admitting: Internal Medicine

## 2023-12-03 MED ORDER — CLONAZEPAM 0.5 MG PO TABS: 0.5000 mg | ORAL_TABLET | Freq: Two times a day (BID) | ORAL | 2 refills | Status: DC | PRN

## 2023-12-03 MED ORDER — CARISOPRODOL 350 MG PO TABS: 350.0000 mg | ORAL_TABLET | Freq: Three times a day (TID) | ORAL | 2 refills | Status: DC | PRN

## 2023-12-07 ENCOUNTER — Ambulatory Visit: Admitting: Internal Medicine

## 2023-12-08 ENCOUNTER — Ambulatory Visit: Admitting: Internal Medicine

## 2023-12-08 ENCOUNTER — Encounter: Payer: Self-pay | Admitting: Internal Medicine

## 2023-12-08 VITALS — BP 162/98 | HR 97 | Temp 98.0°F | Resp 18 | Ht 64.0 in | Wt 187.2 lb

## 2023-12-08 DIAGNOSIS — F331 Major depressive disorder, recurrent, moderate: Secondary | ICD-10-CM

## 2023-12-08 DIAGNOSIS — S2242XA Multiple fractures of ribs, left side, initial encounter for closed fracture: Secondary | ICD-10-CM | POA: Diagnosis not present

## 2023-12-08 DIAGNOSIS — F411 Generalized anxiety disorder: Secondary | ICD-10-CM | POA: Diagnosis not present

## 2023-12-08 DIAGNOSIS — F410 Panic disorder [episodic paroxysmal anxiety] without agoraphobia: Secondary | ICD-10-CM | POA: Diagnosis not present

## 2023-12-08 DIAGNOSIS — R079 Chest pain, unspecified: Secondary | ICD-10-CM | POA: Diagnosis not present

## 2023-12-08 MED ORDER — CLONAZEPAM 0.5 MG PO TABS: 1.0000 mg | ORAL_TABLET | Freq: Two times a day (BID) | ORAL | Status: DC | PRN

## 2023-12-08 MED ORDER — DULOXETINE HCL 60 MG PO CPEP: 60.0000 mg | ORAL_CAPSULE | Freq: Two times a day (BID) | ORAL | 1 refills | Status: AC

## 2023-12-08 NOTE — Assessment & Plan Note (Signed)
 Her depression and anxiety are worsened.  This is probably situational but I am going to increase her cymbalta  to 60mg  BID and increase her klonopin  to 1mg  po BID prn.
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
## 2023-12-08 NOTE — Assessment & Plan Note (Signed)
 Her anxiety and panic attacks have worsened.  Plan as below.

## 2023-12-08 NOTE — Assessment & Plan Note (Signed)
 She has neurogenic cough after her COVID-19.  She can use her cough syrup but she believes she has broken ribs from cough.  We will obtain a CXR today.

## 2023-12-08 NOTE — Assessment & Plan Note (Signed)
 Plan as above.

## 2023-12-13 DIAGNOSIS — R000 Tachycardia, unspecified: Secondary | ICD-10-CM | POA: Diagnosis not present

## 2023-12-13 DIAGNOSIS — J441 Chronic obstructive pulmonary disease with (acute) exacerbation: Secondary | ICD-10-CM | POA: Diagnosis not present

## 2023-12-13 DIAGNOSIS — R058 Other specified cough: Secondary | ICD-10-CM | POA: Diagnosis not present

## 2023-12-13 DIAGNOSIS — D649 Anemia, unspecified: Secondary | ICD-10-CM | POA: Diagnosis not present

## 2023-12-13 DIAGNOSIS — R0602 Shortness of breath: Secondary | ICD-10-CM | POA: Diagnosis not present

## 2023-12-13 DIAGNOSIS — E039 Hypothyroidism, unspecified: Secondary | ICD-10-CM | POA: Diagnosis not present

## 2023-12-13 DIAGNOSIS — K900 Celiac disease: Secondary | ICD-10-CM | POA: Diagnosis not present

## 2023-12-13 DIAGNOSIS — I479 Paroxysmal tachycardia, unspecified: Secondary | ICD-10-CM | POA: Diagnosis not present

## 2023-12-13 DIAGNOSIS — E669 Obesity, unspecified: Secondary | ICD-10-CM | POA: Diagnosis not present

## 2023-12-13 DIAGNOSIS — F39 Unspecified mood [affective] disorder: Secondary | ICD-10-CM | POA: Diagnosis not present

## 2023-12-13 DIAGNOSIS — R0902 Hypoxemia: Secondary | ICD-10-CM | POA: Diagnosis not present

## 2023-12-13 DIAGNOSIS — I2699 Other pulmonary embolism without acute cor pulmonale: Secondary | ICD-10-CM | POA: Diagnosis not present

## 2023-12-13 DIAGNOSIS — K219 Gastro-esophageal reflux disease without esophagitis: Secondary | ICD-10-CM | POA: Diagnosis not present

## 2023-12-13 DIAGNOSIS — Z79899 Other long term (current) drug therapy: Secondary | ICD-10-CM | POA: Diagnosis not present

## 2023-12-13 DIAGNOSIS — Z6832 Body mass index (BMI) 32.0-32.9, adult: Secondary | ICD-10-CM | POA: Diagnosis not present

## 2023-12-13 DIAGNOSIS — Z7952 Long term (current) use of systemic steroids: Secondary | ICD-10-CM | POA: Diagnosis not present

## 2023-12-13 DIAGNOSIS — Z87440 Personal history of urinary (tract) infections: Secondary | ICD-10-CM | POA: Diagnosis not present

## 2023-12-13 DIAGNOSIS — E876 Hypokalemia: Secondary | ICD-10-CM | POA: Diagnosis not present

## 2023-12-13 DIAGNOSIS — E7800 Pure hypercholesterolemia, unspecified: Secondary | ICD-10-CM | POA: Diagnosis not present

## 2023-12-13 DIAGNOSIS — J45909 Unspecified asthma, uncomplicated: Secondary | ICD-10-CM | POA: Diagnosis not present

## 2023-12-13 DIAGNOSIS — Z8616 Personal history of COVID-19: Secondary | ICD-10-CM | POA: Diagnosis not present

## 2023-12-13 DIAGNOSIS — R739 Hyperglycemia, unspecified: Secondary | ICD-10-CM | POA: Diagnosis not present

## 2023-12-13 DIAGNOSIS — J9601 Acute respiratory failure with hypoxia: Secondary | ICD-10-CM | POA: Diagnosis not present

## 2023-12-13 DIAGNOSIS — I10 Essential (primary) hypertension: Secondary | ICD-10-CM | POA: Diagnosis not present

## 2023-12-13 DIAGNOSIS — M1990 Unspecified osteoarthritis, unspecified site: Secondary | ICD-10-CM | POA: Diagnosis not present

## 2023-12-13 DIAGNOSIS — Z8709 Personal history of other diseases of the respiratory system: Secondary | ICD-10-CM | POA: Diagnosis not present

## 2023-12-14 DIAGNOSIS — R0902 Hypoxemia: Secondary | ICD-10-CM | POA: Diagnosis not present

## 2023-12-14 DIAGNOSIS — I479 Paroxysmal tachycardia, unspecified: Secondary | ICD-10-CM | POA: Diagnosis not present

## 2023-12-14 DIAGNOSIS — J9601 Acute respiratory failure with hypoxia: Secondary | ICD-10-CM | POA: Diagnosis not present

## 2023-12-14 DIAGNOSIS — E876 Hypokalemia: Secondary | ICD-10-CM | POA: Diagnosis not present

## 2023-12-15 DIAGNOSIS — I479 Paroxysmal tachycardia, unspecified: Secondary | ICD-10-CM | POA: Diagnosis not present

## 2023-12-15 DIAGNOSIS — R0902 Hypoxemia: Secondary | ICD-10-CM | POA: Diagnosis not present

## 2023-12-15 DIAGNOSIS — E876 Hypokalemia: Secondary | ICD-10-CM | POA: Diagnosis not present

## 2023-12-15 DIAGNOSIS — J9690 Respiratory failure, unspecified, unspecified whether with hypoxia or hypercapnia: Secondary | ICD-10-CM | POA: Diagnosis not present

## 2023-12-16 ENCOUNTER — Telehealth: Payer: Self-pay

## 2023-12-16 NOTE — Telephone Encounter (Signed)
 TOC

## 2023-12-22 ENCOUNTER — Other Ambulatory Visit: Payer: Self-pay | Admitting: Internal Medicine

## 2023-12-22 ENCOUNTER — Encounter: Payer: Self-pay | Admitting: Internal Medicine

## 2023-12-22 ENCOUNTER — Ambulatory Visit: Admitting: Internal Medicine

## 2023-12-22 VITALS — BP 130/86 | HR 79 | Temp 98.4°F | Resp 18 | Ht 64.0 in | Wt 190.4 lb

## 2023-12-22 DIAGNOSIS — D509 Iron deficiency anemia, unspecified: Secondary | ICD-10-CM

## 2023-12-22 DIAGNOSIS — J4520 Mild intermittent asthma, uncomplicated: Secondary | ICD-10-CM

## 2023-12-22 DIAGNOSIS — E7800 Pure hypercholesterolemia, unspecified: Secondary | ICD-10-CM | POA: Diagnosis not present

## 2023-12-22 MED ORDER — CLONAZEPAM 0.5 MG PO TABS: 1.0000 mg | ORAL_TABLET | Freq: Two times a day (BID) | ORAL | 2 refills | Status: DC | PRN

## 2023-12-22 NOTE — Assessment & Plan Note (Signed)
 Her asthma has always been controlled.  They gave her prednisone  and it seems to have helped her hypoxia.  She has no hypoxia today.  She will continue her asthma medications and followup with pulmonary as directed.

## 2023-12-22 NOTE — Addendum Note (Signed)
 Addended by: Adelfa Adolph on: 12/22/2023 02:28 PM   Modules accepted: Orders

## 2023-12-22 NOTE — Progress Notes (Signed)
 Office Visit  Subjective   Patient ID: Morgan [name]   DOB: [birth year]   Age: 63 y.o.   MRN: [id number]   Chief Complaint Chief Complaint  Patient presents with   Follow-up    Pt reports she went to [facility] ED a week ago, states her oxygen  was 85% and she went to the ED and they placed her on oxygen  and her oxygen  stayed around 90%-91%. The patient reports she was prescribed steroids, and read on her discharge summary that it could be auto immune issues.      History of Present Illness Morgan [name] is a 62 yo female who comes in today for a hospital followup where she was admitted to [facility] from 12/13/2023 until 12/15/2023 where she presented with cough and SOB.  The patient noted at home that her oxygen  sats were in the 80's on RA and even when she put 3L of oxygen  via [place] on with her mother's oxygen .  She presented to the ER where her lab workup and CXR were unremarkable.  They did an ECHO on 12/14/2023 that hoswed a normal LV function with a LVEF of 60-65% with pseudonormal LV diastolic function.  She had normal wall motion and her right ventricular systolic function was normal.  There was trivial AR and TR.  Her bubble study was negative.  She had a CTA of her chest that was unremarkable.  The patient was seen on pulmonary and she was started on prednisone  and duoneb and she did improve.   Pulmonary mentioned she had asthma but otherwise the patient improved with her hypoxia and she as sent home on RA with instructions to followup with pulmonary medicine.  There was no evidence of pneumonia, heart failure, pulmonary embolism, or COPD exacerbation.  She was sent home on advair and albuterol  and a tapering course of prednisone .  She tried to followup with pulmonary this week but the appointment was mistakenly given at a wrong time.  She has followup coming up.  Today, she denies any fevers, chills, wheezing, chest pain.  She has some mild SOB when she walks.  Her oxygen  sats have remained stable.  She states her  chronic neurogenic cough which has now resolved.  Morgan [name] returns today for routine followup on her cholesterol. On routine lab testing 3 months ago, we noted her cholesterol was elevated and we increased her crestor  from 10mg  to 20mg  daily. Overall, she states she is doing well and is without any complaints or problems at this time. She specifically denies myalgias and fatigue. She remains on dietary management as well as the following cholesterol lowering medications Crestor  oral tablet 20 mg at bedtime. She is fasting in anticipation for labs today.   Morgan [name] was admitted to [facility] in 12/2022 due to breaking out with herpatiformis dermatitis where this has been associated with her celiac disease.  The patient began having increased SOB and pain radiation down her left arm for a few days before admission.  She went to the urgent care but was found hypoxic with oxygen  sats at home of 83%.  They sent her to the ER where they did a CXR that showed no active cardiopulmonary disease.  They also did a CTA of the chest that showed no acute cardiopulmonary disease and no evidence of PE.  They noted she had some tachycardia but she had told them she has positional tachycardia with heart rate in the 140's with just simple walking as an outpatient since 08/2022.  She  was admitted to [facility] with acute COPD exacerbation with hypoxia.  She was placed on oxygen .  They noted she had no wheezing and did not feel her hypoxia was related to her asthma.  She tells me that she has had problems with anemia with dapsone in the past.  They noted she had anemia with a HgB 10.6 and they did iron studies where her iron was 52 and her ferritin was 19 with a reticulocyte count at 4%.  She was placed on iron and was sent home prednisone  and oxygen  via [place].  She is not on long term oxygen .   She is no longer on oral iron.  She denies any problems with breathing today.  Her last iron studies were done in 03/2023 and this showed an iron level  of 75 and a HgB of 12.3  Her ferritin level was low on her last visit as well.     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   Disorder of skin or subcutaneous tissue 07/11/2010   Qualifier: Diagnosis of   By: Malissa Se MD, Dannette Dutch     IMO SNOMED Dx Update Oct 2024     DOE (dyspnea on exertion) 10/26/2022   Dry eye syndrome of both eyes 06/24/2020   Dysphonia 03/08/2019   Dyspnea 08/26/2022   Essential hypertension 03/22/2023   Fatigue 08/26/2011   Fibromyalgia    Fibula fracture    GAD (generalized anxiety disorder) 09/21/2007   Sees psychiatrist - has had inpt tx         GERD 05/20/2007   Qualifier: Diagnosis of   By: Malissa Se MD, Dannette Dutch        GERD (gastroesophageal reflux disease)    Headache    Hemorrhoids 05/20/2007   Qualifier: Diagnosis of   By: Malissa Se MD, Dannette Dutch     IMO SNOMED Dx Update Oct 2024     Herpes simplex virus (HSV) infection 05/20/2007   IMO SNOMED Dx Update Oct 2024     History of asthma 03/08/2019   History of balance disorder 12/04/2011   HLD (hyperlipidemia)    Hx of migraines    Hypertension    Hypothyroidism    Hypoxia 01/20/2023   Insomnia 06/24/2022   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   Joint pain 08/26/2011   Seeing Dr Eulis Hicks for fibromyalgia and possible SLE or other autoimmune variant  Also bursitis of hips and some OA     Laryngospasms 03/08/2019   Leg pain 12/04/2011   Lupus    Mild episode of recurrent major depressive disorder (HCC) 07/24/2022   Moderate  episode of recurrent major depressive disorder (HCC) 03/22/2023   Myalgia and myositis, unspecified    Neck pain 12/04/2011   Numbness 03/08/2014   Other B-complex deficiencies    Palpitations 07/24/2022   Prediabetes 03/22/2023   PTSD (post-traumatic stress disorder)    Recurrent HSV (herpes simplex  virus)    Sacroiliitis (HCC) 05/26/2012   Shoulder impingement syndrome, left 10/22/2021   Strain of left trapezius muscle 04/10/2014   Thoracic outlet syndrome    Thoracic outlet syndrome of left thoracic outlet 05/22/2015   Thyroiditis    Thyrotoxicosis 05/20/2007   Qualifier: Diagnosis of   By: Malissa Se MD, Dannette Dutch     IMO SNOMED Dx Update Oct 2024     Tinea corporis 08/04/2011   Unspecified hemorrhoids without mention of complication    Varicose veins of bilateral lower extremities with other complications 07/11/2010   Qualifier: Diagnosis of   By: Malissa Se MD, Dannette Dutch     IMO SNOMED Dx Update Oct 2024     Varicose veins of lower extremities with other complications    Vertigo    Vitreous floater, bilateral 06/24/2020     Allergies Allergies  Allergen Reactions   Iodine Other (See Comments)    REACTION: SOB with injectable iodine   Iohexol  Shortness Of Breath   Gabapentin     Pregabalin  Other (See Comments)    Altered mental status     Medications  Current Outpatient Medications:    folic acid (FOLVITE) 1 MG tablet, Take 1 mg by mouth daily., Disp: , Rfl:    pyridOXINE (VITAMIN B6) 50 MG tablet, Take 50 mg by mouth daily., Disp: , Rfl:    thiamine (VITAMIN B-1) 100 MG tablet, Take 100 mg by mouth daily., Disp: , Rfl:    acetaminophen  (TYLENOL ) 500 MG tablet, Take 500 mg by mouth every 6 (six) hours as needed for mild pain or moderate pain., Disp: , Rfl:    buPROPion  (WELLBUTRIN  XL) 300 MG 24 hr tablet, Take 1 tablet (300 mg total) by mouth every morning., Disp: 90 tablet, Rfl: 1   carisoprodol  (SOMA ) 350 MG tablet, Take 1 tablet (350 mg total) by mouth 3 (three) times daily  as needed for muscle spasms., Disp: 90 tablet, Rfl: 2   clobetasol ointment (TEMOVATE) 0.05 %, Apply 1 application  topically 2 (two) times daily as needed (Rash)., Disp: , Rfl:    clonazePAM  (KLONOPIN ) 0.5 MG tablet, Take 2 tablets (1 mg total) by mouth 2 (two) times daily as needed for anxiety., Disp: , Rfl:    Cyanocobalamin (B-12) 3000 MCG CAPS, Take 1 capsule by mouth daily., Disp: , Rfl:    dapsone 25 MG tablet, Take 25-100 mg by mouth daily as needed (for dermatitis). , Disp: , Rfl:    DULoxetine  (CYMBALTA ) 60 MG capsule, Take 1 capsule (60 mg total) by mouth 2 (two) times daily., Disp: 180 capsule, Rfl: 1   esomeprazole (NEXIUM) 40 MG capsule, Take 40 mg by mouth daily at 12 noon., Disp: , Rfl:    famotidine (PEPCID) 10 MG tablet, Take 1 tablet by mouth at bedtime., Disp: , Rfl:    fluticasone  (FLONASE ) 50 MCG/ACT nasal spray, Place 1 spray into both nostrils daily., Disp: , Rfl:    fluticasone -salmeterol (ADVAIR DISKUS) 250-50 MCG/ACT AEPB, Inhale 1 puff into the lungs in the morning and at bedtime., Disp: 60 each, Rfl: 5   ibuprofen (ADVIL,MOTRIN) 200 MG tablet, Take 800 mg by mouth 2 (two) times daily as needed for headache or moderate pain., Disp: , Rfl:    levothyroxine (SYNTHROID) 100 MCG tablet, TAKE ONE TABLET BY MOUTH monday-saturday before breakfast and TAKE TWO TABLETS BY MOUTH ONCE WEEKLY BEFORE BREAKFAST ON SUNDAYS (Patient taking differently: Take 100 mcg by mouth daily before breakfast.), Disp: 90 tablet, Rfl: 1   loratadine (  CLARITIN) 10 MG tablet, Take 10 mg by mouth daily.  , Disp: , Rfl:    metoprolol  tartrate (LOPRESSOR ) 25 MG tablet, TAKE 1 TABLET(25 MG) BY MOUTH TWICE DAILY, Disp: 180 tablet, Rfl: 1   rosuvastatin  (CRESTOR ) 10 MG tablet, Take 2 tablets (20 mg total) by mouth at bedtime., Disp: 90 tablet, Rfl: 1   Sod Fluoride-Potassium Nitrate (SODIUM FLUORIDE 5000 SENSITIVE) 1.1-5 % GEL, Take 1 Application by mouth 2 (two) times daily., Disp: , Rfl:    valACYclovir   (VALTREX ) 500 MG tablet, Take 1-2 tablets (500-1,000 mg total) by mouth daily as needed (for outbreaks)., Disp: 30 tablet, Rfl: 1   Review of Systems Review of Systems  Constitutional:  Negative for chills, fever and malaise/fatigue.  Respiratory:  Positive for shortness of breath. Negative for cough and wheezing.   Cardiovascular:  Negative for chest pain, palpitations and leg swelling.  Gastrointestinal:  Negative for abdominal pain, constipation, diarrhea, nausea and vomiting.  Genitourinary:  Negative for frequency.  Musculoskeletal:  Negative for myalgias.  Skin:  Negative for rash.  Neurological:  Negative for dizziness, weakness and headaches.  Endo/Heme/Allergies:  Negative for polydipsia.       Objective:    Vitals BP 130/86   Pulse 79   Temp 98.4 F (36.9 C) (Oral)   Resp 18   Ht 5' 4 (1.626 m)   Wt 190 lb 6.4 oz (86.4 kg)   SpO2 98%   BMI 32.68 kg/m    Physical Examination Physical Exam Constitutional:      Appearance: Normal appearance. She is not ill-appearing.  Cardiovascular:     Rate and Rhythm: Normal rate and regular rhythm.     Pulses: Normal pulses.     Heart sounds: No murmur heard.    No friction rub. No gallop.  Pulmonary:     Effort: Pulmonary effort is normal. No respiratory distress.     Breath sounds: No wheezing, rhonchi or rales.  Abdominal:     General: Bowel sounds are normal. There is no distension.     Palpations: Abdomen is soft.     Tenderness: There is no abdominal tenderness.  Musculoskeletal:     Right lower leg: No edema.     Left lower leg: No edema.  Skin:    General: Skin is warm and dry.     Findings: No rash.  Neurological:     General: No focal deficit present.     Mental Status: She is alert and oriented to person, place, and time.  Psychiatric:        Mood and Affect: Mood normal.        Behavior: Behavior normal.        Assessment & Plan:   Asthma Her asthma has always been controlled.  They gave her  prednisone  and it seems to have helped her hypoxia.  She has no hypoxia today.  She will continue her asthma medications and followup with pulmonary as directed.  Anemia We will check her iron levels as well as her ferritin and reticulocye count.  She is not on an iron supplment but she is on folic acid.  Hypercholesterolemia We will check her FLP as we changed her crestor  on her last visit.    Return in about 3 months (around 03/23/2024).   Wayne Haines, MD

## 2023-12-22 NOTE — Assessment & Plan Note (Addendum)
 We will check her iron levels as well as her ferritin and reticulocye count.  She is not on an iron supplment but she is on folic acid.

## 2023-12-22 NOTE — Assessment & Plan Note (Signed)
 We will check her FLP as we changed her crestor  on her last visit.

## 2023-12-23 LAB — CBC WITH DIFFERENTIAL/PLATELET
Basophils Absolute: 0 10*3/uL (ref 0.0–0.2)
Basos: 0 %
EOS (ABSOLUTE): 0 10*3/uL (ref 0.0–0.4)
Eos: 0 %
Hematocrit: 38.1 % (ref 34.0–46.6)
Hemoglobin: 12.2 g/dL (ref 11.1–15.9)
Immature Grans (Abs): 0.2 10*3/uL — ABNORMAL HIGH (ref 0.0–0.1)
Immature Granulocytes: 2 %
Lymphocytes Absolute: 1.6 10*3/uL (ref 0.7–3.1)
Lymphs: 13 %
MCH: 30.7 pg (ref 26.6–33.0)
MCHC: 32 g/dL (ref 31.5–35.7)
MCV: 96 fL (ref 79–97)
Monocytes Absolute: 1 10*3/uL — ABNORMAL HIGH (ref 0.1–0.9)
Monocytes: 8 %
Neutrophils Absolute: 9.3 10*3/uL — ABNORMAL HIGH (ref 1.4–7.0)
Neutrophils: 77 %
Platelets: 477 10*3/uL — ABNORMAL HIGH (ref 150–450)
RBC: 3.98 x10E6/uL (ref 3.77–5.28)
RDW: 14.8 % (ref 11.7–15.4)
WBC: 12.1 10*3/uL — ABNORMAL HIGH (ref 3.4–10.8)

## 2023-12-23 LAB — RETICULOCYTES: Retic Ct Pct: 5.9 % — ABNORMAL HIGH (ref 0.6–2.6)

## 2023-12-23 LAB — FERRITIN: Ferritin: 35 ng/mL (ref 15–150)

## 2023-12-23 LAB — IRON: Iron: 48 ug/dL (ref 27–139)

## 2023-12-28 DIAGNOSIS — L130 Dermatitis herpetiformis: Secondary | ICD-10-CM | POA: Diagnosis not present

## 2023-12-30 ENCOUNTER — Ambulatory Visit: Payer: Self-pay

## 2023-12-30 NOTE — Progress Notes (Signed)
 Patient called.  Patient aware.  I have called and informed the patient  Her labs look good.  Her iron levels are normal.   Pt aware.

## 2023-12-31 ENCOUNTER — Encounter: Payer: Self-pay | Admitting: Specialist

## 2024-01-02 ENCOUNTER — Ambulatory Visit: Payer: Self-pay | Admitting: Cardiology

## 2024-01-04 ENCOUNTER — Telehealth: Payer: Self-pay

## 2024-01-04 NOTE — Telephone Encounter (Signed)
 Left message on My Chart with Echo results per Dr. Vanetta Shawl note. Routed to PCP.

## 2024-01-18 ENCOUNTER — Other Ambulatory Visit: Payer: Self-pay

## 2024-01-18 MED ORDER — ROSUVASTATIN CALCIUM 10 MG PO TABS: 20.0000 mg | ORAL_TABLET | Freq: Every evening | ORAL | 1 refills | Status: DC

## 2024-01-18 NOTE — Progress Notes (Signed)
 Rx refill

## 2024-01-24 ENCOUNTER — Other Ambulatory Visit: Payer: Self-pay | Admitting: Internal Medicine

## 2024-01-25 ENCOUNTER — Other Ambulatory Visit: Payer: Self-pay

## 2024-03-24 ENCOUNTER — Ambulatory Visit: Admitting: Internal Medicine

## 2024-03-24 ENCOUNTER — Encounter: Payer: Self-pay | Admitting: Internal Medicine

## 2024-03-24 VITALS — BP 148/80 | HR 63 | Temp 97.6°F | Resp 16 | Ht 64.0 in | Wt 201.0 lb

## 2024-03-24 DIAGNOSIS — E7800 Pure hypercholesterolemia, unspecified: Secondary | ICD-10-CM

## 2024-03-24 DIAGNOSIS — I10 Essential (primary) hypertension: Secondary | ICD-10-CM | POA: Diagnosis not present

## 2024-03-24 DIAGNOSIS — F411 Generalized anxiety disorder: Secondary | ICD-10-CM

## 2024-03-24 DIAGNOSIS — F410 Panic disorder [episodic paroxysmal anxiety] without agoraphobia: Secondary | ICD-10-CM | POA: Diagnosis not present

## 2024-03-24 DIAGNOSIS — Z23 Encounter for immunization: Secondary | ICD-10-CM

## 2024-03-24 DIAGNOSIS — E039 Hypothyroidism, unspecified: Secondary | ICD-10-CM

## 2024-03-24 DIAGNOSIS — F331 Major depressive disorder, recurrent, moderate: Secondary | ICD-10-CM

## 2024-03-24 MED ORDER — CLONAZEPAM 0.5 MG PO TABS: 1.0000 mg | ORAL_TABLET | Freq: Two times a day (BID) | ORAL | 1 refills | Status: DC | PRN

## 2024-03-24 MED ORDER — CARISOPRODOL 350 MG PO TABS: 350.0000 mg | ORAL_TABLET | Freq: Three times a day (TID) | ORAL | 2 refills | Status: DC | PRN

## 2024-03-24 MED ORDER — BUPROPION HCL ER (XL) 300 MG PO TB24: 300.0000 mg | ORAL_TABLET | Freq: Every morning | ORAL | 1 refills | Status: DC

## 2024-03-24 NOTE — Assessment & Plan Note (Signed)
 WE will check her FLP since it was not done on her last visit.

## 2024-03-24 NOTE — Progress Notes (Signed)
 Office Visit  Subjective   Patient ID: Morgan [name]   DOB: [birth year]   Age: 63 y.o.   MRN: [id number]   Chief Complaint Chief Complaint  Patient presents with   Follow-up    3 Month follow up     History of Present Illness Morgan [name] is a 63 yo female who comes in today to discussion anxiety and depression.  I saw her 3 months ago where her depression and anxiety had worsened.  Over the interim, her father who was a patient of mine passed away and a lot of her mood is situational.  She is now living with her mother.  On her last visit, I did increase her cymbalta  to 60mg  BID and increased her klonopin  to 1mg  BID prn.  She usually takes one tab daily but does not always use 2 tabs per day.  She has a history of anxiety where she is clonazepam  0.5mg  BID as well as wellbutrin  XL 300mg  daily and she is on cymbalta  60mg  daily.  She states she is having panic attacks 1-3 times per day.  She is having some insomnia, problems with concentration, decreased appetite, with feelings of guilt and worthlessness.  The patient is a 63 year old Caucasian/White female who presents for a follow-up evaluation of hypertension.   She had some autonomic instablity since COVID-19 with her BP in the past.  Cardiology has seen her and reduced her amlodipine  from 10mg  to 5mg  daily and she last cardiology on 05/06/2023.  They did discontinue her amlodipine  at that time and they are discussing switching her metoprolol  to long acting metoprolol .  The patient has  been checking her blood pressure at home.  Her systolic BP has been running 120's range.  The patient's current medications include: metoprolol  tartrate 25 mg twice daily. The patient has been tolerating her medications well. The patient denies any visual changes, chest pain, shortness of breath, and weakness/numbness. She reports there have been no other symptoms noted.  Morgan [name] returns today for routine followup on her cholesterol. We did lab testing in  06/2024 and her cholesterol was not controlled.  We did increase her crestor  from 10mg  to 20mg  daily and we were supposed to have obtained a FLP on her last visit 3 months ago but somehow that was not done.  Overall, she states she is doing well and is without any complaints or problems at this time. She specifically denies myalgias and fatigue. She remains on dietary management as well as the following cholesterol lowering medications Crestor  oral tablet 20 mg at bedtime. She is fasting in anticipation for labs today.   The patient is a 63 year old Caucasian/White female who returns for a regularly scheduled thyroid  check.  She is currently on levothyroxine 100mcg Mon-Sat and 200mcg on Sundays.  She claims to have no symptoms suggestive of thyroid  imbalance specifically denying cold intolerance, heat intolerance, tremors, unexplained weight changes.  She was having some dry mouth episodes where she saw her rheumatologist and they ruled out Sjogrens syndrome with negative SSA/SSB antibodies.  I reviewed their notes and they felt her dry mouth was due to her amitriptyline.     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   Allergy    seasonal   Anemia    Anxiety    Arthritis    Asthma    Asthma 05/20/2007   Qualifier: Diagnosis of   By: Randeen MD, Laine Caldron  IMO SNOMED Dx Update Oct 2024     Back pain 12/04/2011   Carpal tunnel syndrome of left wrist 12/03/2021   Celiac disease    Chest pain 08/26/2022   Chronic cough 03/08/2019   Connective tissue disorder (HCC) 08/17/2014   Depression    Dermatitis herpetiformis 08/26/2011   Disorder of skin or subcutaneous tissue 07/11/2010   Qualifier: Diagnosis of   By: Randeen MD, Laine Caldron     IMO SNOMED Dx Update Oct 2024     DOE (dyspnea on exertion) 10/26/2022   Dry eye syndrome of both eyes 06/24/2020   Dysphonia 03/08/2019   Dyspnea 08/26/2022   Essential hypertension 03/22/2023    Fatigue 08/26/2011   Fibromyalgia    Fibula fracture    GAD (generalized anxiety disorder) 09/21/2007   Sees psychiatrist - has had inpt tx         GERD 05/20/2007   Qualifier: Diagnosis of   By: Randeen MD, Laine Caldron        GERD (gastroesophageal reflux disease)    Headache    Hemorrhoids 05/20/2007   Qualifier: Diagnosis of   By: Randeen MD, Laine Caldron     IMO SNOMED Dx Update Oct 2024     Herpes simplex virus (HSV) infection 05/20/2007   IMO SNOMED Dx Update Oct 2024     History of asthma 03/08/2019   History of balance disorder 12/04/2011   HLD (hyperlipidemia)    Hx of migraines    Hypertension    Hypothyroidism    Hypoxia 01/20/2023   Insomnia 06/24/2022   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   Joint pain 08/26/2011   Seeing Dr Bernadine for fibromyalgia and possible SLE or other autoimmune variant  Also bursitis of hips and some OA     Laryngospasms 03/08/2019   Leg pain 12/04/2011   Lupus    Mild episode of recurrent major depressive disorder (HCC) 07/24/2022   Moderate episode of recurrent major depressive disorder (HCC) 03/22/2023   Myalgia and myositis, unspecified    Neck pain 12/04/2011   Numbness 03/08/2014   Other B-complex deficiencies    Palpitations 07/24/2022   Prediabetes 03/22/2023   PTSD (post-traumatic stress disorder)    Recurrent HSV (herpes simplex virus)    Sacroiliitis (HCC) 05/26/2012   Shoulder impingement syndrome, left 10/22/2021   Strain of left trapezius muscle 04/10/2014   Thoracic outlet syndrome    Thoracic outlet syndrome of left thoracic outlet 05/22/2015   Thyroiditis    Thyrotoxicosis 05/20/2007   Qualifier: Diagnosis of   By: Randeen MD, Laine Caldron     IMO SNOMED Dx Update Oct 2024     Tinea corporis 08/04/2011   Unspecified hemorrhoids without mention of complication    Varicose veins of bilateral lower extremities with other complications 07/11/2010   Qualifier: Diagnosis of   By: Randeen MD, Laine Caldron     IMO SNOMED Dx Update Oct 2024      Varicose veins of lower extremities with other complications    Vertigo    Vitreous floater, bilateral 06/24/2020     Allergies Allergies  Allergen Reactions   Iodine Other (See Comments)    REACTION: SOB with injectable iodine   Iohexol  Shortness Of Breath   Gabapentin     Pregabalin  Other (See Comments)    Altered mental status     Medications  Current Outpatient Medications:    acetaminophen  (TYLENOL ) 500 MG tablet, Take 500 mg by mouth every 6 (six) hours as needed for mild pain or  moderate pain., Disp: , Rfl:    buPROPion  (WELLBUTRIN  XL) 300 MG 24 hr tablet, Take 1 tablet (300 mg total) by mouth every morning., Disp: 90 tablet, Rfl: 1   carisoprodol  (SOMA ) 350 MG tablet, Take 1 tablet (350 mg total) by mouth 3 (three) times daily as needed for muscle spasms., Disp: 90 tablet, Rfl: 2   clobetasol ointment (TEMOVATE) 0.05 %, Apply 1 application  topically 2 (two) times daily as needed (Rash)., Disp: , Rfl:    clonazePAM  (KLONOPIN ) 0.5 MG tablet, Take 2 tablets (1 mg total) by mouth 2 (two) times daily as needed for anxiety., Disp: 120 tablet, Rfl: 2   Cyanocobalamin  (B-12) 3000 MCG CAPS, Take 1 capsule by mouth daily., Disp: , Rfl:    dapsone 25 MG tablet, Take 25-100 mg by mouth daily as needed (for dermatitis). , Disp: , Rfl:    DULoxetine  (CYMBALTA ) 60 MG capsule, Take 1 capsule (60 mg total) by mouth 2 (two) times daily., Disp: 180 capsule, Rfl: 1   esomeprazole (NEXIUM) 40 MG capsule, Take 40 mg by mouth daily at 12 noon., Disp: , Rfl:    famotidine (PEPCID) 10 MG tablet, Take 1 tablet by mouth at bedtime., Disp: , Rfl:    fluticasone  (FLONASE ) 50 MCG/ACT nasal spray, Place 1 spray into both nostrils daily., Disp: , Rfl:    fluticasone -salmeterol (ADVAIR DISKUS) 250-50 MCG/ACT AEPB, Inhale 1 puff into the lungs in the morning and at bedtime., Disp: 60 each, Rfl: 5   folic acid  (FOLVITE ) 1 MG tablet, Take 1 mg by mouth daily., Disp: , Rfl:    ibuprofen (ADVIL,MOTRIN) 200 MG  tablet, Take 800 mg by mouth 2 (two) times daily as needed for headache or moderate pain., Disp: , Rfl:    levothyroxine (SYNTHROID) 100 MCG tablet, TAKE ONE TABLET BY MOUTH monday-saturday before breakfast and TAKE TWO TABLETS BY MOUTH ONCE WEEKLY BEFORE BREAKFAST ON SUNDAYS (Patient taking differently: Take 100 mcg by mouth daily before breakfast.), Disp: 90 tablet, Rfl: 1   loratadine (CLARITIN) 10 MG tablet, Take 10 mg by mouth daily.  , Disp: , Rfl:    metoprolol  tartrate (LOPRESSOR ) 25 MG tablet, TAKE 1 TABLET(25 MG) BY MOUTH TWICE DAILY, Disp: 180 tablet, Rfl: 1   pyridOXINE (VITAMIN B6) 50 MG tablet, Take 50 mg by mouth daily., Disp: , Rfl:    rosuvastatin  (CRESTOR ) 10 MG tablet, Take 2 tablets (20 mg total) by mouth at bedtime., Disp: 90 tablet, Rfl: 1   Sod Fluoride-Potassium Nitrate (SODIUM FLUORIDE 5000 SENSITIVE) 1.1-5 % GEL, Take 1 Application by mouth 2 (two) times daily., Disp: , Rfl:    thiamine (VITAMIN B-1) 100 MG tablet, Take 100 mg by mouth daily., Disp: , Rfl:    valACYclovir  (VALTREX ) 1000 MG tablet, TAKE 1 TABLET(1000 MG) BY MOUTH EVERY DAY, Disp: 30 tablet, Rfl: 1   valACYclovir  (VALTREX ) 500 MG tablet, TAKE 1 TO 2 TABLETS(500 TO 1000 MG) BY MOUTH DAILY AS NEEDED FOR OUTBREAK, Disp: 30 tablet, Rfl: 1   Review of Systems Review of Systems  Constitutional:  Negative for chills, fever, malaise/fatigue and weight loss.  Eyes:  Negative for blurred vision and double vision.  Respiratory:  Negative for cough and shortness of breath.   Cardiovascular:  Negative for chest pain, palpitations and leg swelling.  Gastrointestinal:  Negative for abdominal pain, constipation, diarrhea, heartburn, nausea and vomiting.  Genitourinary:  Negative for frequency.  Musculoskeletal:  Negative for myalgias.  Skin:  Negative for itching and rash.  Neurological:  Negative for dizziness, weakness and headaches.  Endo/Heme/Allergies:  Negative for polydipsia.       Objective:    Vitals BP  (!) 148/80   Pulse 63   Temp 97.6 F (36.4 C) (Oral)   Resp 16   Ht 5' 4 (1.626 m)   Wt 201 lb (91.2 kg)   SpO2 98%   BMI 34.50 kg/m    Physical Examination Physical Exam Constitutional:      Appearance: Normal appearance. She is not ill-appearing.  Cardiovascular:     Rate and Rhythm: Normal rate and regular rhythm.     Pulses: Normal pulses.     Heart sounds: No murmur heard.    No friction rub. No gallop.  Pulmonary:     Effort: Pulmonary effort is normal. No respiratory distress.     Breath sounds: No wheezing, rhonchi or rales.  Abdominal:     General: Bowel sounds are normal. There is no distension.     Palpations: Abdomen is soft.     Tenderness: There is no abdominal tenderness.  Musculoskeletal:     Right lower leg: No edema.     Left lower leg: No edema.  Skin:    General: Skin is warm and dry.     Findings: No rash.  Neurological:     General: No focal deficit present.     Mental Status: She is alert and oriented to person, place, and time.  Psychiatric:        Mood and Affect: Mood normal.        Behavior: Behavior normal.        Assessment & Plan:   Essential hypertension Her BP is up a bit but her BP has been controlled in her last few visits.  We will see what her BP is doing on her next visit.  Hypothyroidism She seems euthyroid. We will check her TFT's today.  HYPERLIPIDEMIA WE will check her FLP since it was not done on her last visit.  GAD (generalized anxiety disorder) Plan as below.  Moderate episode of recurrent major depressive disorder (HCC) She does not want to change any of her depression meds.  She states things are currently stable.      Return in about 3 months (around 06/23/2024).   Selinda Fleeta Finger, MD

## 2024-03-24 NOTE — Assessment & Plan Note (Signed)
 Her BP is up a bit but her BP has been controlled in her last few visits.  We will see what her BP is doing on her next visit.

## 2024-03-24 NOTE — Assessment & Plan Note (Signed)
 She does not want to change any of her depression meds.  She states things are currently stable.

## 2024-03-24 NOTE — Addendum Note (Signed)
 Addended by: LENETTA LACKS on: 03/24/2024 05:15 PM   Modules accepted: Orders

## 2024-03-24 NOTE — Assessment & Plan Note (Signed)
 Plan as below.

## 2024-03-24 NOTE — Assessment & Plan Note (Signed)
 She seems euthyroid.  We will check her TFT's today.

## 2024-03-25 LAB — CMP14 + ANION GAP
ALT: 14 IU/L (ref 0–32)
AST: 13 IU/L (ref 0–40)
Albumin: 3.8 g/dL — ABNORMAL LOW (ref 3.9–4.9)
Alkaline Phosphatase: 101 IU/L (ref 44–121)
Anion Gap: 13 mmol/L (ref 10.0–18.0)
BUN/Creatinine Ratio: 11 — ABNORMAL LOW (ref 12–28)
BUN: 10 mg/dL (ref 8–27)
Bilirubin Total: 0.3 mg/dL (ref 0.0–1.2)
CO2: 23 mmol/L (ref 20–29)
Calcium: 9.2 mg/dL (ref 8.7–10.3)
Chloride: 103 mmol/L (ref 96–106)
Creatinine, Ser: 0.92 mg/dL (ref 0.57–1.00)
Globulin, Total: 2.6 g/dL (ref 1.5–4.5)
Glucose: 86 mg/dL (ref 70–99)
Potassium: 4.7 mmol/L (ref 3.5–5.2)
Sodium: 139 mmol/L (ref 134–144)
Total Protein: 6.4 g/dL (ref 6.0–8.5)
eGFR: 71 mL/min/1.73 (ref >59)

## 2024-03-25 LAB — LIPID PANEL
Chol/HDL Ratio: 3.5 ratio (ref 0.0–4.4)
Cholesterol, Total: 151 mg/dL (ref 100–199)
HDL: 43 mg/dL (ref >39)
LDL Chol Calc (NIH): 86 mg/dL (ref 0–99)
Triglycerides: 121 mg/dL (ref 0–149)
VLDL Cholesterol Cal: 22 mg/dL (ref 5–40)

## 2024-03-25 LAB — T4, FREE: Free T4: 1.02 ng/dL (ref 0.82–1.77)

## 2024-03-25 LAB — TSH: TSH: 14.2 u[IU]/mL — ABNORMAL HIGH (ref 0.450–4.500)

## 2024-03-30 ENCOUNTER — Other Ambulatory Visit: Payer: Self-pay | Admitting: Internal Medicine

## 2024-03-30 MED ORDER — HYDROCOD POLI-CHLORPHE POLI ER 10-8 MG/5ML PO SUER: 5.0000 mL | Freq: Two times a day (BID) | ORAL | 0 refills | Status: DC | PRN

## 2024-04-27 ENCOUNTER — Ambulatory Visit: Payer: Self-pay

## 2024-04-27 ENCOUNTER — Other Ambulatory Visit: Payer: Self-pay

## 2024-04-27 ENCOUNTER — Other Ambulatory Visit: Payer: Self-pay | Admitting: Internal Medicine

## 2024-04-27 MED ORDER — LEVOTHYROXINE SODIUM 100 MCG PO TABS: 100.0000 ug | ORAL_TABLET | Freq: Every day | ORAL | 1 refills | Status: DC

## 2024-04-27 MED ORDER — LEVOTHYROXINE SODIUM 200 MCG PO TABS: 200.0000 ug | ORAL_TABLET | Freq: Every day | ORAL | 3 refills | Status: AC

## 2024-04-27 NOTE — Progress Notes (Signed)
 New Rx and Rx refill. Dr. Fleeta Finger stated  I want her to change her levothyroxine to 100mcg Mon-Fri and 200mcg on Sat and Sun

## 2024-04-27 NOTE — Progress Notes (Signed)
 Patient called.  Patient aware.

## 2024-04-28 ENCOUNTER — Other Ambulatory Visit: Payer: Self-pay | Admitting: Internal Medicine

## 2024-05-29 ENCOUNTER — Other Ambulatory Visit: Payer: Self-pay | Admitting: Internal Medicine

## 2024-05-29 MED ORDER — HYDROCOD POLI-CHLORPHE POLI ER 10-8 MG/5ML PO SUER: 5.0000 mL | Freq: Two times a day (BID) | ORAL | 0 refills | Status: DC | PRN

## 2024-06-21 ENCOUNTER — Ambulatory Visit

## 2024-06-21 ENCOUNTER — Ambulatory Visit: Admitting: Internal Medicine

## 2024-06-21 VITALS — BP 140/82 | HR 76 | Temp 97.5°F | Resp 18 | Ht 64.0 in | Wt 208.6 lb

## 2024-06-21 DIAGNOSIS — F411 Generalized anxiety disorder: Secondary | ICD-10-CM | POA: Diagnosis not present

## 2024-06-21 DIAGNOSIS — M62838 Other muscle spasm: Secondary | ICD-10-CM | POA: Diagnosis not present

## 2024-06-21 DIAGNOSIS — F331 Major depressive disorder, recurrent, moderate: Secondary | ICD-10-CM

## 2024-06-21 DIAGNOSIS — R053 Chronic cough: Secondary | ICD-10-CM | POA: Diagnosis not present

## 2024-06-21 DIAGNOSIS — Z0000 Encounter for general adult medical examination without abnormal findings: Secondary | ICD-10-CM | POA: Diagnosis not present

## 2024-06-21 DIAGNOSIS — E7800 Pure hypercholesterolemia, unspecified: Secondary | ICD-10-CM

## 2024-06-21 MED ORDER — CARISOPRODOL 350 MG PO TABS: 350.0000 mg | ORAL_TABLET | Freq: Three times a day (TID) | ORAL | 2 refills | Status: AC | PRN

## 2024-06-21 MED ORDER — CLONAZEPAM 0.5 MG PO TABS: 1.0000 mg | ORAL_TABLET | Freq: Two times a day (BID) | ORAL | 1 refills | Status: AC | PRN

## 2024-06-21 MED ORDER — HYDROCOD POLI-CHLORPHE POLI ER 10-8 MG/5ML PO SUER: 5.0000 mL | Freq: Two times a day (BID) | ORAL | 0 refills | Status: AC | PRN

## 2024-06-21 MED ORDER — BUPROPION HCL ER (XL) 300 MG PO TB24: 300.0000 mg | ORAL_TABLET | Freq: Every morning | ORAL | 1 refills | Status: AC

## 2024-06-21 MED ORDER — ROSUVASTATIN CALCIUM 10 MG PO TABS: 20.0000 mg | ORAL_TABLET | Freq: Every evening | ORAL | 1 refills | Status: AC

## 2024-06-21 NOTE — Progress Notes (Signed)
° °  Established Patient Office Visit  Subjective   Patient ID: Morgan [name], female    DOB: 01-02-1961  Age: 63 y.o. MRN: [id number]  Chief Complaint  Patient presents with   Follow-up    3 Month follow up    Patient presents in office today for routine follow up visit. She reports she is doing well minus a cough which she states is neurogenic as the result of having whooping cough as a child. She takes tussinex for it and would like a refill of that medication. She is requesting refill of her normal medication this visit as well.      Review of Systems  Constitutional: Negative.   HENT: Negative.    Respiratory:  Positive for cough.   Cardiovascular: Negative.   Skin: Negative.   Neurological: Negative.   Psychiatric/Behavioral: Negative.        Objective:     BP (!) 140/82   Pulse 76   Temp (!) 97.5 F (36.4 C) (Temporal)   Resp 18   Ht 5' 4 (1.626 m)   Wt 208 lb 9.6 oz (94.6 kg)   SpO2 96%   BMI 35.81 kg/m    Physical Exam Constitutional:      Appearance: Normal appearance.  Cardiovascular:     Rate and Rhythm: Normal rate and regular rhythm.     Pulses: Normal pulses.     Heart sounds: Normal heart sounds.  Pulmonary:     Effort: Pulmonary effort is normal.     Breath sounds: Examination of the right-lower field reveals decreased breath sounds. Examination of the left-lower field reveals decreased breath sounds. Decreased breath sounds present.  Musculoskeletal:        General: Normal range of motion.     Cervical back: Normal range of motion.  Skin:    General: Skin is warm and dry.  Neurological:     General: No focal deficit present.     Mental Status: She is alert and oriented to person, place, and time.  Psychiatric:        Mood and Affect: Mood normal.      No results found for any visits on 06/21/24.    The 10-year ASCVD risk score (Arnett DK, et al., 2019) is: 5.6%    Assessment & Plan:   Problem List Items Addressed This Visit    None   No follow-ups on file.    Austine Cork, FNP

## 2024-08-03 ENCOUNTER — Other Ambulatory Visit: Payer: Self-pay

## 2024-08-03 MED ORDER — FLUTICASONE-SALMETEROL 250-50 MCG/ACT IN AEPB: 1.0000 | INHALATION_SPRAY | Freq: Two times a day (BID) | RESPIRATORY_TRACT | 5 refills | Status: AC

## 2024-08-04 ENCOUNTER — Encounter: Payer: Self-pay | Admitting: *Deleted

## 2024-08-04 NOTE — Progress Notes (Signed)
 Morgan [name]                                          MRN: [id number]   08/04/2024   The [facility] Quality Team Specialist reviewed this patient medical record for the purposes of chart review for care gap closure. The following were reviewed: chart review for care gap closure-controlling blood pressure.    [facility] Quality Team

## 2024-09-20 ENCOUNTER — Ambulatory Visit: Admitting: Internal Medicine

## 2024-09-20 ENCOUNTER — Ambulatory Visit
